# Patient Record
Sex: Female | Born: 1950 | Race: White | Hispanic: No | Marital: Married | Smoking: Former smoker
Health system: Southern US, Community
[De-identification: ages and names within clinical notes are randomized; demographics above are authoritative.]

## PROBLEM LIST (undated history)

## (undated) DIAGNOSIS — I10 Essential (primary) hypertension: Secondary | ICD-10-CM

## (undated) DIAGNOSIS — L409 Psoriasis, unspecified: Secondary | ICD-10-CM

## (undated) DIAGNOSIS — D126 Benign neoplasm of colon, unspecified: Secondary | ICD-10-CM

## (undated) DIAGNOSIS — Z8489 Family history of other specified conditions: Secondary | ICD-10-CM

## (undated) DIAGNOSIS — Q820 Hereditary lymphedema: Secondary | ICD-10-CM

## (undated) DIAGNOSIS — Z87898 Personal history of other specified conditions: Secondary | ICD-10-CM

## (undated) DIAGNOSIS — E119 Type 2 diabetes mellitus without complications: Secondary | ICD-10-CM

## (undated) DIAGNOSIS — M5136 Other intervertebral disc degeneration, lumbar region: Secondary | ICD-10-CM

## (undated) DIAGNOSIS — I83893 Varicose veins of bilateral lower extremities with other complications: Secondary | ICD-10-CM

## (undated) DIAGNOSIS — E785 Hyperlipidemia, unspecified: Secondary | ICD-10-CM

## (undated) DIAGNOSIS — I700 Atherosclerosis of aorta: Secondary | ICD-10-CM

## (undated) DIAGNOSIS — Z9842 Cataract extraction status, left eye: Secondary | ICD-10-CM

## (undated) DIAGNOSIS — J189 Pneumonia, unspecified organism: Secondary | ICD-10-CM

## (undated) DIAGNOSIS — K644 Residual hemorrhoidal skin tags: Secondary | ICD-10-CM

## (undated) DIAGNOSIS — M48061 Spinal stenosis, lumbar region without neurogenic claudication: Secondary | ICD-10-CM

## (undated) DIAGNOSIS — Z87442 Personal history of urinary calculi: Secondary | ICD-10-CM

## (undated) DIAGNOSIS — M1990 Unspecified osteoarthritis, unspecified site: Secondary | ICD-10-CM

## (undated) DIAGNOSIS — Z9889 Other specified postprocedural states: Secondary | ICD-10-CM

## (undated) DIAGNOSIS — R112 Nausea with vomiting, unspecified: Secondary | ICD-10-CM

## (undated) DIAGNOSIS — M51369 Other intervertebral disc degeneration, lumbar region without mention of lumbar back pain or lower extremity pain: Secondary | ICD-10-CM

## (undated) DIAGNOSIS — F419 Anxiety disorder, unspecified: Secondary | ICD-10-CM

## (undated) DIAGNOSIS — Z9841 Cataract extraction status, right eye: Secondary | ICD-10-CM

## (undated) DIAGNOSIS — F329 Major depressive disorder, single episode, unspecified: Secondary | ICD-10-CM

## (undated) DIAGNOSIS — K5790 Diverticulosis of intestine, part unspecified, without perforation or abscess without bleeding: Secondary | ICD-10-CM

## (undated) DIAGNOSIS — Z952 Presence of prosthetic heart valve: Secondary | ICD-10-CM

## (undated) DIAGNOSIS — F32A Depression, unspecified: Secondary | ICD-10-CM

## (undated) DIAGNOSIS — R7303 Prediabetes: Secondary | ICD-10-CM

## (undated) HISTORY — DX: Psoriasis, unspecified: L40.9

## (undated) HISTORY — DX: Essential (primary) hypertension: I10

## (undated) HISTORY — PX: CARDIAC VALVE REPLACEMENT: SHX585

---

## 1999-08-22 DIAGNOSIS — R112 Nausea with vomiting, unspecified: Secondary | ICD-10-CM

## 1999-08-22 DIAGNOSIS — Z9889 Other specified postprocedural states: Secondary | ICD-10-CM

## 1999-08-22 HISTORY — DX: Nausea with vomiting, unspecified: R11.2

## 1999-08-22 HISTORY — DX: Other specified postprocedural states: Z98.890

## 2004-10-19 ENCOUNTER — Ambulatory Visit: Payer: Self-pay | Admitting: Urology

## 2005-05-04 ENCOUNTER — Ambulatory Visit: Payer: Self-pay | Admitting: Internal Medicine

## 2006-09-20 ENCOUNTER — Ambulatory Visit: Payer: Self-pay | Admitting: Internal Medicine

## 2007-10-07 ENCOUNTER — Ambulatory Visit: Payer: Self-pay | Admitting: Internal Medicine

## 2011-08-22 DIAGNOSIS — Z960 Presence of urogenital implants: Secondary | ICD-10-CM

## 2011-08-22 HISTORY — PX: OTHER SURGICAL HISTORY: SHX169

## 2011-08-22 HISTORY — DX: Presence of urogenital implants: Z96.0

## 2012-08-21 HISTORY — PX: JOINT REPLACEMENT: SHX530

## 2012-08-21 HISTORY — PX: CATARACT EXTRACTION W/ INTRAOCULAR LENS IMPLANT: SHX1309

## 2012-08-21 HISTORY — PX: OTHER SURGICAL HISTORY: SHX169

## 2014-01-28 DIAGNOSIS — F419 Anxiety disorder, unspecified: Secondary | ICD-10-CM | POA: Insufficient documentation

## 2014-01-28 DIAGNOSIS — F32A Depression, unspecified: Secondary | ICD-10-CM | POA: Insufficient documentation

## 2014-01-28 DIAGNOSIS — L409 Psoriasis, unspecified: Secondary | ICD-10-CM | POA: Insufficient documentation

## 2014-06-16 DIAGNOSIS — R7303 Prediabetes: Secondary | ICD-10-CM | POA: Insufficient documentation

## 2014-08-18 ENCOUNTER — Ambulatory Visit: Payer: Self-pay | Admitting: Internal Medicine

## 2014-08-21 DIAGNOSIS — K5790 Diverticulosis of intestine, part unspecified, without perforation or abscess without bleeding: Secondary | ICD-10-CM

## 2014-08-21 HISTORY — DX: Diverticulosis of intestine, part unspecified, without perforation or abscess without bleeding: K57.90

## 2014-09-10 ENCOUNTER — Ambulatory Visit: Payer: Self-pay | Admitting: Internal Medicine

## 2014-10-16 ENCOUNTER — Ambulatory Visit: Payer: Self-pay | Admitting: Gastroenterology

## 2014-12-14 LAB — SURGICAL PATHOLOGY

## 2015-06-01 DIAGNOSIS — R928 Other abnormal and inconclusive findings on diagnostic imaging of breast: Secondary | ICD-10-CM | POA: Insufficient documentation

## 2015-06-01 DIAGNOSIS — R32 Unspecified urinary incontinence: Secondary | ICD-10-CM | POA: Insufficient documentation

## 2015-06-03 ENCOUNTER — Other Ambulatory Visit: Payer: Self-pay | Admitting: Internal Medicine

## 2015-06-03 DIAGNOSIS — R928 Other abnormal and inconclusive findings on diagnostic imaging of breast: Secondary | ICD-10-CM

## 2015-06-21 ENCOUNTER — Other Ambulatory Visit: Payer: Self-pay | Admitting: Internal Medicine

## 2015-06-21 ENCOUNTER — Ambulatory Visit
Admission: RE | Admit: 2015-06-21 | Discharge: 2015-06-21 | Disposition: A | Discharge status: Home / Self Care | Payer: Medicare Other | Source: Ambulatory Visit | Attending: Internal Medicine | Admitting: Internal Medicine

## 2015-06-21 DIAGNOSIS — R928 Other abnormal and inconclusive findings on diagnostic imaging of breast: Secondary | ICD-10-CM

## 2015-06-21 DIAGNOSIS — R921 Mammographic calcification found on diagnostic imaging of breast: Secondary | ICD-10-CM | POA: Insufficient documentation

## 2015-06-22 ENCOUNTER — Other Ambulatory Visit: Payer: Self-pay | Admitting: Internal Medicine

## 2015-06-23 ENCOUNTER — Other Ambulatory Visit: Payer: Self-pay | Admitting: Internal Medicine

## 2015-06-23 DIAGNOSIS — R921 Mammographic calcification found on diagnostic imaging of breast: Secondary | ICD-10-CM

## 2015-08-22 DIAGNOSIS — Q820 Hereditary lymphedema: Secondary | ICD-10-CM

## 2015-08-22 HISTORY — DX: Hereditary lymphedema: Q82.0

## 2015-12-20 ENCOUNTER — Ambulatory Visit: Payer: Medicare Other | Attending: Internal Medicine

## 2015-12-20 ENCOUNTER — Other Ambulatory Visit: Payer: Medicare Other

## 2016-01-07 ENCOUNTER — Ambulatory Visit
Admission: RE | Admit: 2016-01-07 | Discharge: 2016-01-07 | Disposition: A | Discharge status: Home / Self Care | Payer: Medicare Other | Source: Ambulatory Visit | Attending: Internal Medicine | Admitting: Internal Medicine

## 2016-01-07 ENCOUNTER — Other Ambulatory Visit: Payer: Self-pay | Admitting: Internal Medicine

## 2016-01-07 DIAGNOSIS — R921 Mammographic calcification found on diagnostic imaging of breast: Secondary | ICD-10-CM

## 2016-03-25 DIAGNOSIS — I83893 Varicose veins of bilateral lower extremities with other complications: Secondary | ICD-10-CM | POA: Insufficient documentation

## 2016-08-21 HISTORY — PX: PARS PLANA VITRECTOMY: SHX2166

## 2016-08-21 HISTORY — PX: EYE SURGERY: SHX253

## 2016-09-20 DIAGNOSIS — Z96659 Presence of unspecified artificial knee joint: Secondary | ICD-10-CM | POA: Insufficient documentation

## 2016-09-21 ENCOUNTER — Other Ambulatory Visit: Payer: Self-pay | Admitting: Internal Medicine

## 2016-09-26 ENCOUNTER — Other Ambulatory Visit: Payer: Self-pay | Admitting: Internal Medicine

## 2016-09-26 DIAGNOSIS — R921 Mammographic calcification found on diagnostic imaging of breast: Secondary | ICD-10-CM

## 2016-10-02 ENCOUNTER — Other Ambulatory Visit: Payer: Self-pay | Admitting: Internal Medicine

## 2016-10-02 DIAGNOSIS — E7800 Pure hypercholesterolemia, unspecified: Secondary | ICD-10-CM | POA: Insufficient documentation

## 2016-10-02 DIAGNOSIS — R921 Mammographic calcification found on diagnostic imaging of breast: Secondary | ICD-10-CM

## 2016-10-23 ENCOUNTER — Telehealth (INDEPENDENT_AMBULATORY_CARE_PROVIDER_SITE_OTHER): Payer: Self-pay

## 2016-10-23 NOTE — Telephone Encounter (Signed)
Patient called and said she had a total knee replacement and wanted to know should she use her lymph pump during this time. Per Hezzie Bump the P.A. She can wear compression hose and elevate above heart level.

## 2016-10-27 ENCOUNTER — Ambulatory Visit: Payer: Medicare Other

## 2016-10-27 HISTORY — PX: JOINT REPLACEMENT: SHX530

## 2016-10-30 ENCOUNTER — Ambulatory Visit (INDEPENDENT_AMBULATORY_CARE_PROVIDER_SITE_OTHER): Payer: Self-pay | Admitting: Vascular Surgery

## 2016-11-27 ENCOUNTER — Encounter
Admission: RE | Admit: 2016-11-27 | Discharge: 2016-11-27 | Disposition: A | Discharge status: Home / Self Care | Payer: Medicare Other | Source: Ambulatory Visit | Attending: Unknown Physician Specialty | Admitting: Unknown Physician Specialty

## 2016-11-27 ENCOUNTER — Other Ambulatory Visit: Payer: Medicare Other

## 2016-11-27 DIAGNOSIS — F4321 Adjustment disorder with depressed mood: Secondary | ICD-10-CM | POA: Diagnosis not present

## 2016-11-27 DIAGNOSIS — E785 Hyperlipidemia, unspecified: Secondary | ICD-10-CM | POA: Diagnosis not present

## 2016-11-27 DIAGNOSIS — Z87891 Personal history of nicotine dependence: Secondary | ICD-10-CM | POA: Diagnosis not present

## 2016-11-27 DIAGNOSIS — I10 Essential (primary) hypertension: Secondary | ICD-10-CM | POA: Diagnosis not present

## 2016-11-27 DIAGNOSIS — M24661 Ankylosis, right knee: Secondary | ICD-10-CM | POA: Diagnosis not present

## 2016-11-27 DIAGNOSIS — Z79899 Other long term (current) drug therapy: Secondary | ICD-10-CM | POA: Diagnosis not present

## 2016-11-27 DIAGNOSIS — F419 Anxiety disorder, unspecified: Secondary | ICD-10-CM | POA: Diagnosis not present

## 2016-11-27 DIAGNOSIS — Z96653 Presence of artificial knee joint, bilateral: Secondary | ICD-10-CM | POA: Diagnosis not present

## 2016-11-27 DIAGNOSIS — Z7901 Long term (current) use of anticoagulants: Secondary | ICD-10-CM | POA: Diagnosis not present

## 2016-11-27 HISTORY — DX: Anxiety disorder, unspecified: F41.9

## 2016-11-27 HISTORY — DX: Personal history of urinary calculi: Z87.442

## 2016-11-27 HISTORY — DX: Psoriasis, unspecified: L40.9

## 2016-11-27 HISTORY — DX: Nausea with vomiting, unspecified: R11.2

## 2016-11-27 HISTORY — DX: Essential (primary) hypertension: I10

## 2016-11-27 HISTORY — DX: Hereditary lymphedema: Q82.0

## 2016-11-27 HISTORY — DX: Diverticulosis of intestine, part unspecified, without perforation or abscess without bleeding: K57.90

## 2016-11-27 HISTORY — DX: Hyperlipidemia, unspecified: E78.5

## 2016-11-27 HISTORY — DX: Benign neoplasm of colon, unspecified: D12.6

## 2016-11-27 HISTORY — DX: Residual hemorrhoidal skin tags: K64.4

## 2016-11-27 HISTORY — DX: Unspecified osteoarthritis, unspecified site: M19.90

## 2016-11-27 HISTORY — DX: Prediabetes: R73.03

## 2016-11-27 HISTORY — DX: Family history of other specified conditions: Z84.89

## 2016-11-27 HISTORY — DX: Other specified postprocedural states: Z98.890

## 2016-11-27 LAB — BASIC METABOLIC PANEL
Anion gap: 8 (ref 5–15)
BUN: 12 mg/dL (ref 6–20)
CO2: 25 mmol/L (ref 22–32)
Calcium: 8.8 mg/dL — ABNORMAL LOW (ref 8.9–10.3)
Chloride: 106 mmol/L (ref 101–111)
Creatinine, Ser: 0.78 mg/dL (ref 0.44–1.00)
GFR calc Af Amer: >60 mL/min (ref >60)
GFR calc non Af Amer: >60 mL/min (ref >60)
Glucose, Bld: 133 mg/dL — ABNORMAL HIGH (ref 65–99)
Potassium: 3.8 mmol/L (ref 3.5–5.1)
Sodium: 139 mmol/L (ref 135–145)

## 2016-11-27 NOTE — Patient Instructions (Signed)
  Your procedure is scheduled on:Wednesday April 11 , 2018. Report to Same Day Surgery. To find out your arrival time please call [phone] between 1PM - 3PM on Tuesday November 28, 2016.  Remember: Instructions that are not followed completely may result in serious medical risk, up to and including death, or upon the discretion of your surgeon and anesthesiologist your surgery may need to be rescheduled.    _x___ 1. Do not eat food or drink liquids after midnight. No gum chewing or hard candies.     __x__ 2. No Alcohol for 24 hours before or after surgery.   ____ 3. Bring all medications with you on the day of surgery if instructed.    __x__ 4. Notify your doctor if there is any change in your medical condition     (cold, fever, infections).    _____ 5. No smoking 24 hours prior to surgery.     Do not wear jewelry, make-up, hairpins, clips or nail polish.  Do not wear lotions, powders, or perfumes.   Do not shave 48 hours prior to surgery. Men may shave face and neck.  Do not bring valuables to the hospital.    [facility] is not responsible for any belongings or valuables.               Contacts, dentures or bridgework may not be worn into surgery.  Leave your suitcase in the car. After surgery it may be brought to your room.  For patients admitted to the hospital, discharge time is determined by your treatment team.   Patients discharged the day of surgery will not be allowed to drive home.    Please read over the following fact sheets that you were given:   [facility] Preparing for Surgery  __x__ Take these medicines the morning of surgery with A SIP OF WATER:    1. lisinopril (PRINIVIL,ZESTRIL)  2. metoprolol succinate (TOPROL-XL)  3. oxybutynin (DITROPAN)  4. venlafaxine (EFFEXOR)   ____ Fleet Enema (as directed)   ____ Use CHG Soap as directed on instruction sheet  ____ Use inhalers on the day of surgery and bring to hospital day of surgery  ____ Stop metformin  2 days prior to surgery    ____ Take 1/2 of usual insulin dose the night before surgery and none on the morning of  surgery.   ____ Stop Coumadin/Plavix/aspirin on does not apply.  __x__ Stop Anti-inflammatories such as Advil, Aleve, Ibuprofen, Motrin, Naproxen, Naprosyn, Goodies powders or aspirin products. OK to take Tylenol or  oxycodone.   ____ Stop supplements until after surgery.    ____ Bring C-Pap to the hospital.

## 2016-11-28 MED ORDER — FAMOTIDINE 20 MG PO TABS: 20.0000 mg | ORAL_TABLET | Freq: Once | ORAL | Status: DC

## 2016-11-29 ENCOUNTER — Encounter: Payer: Self-pay | Admitting: *Deleted

## 2016-11-29 ENCOUNTER — Ambulatory Visit
Admission: RE | Admit: 2016-11-29 | Discharge: 2016-11-29 | Disposition: A | Discharge status: Home / Self Care | Payer: Medicare Other | Source: Ambulatory Visit | Attending: Unknown Physician Specialty | Admitting: Unknown Physician Specialty

## 2016-11-29 ENCOUNTER — Encounter
Admission: RE | Disposition: A | Discharge status: Home / Self Care | Payer: Self-pay | Source: Ambulatory Visit | Attending: Unknown Physician Specialty

## 2016-11-29 ENCOUNTER — Ambulatory Visit: Payer: Medicare Other | Admitting: Anesthesiology

## 2016-11-29 DIAGNOSIS — M24661 Ankylosis, right knee: Secondary | ICD-10-CM | POA: Diagnosis not present

## 2016-11-29 DIAGNOSIS — Z7901 Long term (current) use of anticoagulants: Secondary | ICD-10-CM | POA: Insufficient documentation

## 2016-11-29 DIAGNOSIS — Z79899 Other long term (current) drug therapy: Secondary | ICD-10-CM | POA: Insufficient documentation

## 2016-11-29 DIAGNOSIS — E785 Hyperlipidemia, unspecified: Secondary | ICD-10-CM | POA: Insufficient documentation

## 2016-11-29 DIAGNOSIS — Z87891 Personal history of nicotine dependence: Secondary | ICD-10-CM | POA: Insufficient documentation

## 2016-11-29 DIAGNOSIS — F4321 Adjustment disorder with depressed mood: Secondary | ICD-10-CM | POA: Insufficient documentation

## 2016-11-29 DIAGNOSIS — F419 Anxiety disorder, unspecified: Secondary | ICD-10-CM | POA: Insufficient documentation

## 2016-11-29 DIAGNOSIS — I10 Essential (primary) hypertension: Secondary | ICD-10-CM | POA: Insufficient documentation

## 2016-11-29 DIAGNOSIS — Z96653 Presence of artificial knee joint, bilateral: Secondary | ICD-10-CM | POA: Insufficient documentation

## 2016-11-29 HISTORY — PX: KNEE CLOSED REDUCTION: SHX995

## 2016-11-29 SURGERY — MANIPULATION, KNEE, CLOSED
Anesthesia: General | Site: Knee | Laterality: Right

## 2016-11-29 MED ORDER — FENTANYL CITRATE (PF) 100 MCG/2ML IJ SOLN
INTRAMUSCULAR | Status: AC
  Administered 2016-11-29: 25 ug via INTRAVENOUS
  Filled 2016-11-29: qty 2

## 2016-11-29 MED ORDER — FENTANYL CITRATE (PF) 100 MCG/2ML IJ SOLN
INTRAMUSCULAR | Status: AC
  Filled 2016-11-29: qty 2

## 2016-11-29 MED ORDER — LIDOCAINE HCL (CARDIAC) 20 MG/ML IV SOLN
INTRAVENOUS | Status: DC | PRN
  Administered 2016-11-29: 50 mg via INTRAVENOUS

## 2016-11-29 MED ORDER — PROPOFOL 10 MG/ML IV BOLUS
INTRAVENOUS | Status: DC | PRN
  Administered 2016-11-29: 140 mg via INTRAVENOUS

## 2016-11-29 MED ORDER — LACTATED RINGERS IV SOLN
INTRAVENOUS | Status: DC | PRN
  Administered 2016-11-29: 09:00:00 via INTRAVENOUS

## 2016-11-29 MED ORDER — MIDAZOLAM HCL 2 MG/2ML IJ SOLN
INTRAMUSCULAR | Status: DC | PRN
  Administered 2016-11-29: 2 mg via INTRAVENOUS

## 2016-11-29 MED ORDER — MIDAZOLAM HCL 2 MG/2ML IJ SOLN
INTRAMUSCULAR | Status: AC
  Filled 2016-11-29: qty 2

## 2016-11-29 MED ORDER — LACTATED RINGERS IV SOLN
INTRAVENOUS | Status: DC
  Administered 2016-11-29: 09:00:00 via INTRAVENOUS

## 2016-11-29 MED ORDER — NORCO 5-325 MG PO TABS: 1.0000 | ORAL_TABLET | Freq: Four times a day (QID) | ORAL | 0 refills | Status: DC | PRN

## 2016-11-29 MED ORDER — PROPOFOL 10 MG/ML IV BOLUS
INTRAVENOUS | Status: AC
  Filled 2016-11-29: qty 20

## 2016-11-29 MED ORDER — FENTANYL CITRATE (PF) 100 MCG/2ML IJ SOLN
INTRAMUSCULAR | Status: DC | PRN
Start: 2016-11-29 — End: 2016-11-29
  Administered 2016-11-29 (×2): 50 ug via INTRAVENOUS

## 2016-11-29 MED ORDER — ONDANSETRON HCL 4 MG/2ML IJ SOLN: 4.0000 mg | Freq: Once | INTRAMUSCULAR | Status: DC | PRN

## 2016-11-29 MED ORDER — FENTANYL CITRATE (PF) 100 MCG/2ML IJ SOLN
25.0000 ug | INTRAMUSCULAR | Status: DC | PRN
  Administered 2016-11-29 (×3): 25 ug via INTRAVENOUS

## 2016-11-29 SURGICAL SUPPLY — 1 items: WRAP KNEE W/COLD PACKS 25.5X14 (SOFTGOODS) ×2 IMPLANT

## 2016-11-29 NOTE — Transfer of Care (Signed)
Immediate Anesthesia Transfer of Care Note  Patient: Brandy Oneill  Procedure(s) Performed: Procedure(s): CLOSED MANIPULATION KNEE (Right)  Patient Location: PACU  Anesthesia Type:General  Level of Consciousness: awake  Airway & Oxygen Therapy: Patient Spontanous Breathing and Patient connected to face mask oxygen  Post-op Assessment: Report given to RN  Post vital signs: Reviewed and stable  Last Vitals:  Vitals:   11/29/16 0821  BP: (!) 151/83  Pulse: 85  Resp: 18  Temp: 36.6 C    Last Pain:  Vitals:   11/29/16 0821  TempSrc: Oral         Complications: No apparent anesthesia complications

## 2016-11-29 NOTE — Anesthesia Preprocedure Evaluation (Signed)
Anesthesia Evaluation  Patient identified by MRN, date of birth, ID band Patient awake    Reviewed: Allergy & Precautions, NPO status , Patient's Chart, lab work & pertinent test results, reviewed documented beta blocker date and time   History of Anesthesia Complications (+) PONV, Family history of anesthesia reaction and history of anesthetic complications  Airway Mallampati: III  TM Distance: <3 FB     Dental  (+) Caps, Chipped   Pulmonary former smoker,    Pulmonary exam normal        Cardiovascular hypertension, Pt. on medications and Pt. on home beta blockers Normal cardiovascular exam     Neuro/Psych PSYCHIATRIC DISORDERS Anxiety negative neurological ROS     GI/Hepatic Neg liver ROS, Colon polyp Diverticulosis    Endo/Other  negative endocrine ROS  Renal/GU negative Renal ROS     Musculoskeletal  (+) Arthritis ,   Abdominal Normal abdominal exam  (+)   Peds  Hematology negative hematology ROS (+)   Anesthesia Other Findings Past Medical History: No date: Anxiety No date: Arthritis 2016: Diverticulosis No date: External hemorrhoid No date: Family history of adverse reaction to anesthes*     Comment: sister becomes disoriented 2017: Hereditary lymphedema of legs     Comment: bilaterally No date: History of kidney stones No date: Hyperlipidemia No date: Hypertension 2001: PONV (postoperative nausea and vomiting)     Comment: kidney stone No date: Pre-diabetes No date: Psoriasis No date: Tubular adenoma of colon  Reproductive/Obstetrics                             Anesthesia Physical Anesthesia Plan  ASA: III  Anesthesia Plan: General   Post-op Pain Management:    Induction: Intravenous  Airway Management Planned: LMA  Additional Equipment:   Intra-op Plan:   Post-operative Plan: Extubation in OR  Informed Consent: I have reviewed the patients History and  Physical, chart, labs and discussed the procedure including the risks, benefits and alternatives for the proposed anesthesia with the patient or authorized representative who has indicated his/her understanding and acceptance.     Plan Discussed with: CRNA and Surgeon  Anesthesia Plan Comments:         Anesthesia Quick Evaluation

## 2016-11-29 NOTE — Anesthesia Post-op Follow-up Note (Cosign Needed)
Anesthesia QCDR form completed.        

## 2016-11-29 NOTE — Discharge Instructions (Signed)
Ice pack  Elevation  RTC in about 2 weeks  Resume PT  General Anesthesia, Adult, Care After These instructions provide you with information about caring for yourself after your procedure. Your health care provider may also give you more specific instructions. Your treatment has been planned according to current medical practices, but problems sometimes occur. Call your health care provider if you have any problems or questions after your procedure. What can I expect after the procedure? After the procedure, it is common to have:  Vomiting.  A sore throat.  Mental slowness. It is common to feel:  Nauseous.  Cold or shivery.  Sleepy.  Tired.  Sore or achy, even in parts of your body where you did not have surgery. Follow these instructions at home: For at least 24 hours after the procedure:   Do not:  Participate in activities where you could fall or become injured.  Drive.  Use heavy machinery.  Drink alcohol.  Take sleeping pills or medicines that cause drowsiness.  Make important decisions or sign legal documents.  Take care of children on your own.  Rest. Eating and drinking   If you vomit, drink water, juice, or soup when you can drink without vomiting.  Drink enough fluid to keep your urine clear or pale yellow.  Make sure you have little or no nausea before eating solid foods.  Follow the diet recommended by your health care provider. General instructions   Have a responsible adult stay with you until you are awake and alert.  Return to your normal activities as told by your health care provider. Ask your health care provider what activities are safe for you.  Take over-the-counter and prescription medicines only as told by your health care provider.  If you smoke, do not smoke without supervision.  Keep all follow-up visits as told by your health care provider. This is important. Contact a health care provider if:  You continue to have  nausea or vomiting at home, and medicines are not helpful.  You cannot drink fluids or start eating again.  You cannot urinate after 8-12 hours.  You develop a skin rash.  You have fever.  You have increasing redness at the site of your procedure. Get help right away if:  You have difficulty breathing.  You have chest pain.  You have unexpected bleeding.  You feel that you are having a life-threatening or urgent problem. This information is not intended to replace advice given to you by your health care provider. Make sure you discuss any questions you have with your health care provider. Document Released: 11/13/2000 Document Revised: 01/10/2016 Document Reviewed: 07/22/2015 Elsevier Interactive Patient Education  2017 [employer].

## 2016-11-29 NOTE — Anesthesia Procedure Notes (Signed)
Procedure Name: LMA Insertion Performed by: Whitnie Deleon Pre-anesthesia Checklist: Patient identified, Emergency Drugs available, Suction available, Timeout performed and Patient being monitored Patient Re-evaluated:Patient Re-evaluated prior to inductionOxygen Delivery Method: Circle system utilized Preoxygenation: Pre-oxygenation with 100% oxygen Intubation Type: IV induction LMA: LMA inserted LMA Size: 3.5 Number of attempts: 1 Placement Confirmation: breath sounds checked- equal and bilateral,  CO2 detector and positive ETCO2 Tube secured with: Tape

## 2016-11-29 NOTE — Anesthesia Postprocedure Evaluation (Signed)
Anesthesia Post Note  Patient: Brandy Oneill  Procedure(s) Performed: Procedure(s) (LRB): CLOSED MANIPULATION KNEE (Right)  Patient location during evaluation: PACU Anesthesia Type: General Level of consciousness: awake and alert and oriented Pain management: pain level controlled Vital Signs Assessment: post-procedure vital signs reviewed and stable Respiratory status: spontaneous breathing Cardiovascular status: blood pressure returned to baseline Anesthetic complications: no     Last Vitals:  Vitals:   11/29/16 0940 11/29/16 0945  BP:  (!) 146/85  Pulse: 74 75  Resp: 20 (!) 21  Temp:      Last Pain:  Vitals:   11/29/16 0945  TempSrc:   PainSc: 4                  Lakashia Collison

## 2016-11-29 NOTE — H&P (Signed)
  H and P reviewed. No changes. Uploaded at later date. 

## 2016-11-29 NOTE — Op Note (Signed)
            11/29/2016  9:27 AM  PATIENT:  Brandy Oneill  66 y.o. female  PRE-OPERATIVE DIAGNOSIS:  S/P right total knee replacement with arthrofibrosis   POST-OPERATIVE DIAGNOSIS:  S/P right total knee replacement with arthrofibrosis   PROCEDURE:  Procedure(s): CLOSED MANIPULATION KNEE (Right)  SURGEON:   Mariel Kansky., MD  ANESTHESIA: Gen.  IMPLANTS: None  HISTORY: The patient had a robotic assisted total knee replacement on the right about 1 month ago. She was brought in for manipulation of her right knee under anesthesia because of failure to achieve a functional range of motion.  OP NOTE: Patient was taken to the operating room where satisfactory general anesthesia was achieved. I manipulated her right knee without difficulty. At the start of the procedure she lacked a few degrees of full extension and had flexion to about 95. At the conclusion of the right knee manipulation I was able to achieve full extension with flexion close to 120. Patient was then awakened and transferred to her stretcher bed. She was taken to the recovery room in satisfactory condition.

## 2017-02-01 ENCOUNTER — Ambulatory Visit (INDEPENDENT_AMBULATORY_CARE_PROVIDER_SITE_OTHER): Payer: Medicare Other | Admitting: Vascular Surgery

## 2017-02-01 ENCOUNTER — Encounter (INDEPENDENT_AMBULATORY_CARE_PROVIDER_SITE_OTHER): Payer: Self-pay | Admitting: Vascular Surgery

## 2017-02-01 DIAGNOSIS — I890 Lymphedema, not elsewhere classified: Secondary | ICD-10-CM

## 2017-02-01 DIAGNOSIS — I872 Venous insufficiency (chronic) (peripheral): Secondary | ICD-10-CM

## 2017-02-01 DIAGNOSIS — M7989 Other specified soft tissue disorders: Secondary | ICD-10-CM

## 2017-02-04 DIAGNOSIS — M7989 Other specified soft tissue disorders: Secondary | ICD-10-CM | POA: Insufficient documentation

## 2017-02-04 DIAGNOSIS — I872 Venous insufficiency (chronic) (peripheral): Secondary | ICD-10-CM | POA: Insufficient documentation

## 2017-02-04 DIAGNOSIS — I890 Lymphedema, not elsewhere classified: Secondary | ICD-10-CM | POA: Insufficient documentation

## 2017-02-04 NOTE — Progress Notes (Signed)
MRN : [id number]  Brandy Oneill is a 66 y.o. (September 10, 1950) female who presents with chief complaint of  Chief Complaint  Patient presents with  . Follow-up  .  History of Present Illness: The patient returns to the office for followup evaluation regarding leg swelling.  The swelling has improved quite a bit and the pain associated with swelling has decreased substantially. There have not been any interval development of a ulcerations or wounds.  Since the previous visit the patient has been wearing graduated compression stockings and has noted little significant improvement in the lymphedema. The patient has been using compression routinely morning until night.  The patient also states elevation during the day and exercise is being done too.  There has been a right TKR in the interim and she has done well.    Current Meds  Medication Sig  . ELIQUIS 2.5 MG TABS tablet Take 2.5 mg by mouth 2 (two) times daily.  Marland Kitchen lisinopril (PRINIVIL,ZESTRIL) 40 MG tablet Take 40 mg by mouth daily.  . metoprolol succinate (TOPROL-XL) 25 MG 24 hr tablet Take 25 mg by mouth daily.  . Multiple Vitamin (MULTIVITAMIN WITH MINERALS) TABS tablet Take 1 tablet by mouth daily. Centrum for Women  . oxybutynin (DITROPAN) 5 MG tablet Take 5 mg by mouth 2 (two) times daily.  . pravastatin (PRAVACHOL) 10 MG tablet Take 10 mg by mouth every evening.  . venlafaxine (EFFEXOR) 37.5 MG tablet Take 37.5 mg by mouth daily.    Past Medical History:  Diagnosis Date  . Anxiety   . Arthritis   . Diverticulosis 2016  . External hemorrhoid   . Family history of adverse reaction to anesthesia    sister becomes disoriented  . 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 History of kidney stones   . Hyperlipidemia   . Hypertension   . PONV (postoperative nausea and vomiting) 2001   kidney stone  . Pre-diabetes   . Psoriasis   . Tubular adenoma of colon     Past Surgical History:  Procedure Laterality  Date  . bladder stimulater  2014  . CATARACT EXTRACTION W/ INTRAOCULAR LENS IMPLANT Bilateral 2014   one eye done then the other eye done a month later  . EYE SURGERY Right 2018   macular hole  . JOINT REPLACEMENT Right 10/27/2016  . JOINT REPLACEMENT Left 2014  . KNEE CLOSED REDUCTION Right 11/29/2016   Procedure: CLOSED MANIPULATION KNEE;  Surgeon: Leanor Kail, MD;  Location: [facility] ORS;  Service: Orthopedics;  Laterality: Right;    Social History Social History  Substance Use Topics  . Smoking status: Former Smoker    Quit date: 11/27/2012  . Smokeless tobacco: Never Used  . Alcohol use Not on file     Comment: rare 1 drink per month    Family History Family History  Problem Relation Age of Onset  . Breast cancer Neg Hx     No Known Allergies   REVIEW OF SYSTEMS (Negative unless checked)  Constitutional: [] Weight loss  [] Fever  [] Chills Cardiac: [] Chest pain   [] Chest pressure   [] Palpitations   [] Shortness of breath when laying flat   [] Shortness of breath with exertion. Vascular:  [] Pain in legs with walking   [] Pain in legs at rest  [] History of DVT   [] Phlebitis   [x] Swelling in legs   [x] Varicose veins   [] Non-healing ulcers Pulmonary:   [] Uses home oxygen   [] Productive cough   [] Hemoptysis   [] Wheeze  []   COPD   [] Asthma Neurologic:  [] Dizziness   [] Seizures   [] History of stroke   [] History of TIA  [] Aphasia   [] Vissual changes   [] Weakness or numbness in arm   [] Weakness or numbness in leg Musculoskeletal:   [x] Joint swelling   [x] Joint pain   [] Low back pain Hematologic:  [] Easy bruising  [] Easy bleeding   [] Hypercoagulable state   [] Anemic Gastrointestinal:  [] Diarrhea   [] Vomiting  [] Gastroesophageal reflux/heartburn   [] Difficulty swallowing. Genitourinary:  [] Chronic kidney disease   [] Difficult urination  [] Frequent urination   [] Blood in urine Skin:  [] Rashes   [] Ulcers  Psychological:  [] History of anxiety   []  History of major depression.  Physical  Examination  Vitals:   02/01/17 1553  BP: (!) 145/78  Pulse: 84  Resp: 16  Weight: 238 lb (108 kg)   Body mass index is 40.85 kg/m. Gen: WD/WN, NAD Head: [place]/AT, No temporalis wasting.  Ear/Nose/Throat: Hearing grossly intact, nares w/o erythema or drainage Eyes: PER, EOMI, sclera nonicteric.  Neck: Supple, no large masses.   Pulmonary:  Good air movement, no audible wheezing bilaterally, no use of accessory muscles.  Cardiac: RRR, no JVD Vascular: Varicosities present extensively 2 mm bilaterally.  Mild venous stasis changes to the legs bilaterally.  2+ soft pitting edema Vessel Right Left  Radial Palpable Palpable  PT Palpable Palpable  DP Palpable Palpable  Gastrointestinal: Non-distended. No guarding/no peritoneal signs.  Musculoskeletal: M/S 5/5 throughout.  No deformity or atrophy.  Neurologic: CN 2-12 intact. Symmetrical.  Speech is fluent. Motor exam as listed above. Psychiatric: Judgment intact, Mood & affect appropriate for pt's clinical situation. Dermatologic: No rashes or ulcers noted.  No changes consistent with cellulitis. Lymph : No lichenification or skin changes of chronic lymphedema.  CBC No results found for: WBC, HGB, HCT, MCV, PLT  BMET    Component Value Date/Time   NA 139 11/27/2016 1417   K 3.8 11/27/2016 1417   CL 106 11/27/2016 1417   CO2 25 11/27/2016 1417   GLUCOSE 133 (H) 11/27/2016 1417   BUN 12 11/27/2016 1417   CREATININE 0.78 11/27/2016 1417   CALCIUM 8.8 (L) 11/27/2016 1417   GFRNONAA >60 11/27/2016 1417   GFRAA >60 11/27/2016 1417   CrCl cannot be calculated (Patient's most recent lab result is older than the maximum 21 days allowed.).  COAG No results found for: INR, PROTIME  Radiology No results found.  Assessment/Plan 1. Lymphedema No surgery or intervention at this point in time.  I have reviewed my discussion with the patient regarding venous insufficiency and why it causes symptoms. I have discussed with the patient  the chronic skin changes that accompany venous insufficiency and the long term sequela such as ulceration. Patient will contnue wearing graduated compression stockings on a daily basis, as this has provided excellent control of his edema. The patient will put the stockings on first thing in the morning and removing them in the evening. The patient is reminded not to sleep in the stockings.  In addition, behavioral modification including elevation during the day will be initiated. Exercise is strongly encouraged.  Given the patient's good control and lack of any problems regarding the venous insufficiency and lymphedema a lymph pump in not need at this time.  The patient will follow up with me PRN should anything change.  The patient voices agreement with this plan.   2. Chronic venous insufficiency No surgery or intervention at this point in time.    I have had  a long discussion with the patient regarding venous insufficiency and why it  causes symptoms. I have discussed with the patient the chronic skin changes that accompany venous insufficiency and the long term sequela such as infection and ulceration.  Patient will begin wearing graduated compression stockings class 1 (20-30 mmHg) or compression wraps on a daily basis a prescription was given. The patient will put the stockings on first thing in the morning and removing them in the evening. The patient is instructed specifically not to sleep in the stockings.    In addition, behavioral modification including several periods of elevation of the lower extremities during the day will be continued. I have demonstrated that proper elevation is a position with the ankles at heart level.  The patient is instructed to begin routine exercise, especially walking on a daily basis  Patient's duplex ultrasound of the venous system shows that DVT and reflux is not present.  Following the review of the ultrasound the patient will follow up in 2-3 months to  reassess the degree of swelling and the control that graduated compression stockings or compression wraps  is offering.   The patient can be assessed for a Lymph Pump at that time  3. Swelling of limb See # 1&2    Hortencia Pilar, MD  02/04/2017 5:47 PM

## 2017-02-28 ENCOUNTER — Ambulatory Visit
Admission: RE | Admit: 2017-02-28 | Discharge: 2017-02-28 | Disposition: A | Discharge status: Home / Self Care | Payer: Medicare Other | Source: Ambulatory Visit | Attending: Internal Medicine | Admitting: Internal Medicine

## 2017-02-28 DIAGNOSIS — R921 Mammographic calcification found on diagnostic imaging of breast: Secondary | ICD-10-CM | POA: Diagnosis not present

## 2017-09-17 DIAGNOSIS — R739 Hyperglycemia, unspecified: Secondary | ICD-10-CM | POA: Insufficient documentation

## 2017-09-17 DIAGNOSIS — Z87898 Personal history of other specified conditions: Secondary | ICD-10-CM | POA: Insufficient documentation

## 2018-02-04 ENCOUNTER — Ambulatory Visit (INDEPENDENT_AMBULATORY_CARE_PROVIDER_SITE_OTHER): Payer: Medicare Other | Admitting: Vascular Surgery

## 2018-02-04 ENCOUNTER — Encounter (INDEPENDENT_AMBULATORY_CARE_PROVIDER_SITE_OTHER): Payer: Self-pay | Admitting: Vascular Surgery

## 2018-02-04 VITALS — BP 135/73 | HR 84 | Ht 63.5 in | Wt 242.4 lb

## 2018-02-04 DIAGNOSIS — M159 Polyosteoarthritis, unspecified: Secondary | ICD-10-CM

## 2018-02-04 DIAGNOSIS — M150 Primary generalized (osteo)arthritis: Secondary | ICD-10-CM

## 2018-02-04 DIAGNOSIS — I890 Lymphedema, not elsewhere classified: Secondary | ICD-10-CM | POA: Diagnosis not present

## 2018-02-04 DIAGNOSIS — M1990 Unspecified osteoarthritis, unspecified site: Secondary | ICD-10-CM | POA: Insufficient documentation

## 2018-02-04 DIAGNOSIS — I872 Venous insufficiency (chronic) (peripheral): Secondary | ICD-10-CM | POA: Diagnosis not present

## 2018-02-04 DIAGNOSIS — M8949 Other hypertrophic osteoarthropathy, multiple sites: Secondary | ICD-10-CM

## 2018-02-04 NOTE — Progress Notes (Signed)
MRN : [id number]  Brandy Oneill is a 67 y.o. (March 27, 1951) female who presents with chief complaint of  Chief Complaint  Patient presents with  . Follow-up    one year.   Marland Kitchen  History of Present Illness:  The patient returns to the office for followup evaluation regarding leg swelling.  The swelling has improved quite a bit and the pain associated with swelling has decreased substantially. There have not been any interval development of a ulcerations or wounds.  Since the previous visit the patient has been wearing graduated compression stockings and has noted little significant improvement in the lymphedema. The patient has been using compression routinely morning until night.  The patient also states elevation during the day and exercise is being done too.  There has bilateral TKR and she has done well.     Current Meds  Medication Sig  . fluticasone (FLONASE) 50 MCG/ACT nasal spray SHAKE LQ AND U 1 SPR IEN QD  . lisinopril (PRINIVIL,ZESTRIL) 40 MG tablet Take 40 mg by mouth daily.  . metoprolol succinate (TOPROL-XL) 25 MG 24 hr tablet Take 25 mg by mouth daily.  . Multiple Vitamin (MULTIVITAMIN WITH MINERALS) TABS tablet Take 1 tablet by mouth daily. Centrum for Women  . oxybutynin (DITROPAN) 5 MG tablet Take 5 mg by mouth 2 (two) times daily.  . pravastatin (PRAVACHOL) 10 MG tablet Take 10 mg by mouth every evening.  . venlafaxine (EFFEXOR) 37.5 MG tablet Take 37.5 mg by mouth daily.    Past Medical History:  Diagnosis Date  . Anxiety   . Arthritis   . Diverticulosis 2016  . External hemorrhoid   . Family history of adverse reaction to anesthesia    sister becomes disoriented  . 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 History of kidney stones   . Hyperlipidemia   . Hypertension   . PONV (postoperative nausea and vomiting) 2001   kidney stone  . Pre-diabetes   . Psoriasis   . Tubular adenoma of colon     Past Surgical History:  Procedure  Laterality Date  . bladder stimulater  2014  . CATARACT EXTRACTION W/ INTRAOCULAR LENS IMPLANT Bilateral 2014   one eye done then the other eye done a month later  . EYE SURGERY Right 2018   macular hole  . JOINT REPLACEMENT Right 10/27/2016  . JOINT REPLACEMENT Left 2014  . KNEE CLOSED REDUCTION Right 11/29/2016   Procedure: CLOSED MANIPULATION KNEE;  Surgeon: Leanor Kail, MD;  Location: [facility] ORS;  Service: Orthopedics;  Laterality: Right;    Social History Social History   Tobacco Use  . Smoking status: Former Smoker    Last attempt to quit: 11/27/2012    Years since quitting: 5.1  . Smokeless tobacco: Never Used  Substance Use Topics  . Alcohol use: Not on file    Comment: rare 1 drink per month  . Drug use: No    Family History Family History  Problem Relation Age of Onset  . Breast cancer Neg Hx     Allergies  Allergen Reactions  . Kiwi Extract      REVIEW OF SYSTEMS (Negative unless checked)  Constitutional: [] Weight loss  [] Fever  [] Chills Cardiac: [] Chest pain   [] Chest pressure   [] Palpitations   [] Shortness of breath when laying flat   [] Shortness of breath with exertion. Vascular:  [] Pain in legs with walking   [] Pain in legs at rest  [] History of DVT   [] Phlebitis   [  x]Swelling in legs   [] Varicose veins   [] Non-healing ulcers Pulmonary:   [] Uses home oxygen   [] Productive cough   [] Hemoptysis   [] Wheeze  [] COPD   [] Asthma Neurologic:  [] Dizziness   [] Seizures   [] History of stroke   [] History of TIA  [] Aphasia   [] Vissual changes   [] Weakness or numbness in arm   [] Weakness or numbness in leg Musculoskeletal:   [x] Joint swelling   [x] Joint pain   [] Low back pain Hematologic:  [] Easy bruising  [] Easy bleeding   [] Hypercoagulable state   [] Anemic Gastrointestinal:  [] Diarrhea   [] Vomiting  [] Gastroesophageal reflux/heartburn   [] Difficulty swallowing. Genitourinary:  [] Chronic kidney disease   [] Difficult urination  [] Frequent urination   [] Blood in  urine Skin:  [] Rashes   [] Ulcers  Psychological:  [] History of anxiety   []  History of major depression.  Physical Examination  Vitals:   02/04/18 1500  BP: 135/73  Pulse: 84  Weight: 242 lb 6.4 oz (110 kg)  Height: 5' 3.5" (1.613 m)   Body mass index is 42.27 kg/m. Gen: WD/WN, NAD Head: [place]/AT, No temporalis wasting.  Ear/Nose/Throat: Hearing grossly intact, nares w/o erythema or drainage Eyes: PER, EOMI, sclera nonicteric.  Neck: Supple, no large masses.   Pulmonary:  Good air movement, no audible wheezing bilaterally, no use of accessory muscles.  Cardiac: RRR, no JVD Vascular: scattered varicosities present bilaterally.  Mild venous stasis changes to the legs bilaterally.  2+ soft pitting edema Vessel Right Left  Radial Palpable Palpable  PT Palpable Palpable  DP Palpable Palpable  Gastrointestinal: Non-distended. No guarding/no peritoneal signs.  Musculoskeletal: M/S 5/5 throughout.  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  Neurologic: CN 2-12 intact. Symmetrical.  Speech is fluent. Motor exam as listed above. Psychiatric: Judgment intact, Mood & affect appropriate for pt's clinical situation. Dermatologic: mild venous rashes no ulcers noted.  No changes consistent with cellulitis. Lymph : No lichenification or skin changes of chronic lymphedema.  CBC No results found for: WBC, HGB, HCT, MCV, PLT  BMET    Component Value Date/Time   NA 139 11/27/2016 1417   K 3.8 11/27/2016 1417   CL 106 11/27/2016 1417   CO2 25 11/27/2016 1417   GLUCOSE 133 (H) 11/27/2016 1417   BUN 12 11/27/2016 1417   CREATININE 0.78 11/27/2016 1417   CALCIUM 8.8 (L) 11/27/2016 1417   GFRNONAA >60 11/27/2016 1417   GFRAA >60 11/27/2016 1417   CrCl cannot be calculated (Patient's most recent lab result is older than the maximum 21 days allowed.).  COAG No results found for: INR, PROTIME  Radiology No results found.   Assessment/Plan 1. Lymphedema  No surgery or  intervention at this point in time.    I have reviewed my discussion with the patient regarding lymphedema and why it  causes symptoms.  Patient will continue wearing graduated compression stockings class 1 (20-30 mmHg) on a daily basis a prescription was given. The patient is reminded to put the stockings on first thing in the morning and removing them in the evening. The patient is instructed specifically not to sleep in the stockings.   In addition, behavioral modification throughout the day will be continued.  This will include frequent elevation (such as in a recliner), use of over the counter pain medications as needed and exercise such as walking.  I have reviewed systemic causes for chronic edema such as liver, kidney and cardiac etiologies and there does not appear to be any significant  changes in these organ systems over the past year.  The patient is under the impression that these organ systems are all stable and unchanged.    The patient will continue aggressive use of the  lymph pump.  This will continue to improve the edema control and prevent sequela such as ulcers and infections.   The patient will follow-up with me on an annual basis.  Next year she will need new sleeves   2. Chronic venous insufficiency  No surgery or intervention at this point in time.    I have reviewed my discussion with the patient regarding lymphedema and why it  causes symptoms.  Patient will continue wearing graduated compression stockings class 1 (20-30 mmHg) on a daily basis a prescription was given. The patient is reminded to put the stockings on first thing in the morning and removing them in the evening. The patient is instructed specifically not to sleep in the stockings.   In addition, behavioral modification throughout the day will be continued.  This will include frequent elevation (such as in a recliner), use of over the counter pain medications as needed and exercise such as walking.  I have  reviewed systemic causes for chronic edema such as liver, kidney and cardiac etiologies and there does not appear to be any significant changes in these organ systems over the past year.  The patient is under the impression that these organ systems are all stable and unchanged.    The patient will continue aggressive use of the  lymph pump.  This will continue to improve the edema control and prevent sequela such as ulcers and infections.   The patient will follow-up with me on an annual basis.    3. Primary osteoarthritis involving multiple joints Continue NSAID medications as already ordered, these medications have been reviewed and there are no changes at this time.  Continued activity and therapy was stressed.    Hortencia Pilar, MD  02/04/2018 3:02 PM

## 2018-03-27 DIAGNOSIS — Z780 Asymptomatic menopausal state: Secondary | ICD-10-CM | POA: Insufficient documentation

## 2018-03-27 DIAGNOSIS — E119 Type 2 diabetes mellitus without complications: Secondary | ICD-10-CM | POA: Insufficient documentation

## 2018-03-28 ENCOUNTER — Other Ambulatory Visit: Payer: Self-pay | Admitting: Internal Medicine

## 2018-03-28 DIAGNOSIS — Z1231 Encounter for screening mammogram for malignant neoplasm of breast: Secondary | ICD-10-CM

## 2018-05-06 ENCOUNTER — Ambulatory Visit: Payer: Self-pay | Admitting: Urology

## 2018-05-13 ENCOUNTER — Ambulatory Visit (INDEPENDENT_AMBULATORY_CARE_PROVIDER_SITE_OTHER): Payer: Medicare Other | Admitting: Urology

## 2018-05-13 ENCOUNTER — Encounter: Payer: Self-pay | Admitting: Urology

## 2018-05-13 VITALS — BP 106/66 | HR 86 | Ht 63.0 in | Wt 231.0 lb

## 2018-05-13 DIAGNOSIS — R32 Unspecified urinary incontinence: Secondary | ICD-10-CM | POA: Diagnosis not present

## 2018-05-13 DIAGNOSIS — N3946 Mixed incontinence: Secondary | ICD-10-CM

## 2018-05-13 NOTE — Addendum Note (Signed)
Addended by: Tommy Rainwater on: 05/13/2018 04:12 PM   Modules accepted: Orders

## 2018-05-13 NOTE — Progress Notes (Signed)
05/13/2018 1:55 PM   Brandy Oneill 07-29-1951 [id number]  Referring provider: Glendon Axe, MD [facility] [place], [place] [address]  Chief Complaint  Patient presents with  . Urinary Incontinence    New Patient    HPI: I was consulted to assess the patient is urinary incontinence worsening over number years.  She describes an InterStim device placed in [place] in 2013.  Based upon the history I think it had minimal benefit to none.  She currently sometimes leaks with coughing sneezing bending lifting.  She can leak when she steps.  Her primary symptom is urge incontinence.  She has small volume bedwetting.  She sometimes has foot on the floor syndrome.  She wears 3 or 4 pads a day quite wet  She voids every 1 hour and cannot hold it for 2 hours.  She gets up 3-4 times a night  She describes a potential bladder injury or stones treated with lithotripsy and probably ureteroscopy.  She does not think she actually had bladder surgery.  She does not get bladder infections.  She has no neurologic issues.  She has not had a hysterectomy.  Modifying factors: There are no other modifying factors  Associated signs and symptoms: There are no other associated signs and symptoms Aggravating and relieving factors: There are no other aggravating or relieving factors Severity: Moderate Duration: Persistent   PMH: Past Medical History:  Diagnosis Date  . Anxiety   . Arthritis   . Diverticulosis 2016  . External hemorrhoid   . Family history of adverse reaction to anesthesia    sister becomes disoriented  . 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 History of kidney stones   . Hyperlipidemia   . Hypertension   . PONV (postoperative nausea and vomiting) 2001   kidney stone  . Pre-diabetes   . Psoriasis   . Tubular adenoma of colon     Surgical History: Past Surgical History:  Procedure Laterality Date  . bladder stimulater  2014  .  CATARACT EXTRACTION W/ INTRAOCULAR LENS IMPLANT Bilateral 2014   one eye done then the other eye done a month later  . EYE SURGERY Right 2018   macular hole  . JOINT REPLACEMENT Right 10/27/2016  . JOINT REPLACEMENT Left 2014  . KNEE CLOSED REDUCTION Right 11/29/2016   Procedure: CLOSED MANIPULATION KNEE;  Surgeon: Leanor Kail, MD;  Location: [facility] ORS;  Service: Orthopedics;  Laterality: Right;    Home Medications:  Allergies as of 05/13/2018      Reactions   Kiwi Extract       Medication List        Accurate as of 05/13/18  1:55 PM. Always use your most recent med list.          fluticasone 50 MCG/ACT nasal spray Commonly known as:  FLONASE SHAKE LQ AND U 1 SPR IEN QD   lisinopril 40 MG tablet Commonly known as:  PRINIVIL,ZESTRIL Take 40 mg by mouth daily.   metoprolol succinate 25 MG 24 hr tablet Commonly known as:  TOPROL-XL Take 25 mg by mouth daily.   multivitamin with minerals Tabs tablet Take 1 tablet by mouth daily. Centrum for Women   oxybutynin 5 MG tablet Commonly known as:  DITROPAN Take 5 mg by mouth 2 (two) times daily.   pravastatin 10 MG tablet Commonly known as:  PRAVACHOL Take 10 mg by mouth every evening.   venlafaxine 37.5 MG tablet Commonly known as:  EFFEXOR Take 37.5 mg by mouth daily.       Allergies:  Allergies  Allergen Reactions  . Kiwi Extract     Family History: Family History  Problem Relation Age of Onset  . Breast cancer Neg Hx     Social History:  reports that she quit smoking about 5 years ago. She has never used smokeless tobacco. She reports that she does not use drugs. Her alcohol history is not on file.  ROS: UROLOGY Frequent Urination?: Yes Hard to postpone urination?: Yes Burning/pain with urination?: No Get up at night to urinate?: Yes Leakage of urine?: Yes Urine stream starts and stops?: Yes Trouble starting stream?: No Do you have to strain to urinate?: No Blood in urine?: No Urinary tract  infection?: No Sexually transmitted disease?: No Injury to kidneys or bladder?: Yes Painful intercourse?: No Weak stream?: No Currently pregnant?: No Vaginal bleeding?: No Last menstrual period?: n  Gastrointestinal Nausea?: No Vomiting?: No Indigestion/heartburn?: No Diarrhea?: No Constipation?: No  Constitutional Fever: No Night sweats?: No Weight loss?: No Fatigue?: No  Skin Skin rash/lesions?: No Itching?: No  Eyes Blurred vision?: No Double vision?: No  Ears/Nose/Throat Sore throat?: No Sinus problems?: No  Hematologic/Lymphatic Swollen glands?: No Easy bruising?: No  Cardiovascular Leg swelling?: Yes Chest pain?: No  Respiratory Cough?: No Shortness of breath?: No  Endocrine Excessive thirst?: No  Musculoskeletal Back pain?: No Joint pain?: No  Neurological Headaches?: No Dizziness?: No  Psychologic Depression?: No Anxiety?: Yes  Physical Exam: BP 106/66   Pulse 86   Ht 5\' 3"  (1.6 m)   Wt 104.8 kg   BMI 40.92 kg/m   Constitutional:  Alert and oriented, No acute distress. HEENT: [place] AT, moist mucus membranes.  Trachea midline, no masses. Cardiovascular: No clubbing, cyanosis, or edema. Respiratory: Normal respiratory effort, no increased work of breathing. GI: Abdomen is soft, nontender, nondistended, no abdominal masses GU: Sam was limited due to leg cramps.  I felt the patient had very good bladder support with no prolapse and no stress incontinence Skin: No rashes, bruises or suspicious lesions. Lymph: No cervical or inguinal adenopathy. Neurologic: Grossly intact, no focal deficits, moving all 4 extremities. Psychiatric: Normal mood and affect.  Laboratory Data: No results found for: WBC, HGB, HCT, MCV, PLT  Lab Results  Component Value Date   CREATININE 0.78 11/27/2016    No results found for: PSA  No results found for: TESTOSTERONE  No results found for: HGBA1C  Urinalysis No results found for: COLORURINE,  APPEARANCEUR, LABSPEC, PHURINE, GLUCOSEU, HGBUR, BILIRUBINUR, KETONESUR, PROTEINUR, UROBILINOGEN, NITRITE, LEUKOCYTESUR  Pertinent Imaging:   Assessment & Plan: The patient has mixed incontinence but the primary symptom is urge incontinence.  She may have small volume bedwetting.  She has hourly frequency and moderate nocturia.  The device with its IPG is just lateral on the right side to the gluteal cleft somewhat out of the ordinary.  Impedance was within normal limits on all 4 leads.  Device had to be turned up to 2.3-3.6 for vaginal sensation in all 4 leads.  She will be trouble suited as protocol with Sarah.  We will proceed accordingly.  Were hoping to improve his current function.  She cannot remember taking Myrbetriq but states she tried all the available products  1. Urinary incontinence, unspecified type Incontinence - Urinalysis, Complete   No follow-ups on file.  Reece Packer, MD  [facility] [address], [place], [place] [address] [phone]

## 2018-05-20 ENCOUNTER — Ambulatory Visit: Payer: Medicare Other

## 2018-05-20 NOTE — Progress Notes (Signed)
Interstim Impedance :  Patient present today for interstim check. Patient states she is not feeling stimulation and her urinary symptoms of urinary frequency and urge incontinence.   interstim was off when first checked Impendance run:  0+1 0+2 0+3 -all showed impedance >4000  Impedance repeated at 1.5 and all were resolved  Battery life shows 4-86mo  Prog. #4- 3.9 Prog.#3- 2.8 increased 3.2 and felt sensation Program #2- 1.0 increased to 2.8 and felt sensation Program #1- 2.0 increased to 2.5 and felt sensation *patient felt sensation in vaginal area on all programs when increased  Patient was left on program #1 at 2.5 to extend battery life. Patient feels sensation in vaginal area and was told to keep a voiding dairy for 1 week and return for revaluation.

## 2018-05-27 ENCOUNTER — Ambulatory Visit: Payer: Medicare Other

## 2018-05-27 DIAGNOSIS — N3946 Mixed incontinence: Secondary | ICD-10-CM

## 2018-05-27 NOTE — Progress Notes (Signed)
Patient is doing much better , she states she has more control and is able to make it to the bathroom now. Patient was given instruction on how to increase amp and if her symptoms worsen she is to increase until she again feels sensation in the vaginal area. If this does not help to resolve symptoms she is to call for another impedance check. Patient was told to follow up with Dr. Matilde Sprang in 3 months for symptom recheck and battery life check.Marland Kitchen

## 2018-06-06 ENCOUNTER — Ambulatory Visit
Admission: RE | Admit: 2018-06-06 | Discharge: 2018-06-06 | Disposition: A | Discharge status: Home / Self Care | Payer: Medicare Other | Source: Ambulatory Visit | Attending: Internal Medicine | Admitting: Internal Medicine

## 2018-06-06 DIAGNOSIS — Z1231 Encounter for screening mammogram for malignant neoplasm of breast: Secondary | ICD-10-CM | POA: Diagnosis present

## 2018-06-24 ENCOUNTER — Telehealth: Payer: Self-pay | Admitting: Urology

## 2018-06-24 NOTE — Telephone Encounter (Signed)
Patient called and lm on vm asking for you to call her back, she said she had questions but did not say what about.  [phone]   Thanks, Sharyn Lull

## 2018-06-25 ENCOUNTER — Encounter: Payer: Self-pay | Admitting: Student

## 2018-06-26 ENCOUNTER — Ambulatory Visit: Payer: Medicare Other | Admitting: Anesthesiology

## 2018-06-26 ENCOUNTER — Encounter: Payer: Self-pay | Admitting: *Deleted

## 2018-06-26 ENCOUNTER — Ambulatory Visit
Admission: RE | Admit: 2018-06-26 | Discharge: 2018-06-26 | Disposition: A | Discharge status: Home / Self Care | Payer: Medicare Other | Source: Ambulatory Visit | Attending: Internal Medicine | Admitting: Internal Medicine

## 2018-06-26 ENCOUNTER — Encounter
Admission: RE | Disposition: A | Discharge status: Home / Self Care | Payer: Self-pay | Source: Ambulatory Visit | Attending: Internal Medicine

## 2018-06-26 DIAGNOSIS — Z79899 Other long term (current) drug therapy: Secondary | ICD-10-CM | POA: Insufficient documentation

## 2018-06-26 DIAGNOSIS — M1990 Unspecified osteoarthritis, unspecified site: Secondary | ICD-10-CM | POA: Insufficient documentation

## 2018-06-26 DIAGNOSIS — Z09 Encounter for follow-up examination after completed treatment for conditions other than malignant neoplasm: Secondary | ICD-10-CM | POA: Insufficient documentation

## 2018-06-26 DIAGNOSIS — F329 Major depressive disorder, single episode, unspecified: Secondary | ICD-10-CM | POA: Diagnosis not present

## 2018-06-26 DIAGNOSIS — K5730 Diverticulosis of large intestine without perforation or abscess without bleeding: Secondary | ICD-10-CM | POA: Insufficient documentation

## 2018-06-26 DIAGNOSIS — Z86010 Personal history of colonic polyps: Secondary | ICD-10-CM | POA: Diagnosis present

## 2018-06-26 DIAGNOSIS — R7303 Prediabetes: Secondary | ICD-10-CM | POA: Diagnosis not present

## 2018-06-26 DIAGNOSIS — Z91018 Allergy to other foods: Secondary | ICD-10-CM | POA: Insufficient documentation

## 2018-06-26 DIAGNOSIS — I10 Essential (primary) hypertension: Secondary | ICD-10-CM | POA: Insufficient documentation

## 2018-06-26 DIAGNOSIS — Z87442 Personal history of urinary calculi: Secondary | ICD-10-CM | POA: Insufficient documentation

## 2018-06-26 DIAGNOSIS — D120 Benign neoplasm of cecum: Secondary | ICD-10-CM | POA: Insufficient documentation

## 2018-06-26 DIAGNOSIS — F419 Anxiety disorder, unspecified: Secondary | ICD-10-CM | POA: Insufficient documentation

## 2018-06-26 DIAGNOSIS — E785 Hyperlipidemia, unspecified: Secondary | ICD-10-CM | POA: Diagnosis not present

## 2018-06-26 DIAGNOSIS — K641 Second degree hemorrhoids: Secondary | ICD-10-CM | POA: Diagnosis not present

## 2018-06-26 DIAGNOSIS — Q820 Hereditary lymphedema: Secondary | ICD-10-CM | POA: Insufficient documentation

## 2018-06-26 HISTORY — PX: COLONOSCOPY WITH PROPOFOL: SHX5780

## 2018-06-26 HISTORY — DX: Major depressive disorder, single episode, unspecified: F32.9

## 2018-06-26 HISTORY — DX: Depression, unspecified: F32.A

## 2018-06-26 SURGERY — COLONOSCOPY WITH PROPOFOL
Anesthesia: General

## 2018-06-26 MED ORDER — PROPOFOL 10 MG/ML IV BOLUS
INTRAVENOUS | Status: DC | PRN
  Administered 2018-06-26: 70 mg via INTRAVENOUS

## 2018-06-26 MED ORDER — PROPOFOL 500 MG/50ML IV EMUL
INTRAVENOUS | Status: DC | PRN
  Administered 2018-06-26: 130 ug/kg/min via INTRAVENOUS

## 2018-06-26 MED ORDER — SODIUM CHLORIDE 0.9 % IV SOLN
INTRAVENOUS | Status: DC
  Administered 2018-06-26: 1000 mL via INTRAVENOUS

## 2018-06-26 MED ORDER — PHENYLEPHRINE HCL 10 MG/ML IJ SOLN
INTRAMUSCULAR | Status: DC | PRN
  Administered 2018-06-26: 200 ug via INTRAVENOUS

## 2018-06-26 NOTE — H&P (Signed)
  Outpatient short stay form Pre-procedure 06/26/2018 10:06 AM Estie Sproule K. Alice Reichert, M.D.  Primary Physician: Glendon Axe, M.D.  Reason for visit:  Personal hx of colon polyps.   History of present illness:  Patient presents for personal hx of colon polyps 09/2014 - Dr. Rayann Heman. Denies change in bowel habits, rectal bleeding or abdominal pain.     Current Facility-Administered Medications:  .  0.9 %  sodium chloride infusion, , Intravenous, Continuous, Myya Meenach, Benay Pike, MD  Medications Prior to Admission  Medication Sig Dispense Refill Last Dose  . albuterol (PROVENTIL HFA;VENTOLIN HFA) 108 (90 Base) MCG/ACT inhaler Inhale into the lungs every 6 (six) hours as needed for wheezing or shortness of breath.     Marland Kitchen amLODipine (NORVASC) 2.5 MG tablet Take 2.5 mg by mouth daily.     Marland Kitchen EPINEPHrine (EPIPEN IJ) Inject as directed.     . metoprolol succinate (TOPROL-XL) 25 MG 24 hr tablet Take 25 mg by mouth daily.   06/25/2018 at Unknown time  . fluticasone (FLONASE) 50 MCG/ACT nasal spray SHAKE LQ AND U 1 SPR IEN QD  3 Taking  . lisinopril (PRINIVIL,ZESTRIL) 40 MG tablet Take 40 mg by mouth daily.   Taking  . Multiple Vitamin (MULTIVITAMIN WITH MINERALS) TABS tablet Take 1 tablet by mouth daily. Centrum for Women   Taking  . oxybutynin (DITROPAN) 5 MG tablet Take 5 mg by mouth 2 (two) times daily.   Taking  . pravastatin (PRAVACHOL) 10 MG tablet Take 10 mg by mouth every evening.   Taking  . venlafaxine (EFFEXOR) 37.5 MG tablet Take 37.5 mg by mouth daily.   Taking     Allergies  Allergen Reactions  . Kiwi Extract      Past Medical History:  Diagnosis Date  . Anxiety   . Arthritis   . Depression   . Diverticulosis 2016  . External hemorrhoid   . Family history of adverse reaction to anesthesia    sister becomes disoriented  . 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 History of kidney stones   . Hyperlipidemia   . Hypertension   . PONV (postoperative nausea and vomiting)  2001   kidney stone  . Pre-diabetes   . Psoriasis   . Tubular adenoma of colon     Review of systems:  Otherwise negative.    Physical Exam  Gen: Alert, oriented. Appears stated age.  HEENT: [place]/AT. PERRLA. Lungs: CTA, no wheezes. CV: RR nl S1, S2. Abd: soft, benign, no masses. BS+ Ext: No edema. Pulses 2+    Planned procedures: Proceed with colonoscopy. The patient understands the nature of the planned procedure, indications, risks, alternatives and potential complications including but not limited to bleeding, infection, perforation, damage to internal organs and possible oversedation/side effects from anesthesia. The patient agrees and gives consent to proceed.  Please refer to procedure notes for findings, recommendations and patient disposition/instructions.     Marisah Laker K. Alice Reichert, M.D. Gastroenterology 06/26/2018  10:06 AM

## 2018-06-26 NOTE — Op Note (Signed)
[facility] Gastroenterology Patient Name: Brandy Oneill Procedure Date: 06/26/2018 10:50 AM MRN: [id number] Account #: [id number] Date of Birth: [birth year] Admit Type: Outpatient Age: 67 Room: [facility] ENDO ROOM 4 Gender: Female Note Status: Finalized Procedure:            Colonoscopy Indications:          High risk colon cancer surveillance: Personal history                        of colonic polyps Providers:            Benay Pike. Alice Reichert MD, MD Referring MD:         Glendon Axe (Referring MD) Medicines:            Propofol per Anesthesia Complications:        No immediate complications. Procedure:            Pre-Anesthesia Assessment:                       - The risks and benefits of the procedure and the                        sedation options and risks were discussed with the                        patient. All questions were answered and informed                        consent was obtained.                       - Patient identification and proposed procedure were                        verified prior to the procedure by the nurse. The                        procedure was verified in the procedure room.                       - ASA Grade Assessment: III - A patient with severe                        systemic disease.                       - After reviewing the risks and benefits, the patient                        was deemed in satisfactory condition to undergo the                        procedure.                       After obtaining informed consent, the colonoscope was                        passed under direct vision. Throughout the procedure,  the patient's blood pressure, pulse, and oxygen                        saturations were monitored continuously. The                        Colonoscope was introduced through the anus and                        advanced to the the cecum, identified by appendiceal   orifice and ileocecal valve. The colonoscopy was                        performed without difficulty. The patient tolerated the                        procedure well. The quality of the bowel preparation                        was good. The ileocecal valve, appendiceal orifice, and                        rectum were photographed. Findings:      The perianal and digital rectal examinations were normal. Pertinent       negatives include normal sphincter tone and no palpable rectal lesions.      Multiple small and large-mouthed diverticula were found in the entire       colon.      A 4 mm polyp was found in the cecum. The polyp was sessile. The polyp       was removed with a jumbo cold forceps. Resection and retrieval were       complete.      Non-bleeding internal hemorrhoids were found during retroflexion. The       hemorrhoids were Grade II (internal hemorrhoids that prolapse but reduce       spontaneously).      The exam was otherwise without abnormality. Impression:           - Diverticulosis in the entire examined colon.                       - One 4 mm polyp in the cecum, removed with a jumbo                        cold forceps. Resected and retrieved.                       - Non-bleeding internal hemorrhoids.                       - The examination was otherwise normal. Recommendation:       - Patient has a contact number available for                        emergencies. The signs and symptoms of potential                        delayed complications were discussed with the patient.                        Return  to normal activities tomorrow. Written discharge                        instructions were provided to the patient.                       - Resume previous diet.                       - Continue present medications.                       - Repeat colonoscopy is recommended for surveillance.                        The colonoscopy date will be determined after pathology                         results from today's exam become available for review.                       - Return to GI office PRN.                       - The findings and recommendations were discussed with                        the patient and their spouse. Procedure Code(s):    --- Professional ---                       [phone], Colonoscopy, flexible; with biopsy, single or                        multiple Diagnosis Code(s):    --- Professional ---                       K57.30, Diverticulosis of large intestine without                        perforation or abscess without bleeding                       D12.0, Benign neoplasm of cecum                       K64.1, Second degree hemorrhoids                       Z86.010, Personal history of colonic polyps CPT copyright 2018 [employer]. All rights reserved. The codes documented in this report are preliminary and upon coder review may  be revised to meet current compliance requirements. Efrain Sella MD, MD 06/26/2018 11:23:53 AM This report has been signed electronically. Number of Addenda: 0 Note Initiated On: 06/26/2018 10:50 AM Scope Withdrawal Time: 0 hours 6 minutes 7 seconds  Total Procedure Duration: 0 hours 12 minutes 17 seconds       [facility]

## 2018-06-26 NOTE — Transfer of Care (Signed)
Immediate Anesthesia Transfer of Care Note  Patient: Brandy Oneill  Procedure(s) Performed: COLONOSCOPY WITH PROPOFOL (N/A )  Patient Location: PACU  Anesthesia Type:General  Level of Consciousness: sedated  Airway & Oxygen Therapy: Patient Spontanous Breathing and Patient connected to nasal cannula oxygen  Post-op Assessment: Report given to RN and Post -op Vital signs reviewed and stable  Post vital signs: Reviewed and stable  Last Vitals:  Vitals Value Taken Time  BP    Temp    Pulse 74 06/26/2018 11:25 AM  Resp 18 06/26/2018 11:25 AM  SpO2 98 % 06/26/2018 11:25 AM  Vitals shown include unvalidated device data.  Last Pain:  Vitals:   06/26/18 0957  TempSrc: Tympanic  PainSc: 0-No pain         Complications: No apparent anesthesia complications

## 2018-06-26 NOTE — Anesthesia Post-op Follow-up Note (Signed)
Anesthesia QCDR form completed.        

## 2018-06-26 NOTE — Anesthesia Preprocedure Evaluation (Signed)
Anesthesia Evaluation  Patient identified by MRN, date of birth, ID band Patient awake    Reviewed: Allergy & Precautions, H&P , NPO status , Patient's Chart, lab work & pertinent test results, reviewed documented beta blocker date and time   History of Anesthesia Complications (+) PONV, Family history of anesthesia reaction and history of anesthetic complications  Airway Mallampati: II   Neck ROM: full    Dental  (+) Poor Dentition   Pulmonary neg pulmonary ROS, former smoker,    Pulmonary exam normal        Cardiovascular Exercise Tolerance: Poor hypertension, On Medications negative cardio ROS Normal cardiovascular exam Rhythm:regular Rate:Normal     Neuro/Psych PSYCHIATRIC DISORDERS Anxiety Depression negative neurological ROS  negative psych ROS   GI/Hepatic negative GI ROS, Neg liver ROS,   Endo/Other  negative endocrine ROS  Renal/GU negative Renal ROS  negative genitourinary   Musculoskeletal   Abdominal   Peds  Hematology negative hematology ROS (+)   Anesthesia Other Findings Past Medical History: No date: Anxiety No date: Arthritis No date: Depression 2016: Diverticulosis No date: External hemorrhoid No date: Family history of adverse reaction to anesthesia     Comment:  sister becomes disoriented 2017: Hereditary lymphedema of legs     Comment:  bilaterally No date: History of kidney stones No date: Hyperlipidemia No date: Hypertension 2001: PONV (postoperative nausea and vomiting)     Comment:  kidney stone No date: Pre-diabetes No date: Psoriasis No date: Tubular adenoma of colon Past Surgical History: 2014: bladder stimulater 2014: CATARACT EXTRACTION W/ INTRAOCULAR LENS IMPLANT; Bilateral     Comment:  one eye done then the other eye done a month later No date: CESAREAN SECTION 2018: EYE SURGERY; Right     Comment:  macular hole 10/27/2016: JOINT REPLACEMENT; Right 2014: JOINT  REPLACEMENT; Left 11/29/2016: KNEE CLOSED REDUCTION; Right     Comment:  Procedure: CLOSED MANIPULATION KNEE;  Surgeon: Leanor Kail, MD;  Location: [facility] ORS;  Service: Orthopedics;              Laterality: Right; BMI    Body Mass Index:  38.71 kg/m     Reproductive/Obstetrics negative OB ROS                             Anesthesia Physical Anesthesia Plan  ASA: III  Anesthesia Plan: General   Post-op Pain Management:    Induction:   PONV Risk Score and Plan:   Airway Management Planned:   Additional Equipment:   Intra-op Plan:   Post-operative Plan:   Informed Consent: I have reviewed the patients History and Physical, chart, labs and discussed the procedure including the risks, benefits and alternatives for the proposed anesthesia with the patient or authorized representative who has indicated his/her understanding and acceptance.   Dental Advisory Given  Plan Discussed with: CRNA  Anesthesia Plan Comments:         Anesthesia Quick Evaluation

## 2018-06-26 NOTE — Anesthesia Postprocedure Evaluation (Signed)
Anesthesia Post Note  Patient: Krimson Massmann  Procedure(s) Performed: COLONOSCOPY WITH PROPOFOL (N/A )  Patient location during evaluation: PACU Anesthesia Type: General Level of consciousness: awake and alert Pain management: pain level controlled Vital Signs Assessment: post-procedure vital signs reviewed and stable Respiratory status: spontaneous breathing, nonlabored ventilation, respiratory function stable and patient connected to nasal cannula oxygen Cardiovascular status: blood pressure returned to baseline and stable Postop Assessment: no apparent nausea or vomiting Anesthetic complications: no     Last Vitals:  Vitals:   06/26/18 0957 06/26/18 1125  Pulse: 75 75  Resp: 18 16  Temp: (!) 36.4 C (!) 36.1 C  SpO2: 97% 98%    Last Pain:  Vitals:   06/26/18 1202  TempSrc:   PainSc: 0-No pain                 Molli Barrows

## 2018-06-26 NOTE — Anesthesia Procedure Notes (Signed)
Date/Time: 06/26/2018 11:12 AM Performed by: Nelda Marseille, CRNA Pre-anesthesia Checklist: Patient identified, Emergency Drugs available, Suction available, Patient being monitored and Timeout performed Oxygen Delivery Method: Nasal cannula

## 2018-06-26 NOTE — Interval H&P Note (Signed)
History and Physical Interval Note:  06/26/2018 10:08 AM  Brandy Oneill  has presented today for surgery, with the diagnosis of SCREENING  The various methods of treatment have been discussed with the patient and family. After consideration of risks, benefits and other options for treatment, the patient has consented to  Procedure(s): COLONOSCOPY WITH PROPOFOL (N/A) as a surgical intervention .  The patient's history has been reviewed, patient examined, no change in status, stable for surgery.  I have reviewed the patient's chart and labs.  Questions were answered to the patient's satisfaction.     [place], [place]

## 2018-06-27 ENCOUNTER — Encounter: Payer: Self-pay | Admitting: Internal Medicine

## 2018-06-28 LAB — SURGICAL PATHOLOGY

## 2018-07-05 NOTE — Telephone Encounter (Signed)
Left pt mess to call 

## 2018-09-02 ENCOUNTER — Ambulatory Visit (INDEPENDENT_AMBULATORY_CARE_PROVIDER_SITE_OTHER): Payer: Medicare Other | Admitting: Urology

## 2018-09-02 ENCOUNTER — Encounter: Payer: Self-pay | Admitting: Urology

## 2018-09-02 VITALS — BP 139/80 | HR 85 | Ht 63.0 in | Wt 220.0 lb

## 2018-09-02 DIAGNOSIS — N3946 Mixed incontinence: Secondary | ICD-10-CM | POA: Diagnosis not present

## 2018-09-02 MED ORDER — OXYBUTYNIN CHLORIDE 5 MG PO TABS: 5.0000 mg | ORAL_TABLET | Freq: Two times a day (BID) | ORAL | 11 refills | Status: DC

## 2018-09-02 NOTE — Progress Notes (Signed)
Patient presents today 3 month follow up after last interstim program change. She states that her symptoms of leakage are doing well.  An impedence check was done and no impedence found at 1.5 stimulation. Battery life today states 6-55months

## 2018-09-02 NOTE — Progress Notes (Signed)
09/02/2018 1:58 PM   Brandy Oneill April 25, 1951 [id number]  Referring provider: Glendon Axe, MD [facility] [place], [place] [address]  Chief Complaint  Patient presents with  . Urinary Incontinence    3 month followup    HPI: I was consulted to assess the patient is urinary incontinence worsening over number years.  She describes an InterStim device placed in [place] in 2013.  Based upon the history I think it had minimal benefit to none.  She currently sometimes leaks with coughing sneezing bending lifting.  She can leak when she steps.  Her primary symptom is urge incontinence.  She has small volume bedwetting.  She sometimes has foot on the floor syndrome.  She wears 3 or 4 pads a day quite wet  She voids every 1 hour and cannot hold it for 2 hours.  She gets up 3-4 times a night  She describes a potential bladder injury or stones treated with lithotripsy and probably ureteroscopy.  She does not think she actually had bladder surgery.    The patient has mixed incontinence but the primary symptom is urge incontinence.  She may have small volume bedwetting.  She has hourly frequency and moderate nocturia.  The device with its IPG is just lateral on the right side to the gluteal cleft somewhat out of the ordinary.  Impedance was within normal limits on all 4 leads.  Device had to be turned up to 2.3-3.6 for vaginal sensation in all 4 leads.  She will be troublehooted as protocol with Judson Roch.  We will proceed accordingly.  Were hoping to improve his current function.  She cannot remember taking Myrbetriq but states she tried all the available products  Today Patient is doing great since we turned up the apple to last visit.  Clinically no infections.  Minimal frequency and incontinence.  On oxybutynin and prescription renewed.  Impedance check normal and battery life 6 months to 2-1/2 years    PMH: Past Medical History:  Diagnosis Date  . Anxiety   .  Arthritis   . Depression   . Diverticulosis 2016  . External hemorrhoid   . Family history of adverse reaction to anesthesia    sister becomes disoriented  . 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 History of kidney stones   . Hyperlipidemia   . Hypertension   . PONV (postoperative nausea and vomiting) 2001   kidney stone  . Pre-diabetes   . Psoriasis   . Tubular adenoma of colon     Surgical History: Past Surgical History:  Procedure Laterality Date  . bladder stimulater  2014  . CATARACT EXTRACTION W/ INTRAOCULAR LENS IMPLANT Bilateral 2014   one eye done then the other eye done a month later  . CESAREAN SECTION    . COLONOSCOPY WITH PROPOFOL N/A 06/26/2018   Procedure: COLONOSCOPY WITH PROPOFOL;  Surgeon: [place], Benay Pike, MD;  Location: [facility] ENDOSCOPY;  Service: Gastroenterology;  Laterality: N/A;  . EYE SURGERY Right 2018   macular hole  . JOINT REPLACEMENT Right 10/27/2016  . JOINT REPLACEMENT Left 2014  . KNEE CLOSED REDUCTION Right 11/29/2016   Procedure: CLOSED MANIPULATION KNEE;  Surgeon: Leanor Kail, MD;  Location: [facility] ORS;  Service: Orthopedics;  Laterality: Right;    Home Medications:  Allergies as of 09/02/2018      Reactions   Kiwi Extract       Medication List       Accurate as of September 02, 2018  1:58 PM. Always use your most recent med list.        albuterol 108 (90 Base) MCG/ACT inhaler Commonly known as:  PROVENTIL HFA;VENTOLIN HFA Inhale into the lungs every 6 (six) hours as needed for wheezing or shortness of breath.   amLODipine 2.5 MG tablet Commonly known as:  NORVASC Take 2.5 mg by mouth daily.   EPIPEN IJ Inject as directed.   fluticasone 50 MCG/ACT nasal spray Commonly known as:  FLONASE SHAKE LQ AND U 1 SPR IEN QD   lisinopril 40 MG tablet Commonly known as:  PRINIVIL,ZESTRIL Take 40 mg by mouth daily.   metoprolol succinate 25 MG 24 hr tablet Commonly known as:  TOPROL-XL Take 25 mg by mouth daily.     multivitamin with minerals Tabs tablet Take 1 tablet by mouth daily. Centrum for Women   oxybutynin 5 MG tablet Commonly known as:  DITROPAN Take 5 mg by mouth 2 (two) times daily.   pravastatin 10 MG tablet Commonly known as:  PRAVACHOL Take 10 mg by mouth every evening.   venlafaxine 37.5 MG tablet Commonly known as:  EFFEXOR Take 37.5 mg by mouth daily.       Allergies:  Allergies  Allergen Reactions  . Kiwi Extract     Family History: Family History  Problem Relation Age of Onset  . Breast cancer Neg Hx     Social History:  reports that she quit smoking about 5 years ago. She has never used smokeless tobacco. She reports that she does not use drugs. No history on file for alcohol.  ROS: UROLOGY Frequent Urination?: No Hard to postpone urination?: No Burning/pain with urination?: No Get up at night to urinate?: No Leakage of urine?: Yes Urine stream starts and stops?: No Trouble starting stream?: No Do you have to strain to urinate?: No Blood in urine?: No Urinary tract infection?: No Sexually transmitted disease?: No Injury to kidneys or bladder?: No Painful intercourse?: No Weak stream?: No Currently pregnant?: No Vaginal bleeding?: No Last menstrual period?: n  Gastrointestinal Nausea?: No Vomiting?: No Indigestion/heartburn?: No Diarrhea?: No Constipation?: No  Constitutional Fever: No Night sweats?: No Weight loss?: No Fatigue?: No  Skin Skin rash/lesions?: Yes Itching?: No  Eyes Blurred vision?: No Double vision?: No  Ears/Nose/Throat Sore throat?: No Sinus problems?: No  Hematologic/Lymphatic Swollen glands?: No Easy bruising?: No  Cardiovascular Leg swelling?: Yes Chest pain?: No  Respiratory Cough?: No Shortness of breath?: No  Endocrine Excessive thirst?: No  Musculoskeletal Back pain?: No Joint pain?: No  Neurological Headaches?: No Dizziness?: No  Psychologic Depression?: No Anxiety?: No  Physical  Exam: BP 139/80 (BP Location: Left Arm, Patient Position: Sitting)   Pulse 85   Ht 5\' 3"  (1.6 m)   Wt 99.8 kg   BMI 38.97 kg/m   Constitutional:  Alert and oriented, No acute distress.  Laboratory Data: No results found for: WBC, HGB, HCT, MCV, PLT  Lab Results  Component Value Date   CREATININE 0.78 11/27/2016    No results found for: PSA  No results found for: TESTOSTERONE  No results found for: HGBA1C  Urinalysis No results found for: COLORURINE, APPEARANCEUR, LABSPEC, PHURINE, GLUCOSEU, HGBUR, BILIRUBINUR, KETONESUR, PROTEINUR, UROBILINOGEN, NITRITE, LEUKOCYTESUR  Pertinent Imaging:   Assessment & Plan: Reassess in 1 year and renew prescription  There are no diagnoses linked to this encounter.  No follow-ups on file.  Reece Packer, MD  [facility] [address], [place], [place] [address] [phone]  

## 2018-09-05 DIAGNOSIS — M549 Dorsalgia, unspecified: Secondary | ICD-10-CM | POA: Insufficient documentation

## 2018-09-13 ENCOUNTER — Other Ambulatory Visit: Payer: Self-pay | Admitting: Unknown Physician Specialty

## 2018-09-13 DIAGNOSIS — M5440 Lumbago with sciatica, unspecified side: Secondary | ICD-10-CM

## 2018-09-20 ENCOUNTER — Ambulatory Visit: Payer: BLUE CROSS/BLUE SHIELD

## 2018-09-20 ENCOUNTER — Ambulatory Visit
Admission: RE | Admit: 2018-09-20 | Discharge: 2018-09-20 | Disposition: A | Discharge status: Home / Self Care | Payer: Medicare Other | Source: Ambulatory Visit | Attending: Unknown Physician Specialty | Admitting: Unknown Physician Specialty

## 2018-09-20 DIAGNOSIS — M5440 Lumbago with sciatica, unspecified side: Secondary | ICD-10-CM | POA: Diagnosis not present

## 2018-09-26 DIAGNOSIS — Z6838 Body mass index (BMI) 38.0-38.9, adult: Secondary | ICD-10-CM | POA: Insufficient documentation

## 2018-09-27 DIAGNOSIS — M5416 Radiculopathy, lumbar region: Secondary | ICD-10-CM | POA: Insufficient documentation

## 2018-09-27 DIAGNOSIS — M48061 Spinal stenosis, lumbar region without neurogenic claudication: Secondary | ICD-10-CM | POA: Insufficient documentation

## 2019-01-06 DIAGNOSIS — I5189 Other ill-defined heart diseases: Secondary | ICD-10-CM

## 2019-01-06 DIAGNOSIS — I358 Other nonrheumatic aortic valve disorders: Secondary | ICD-10-CM

## 2019-01-06 HISTORY — DX: Other ill-defined heart diseases: I51.89

## 2019-01-06 HISTORY — DX: Other nonrheumatic aortic valve disorders: I35.8

## 2019-02-06 ENCOUNTER — Encounter (INDEPENDENT_AMBULATORY_CARE_PROVIDER_SITE_OTHER): Payer: Self-pay | Admitting: Vascular Surgery

## 2019-02-06 ENCOUNTER — Ambulatory Visit (INDEPENDENT_AMBULATORY_CARE_PROVIDER_SITE_OTHER): Payer: Medicare Other | Admitting: Vascular Surgery

## 2019-02-06 ENCOUNTER — Other Ambulatory Visit: Payer: Self-pay

## 2019-02-06 VITALS — BP 138/80 | HR 79 | Resp 14 | Ht 64.0 in | Wt 225.0 lb

## 2019-02-06 DIAGNOSIS — Z87891 Personal history of nicotine dependence: Secondary | ICD-10-CM

## 2019-02-06 DIAGNOSIS — Z79899 Other long term (current) drug therapy: Secondary | ICD-10-CM

## 2019-02-06 DIAGNOSIS — M159 Polyosteoarthritis, unspecified: Secondary | ICD-10-CM

## 2019-02-06 DIAGNOSIS — I890 Lymphedema, not elsewhere classified: Secondary | ICD-10-CM

## 2019-02-06 DIAGNOSIS — I872 Venous insufficiency (chronic) (peripheral): Secondary | ICD-10-CM

## 2019-02-06 DIAGNOSIS — I10 Essential (primary) hypertension: Secondary | ICD-10-CM | POA: Diagnosis not present

## 2019-02-06 DIAGNOSIS — M150 Primary generalized (osteo)arthritis: Secondary | ICD-10-CM | POA: Diagnosis not present

## 2019-02-06 DIAGNOSIS — E785 Hyperlipidemia, unspecified: Secondary | ICD-10-CM | POA: Insufficient documentation

## 2019-02-06 DIAGNOSIS — Z791 Long term (current) use of non-steroidal anti-inflammatories (NSAID): Secondary | ICD-10-CM

## 2019-02-06 DIAGNOSIS — E782 Mixed hyperlipidemia: Secondary | ICD-10-CM

## 2019-02-06 DIAGNOSIS — M8949 Other hypertrophic osteoarthropathy, multiple sites: Secondary | ICD-10-CM

## 2019-02-06 NOTE — Progress Notes (Signed)
MRN : [id number]  Brandy Oneill is a 68 y.o. (03-08-1951) female who presents with chief complaint of  Chief Complaint  Patient presents with  . Follow-up  .  History of Present Illness:   The patient returns to the office for followup evaluation regarding leg swelling.  The swelling has persisted but with the lymph pump is much, much better controlled. The pain associated with swelling is essentially eliminated. There have not been any interval development of a ulcerations or wounds.  The patient denies problems with the pump, noting it is working well and the leggings are in good condition.  Since the previous visit the patient has been wearing graduated compression stockings and using the lymph pump on a routine basis and  has noted significant improvement in the lymphedema.   Patient stated the lymph pump has been a very positive factor in her care.    Current Meds  Medication Sig  . albuterol (PROVENTIL HFA;VENTOLIN HFA) 108 (90 Base) MCG/ACT inhaler Inhale into the lungs every 6 (six) hours as needed for wheezing or shortness of breath.  Marland Kitchen amLODipine (NORVASC) 2.5 MG tablet Take 2.5 mg by mouth daily.  Marland Kitchen EPINEPHrine (EPIPEN IJ) Inject as directed.  . fluticasone (FLONASE) 50 MCG/ACT nasal spray SHAKE LQ AND U 1 SPR IEN QD  . lisinopril (PRINIVIL,ZESTRIL) 40 MG tablet Take 40 mg by mouth daily.  . Multiple Vitamin (MULTIVITAMIN WITH MINERALS) TABS tablet Take 1 tablet by mouth daily. Centrum for Women  . oxybutynin (DITROPAN) 5 MG tablet Take 1 tablet (5 mg total) by mouth 2 (two) times daily.  . pravastatin (PRAVACHOL) 10 MG tablet Take 10 mg by mouth every evening.  . venlafaxine (EFFEXOR) 37.5 MG tablet Take 37.5 mg by mouth daily.    Past Medical History:  Diagnosis Date  . Anxiety   . Arthritis   . Depression   . Diverticulosis 2016  . External hemorrhoid   . Family history of adverse reaction to anesthesia    sister becomes disoriented  . 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 History of kidney stones   . Hyperlipidemia   . Hypertension   . PONV (postoperative nausea and vomiting) 2001   kidney stone  . Pre-diabetes   . Psoriasis   . Tubular adenoma of colon     Past Surgical History:  Procedure Laterality Date  . bladder stimulater  2014  . CATARACT EXTRACTION W/ INTRAOCULAR LENS IMPLANT Bilateral 2014   one eye done then the other eye done a month later  . CESAREAN SECTION    . COLONOSCOPY WITH PROPOFOL N/A 06/26/2018   Procedure: COLONOSCOPY WITH PROPOFOL;  Surgeon: Toledo, Benay Pike, MD;  Location: [facility];  Service: Gastroenterology;  Laterality: N/A;  . EYE SURGERY Right 2018   macular hole  . JOINT REPLACEMENT Right 10/27/2016  . JOINT REPLACEMENT Left 2014  . KNEE CLOSED REDUCTION Right 11/29/2016   Procedure: CLOSED MANIPULATION KNEE;  Surgeon: Leanor Kail, MD;  Location: [facility] ORS;  Service: Orthopedics;  Laterality: Right;    Social History Social History   Tobacco Use  . Smoking status: Former Smoker    Quit date: 11/27/2012    Years since quitting: 6.1  . Smokeless tobacco: Never Used  Substance Use Topics  . Alcohol use: Not on file    Comment: rare 1 drink per month  . Drug use: No    Family History Family History  Problem Relation Age of Onset  .  Breast cancer Neg Hx     Allergies  Allergen Reactions  . Kiwi Extract      REVIEW OF SYSTEMS (Negative unless checked)  Constitutional: [] Weight loss  [] Fever  [] Chills Cardiac: [] Chest pain   [] Chest pressure   [] Palpitations   [] Shortness of breath when laying flat   [] Shortness of breath with exertion. Vascular:  [] Pain in legs with walking   [] Pain in legs at rest  [] History of DVT   [] Phlebitis   [x] Swelling in legs   [] Varicose veins   [] Non-healing ulcers Pulmonary:   [] Uses home oxygen   [] Productive cough   [] Hemoptysis   [] Wheeze  [] COPD   [] Asthma Neurologic:  [] Dizziness   [] Seizures   [] History of stroke    [] History of TIA  [] Aphasia   [] Vissual changes   [] Weakness or numbness in arm   [] Weakness or numbness in leg Musculoskeletal:   [] Joint swelling   [x] Joint pain   [] Low back pain Hematologic:  [] Easy bruising  [] Easy bleeding   [] Hypercoagulable state   [] Anemic Gastrointestinal:  [] Diarrhea   [] Vomiting  [] Gastroesophageal reflux/heartburn   [] Difficulty swallowing. Genitourinary:  [] Chronic kidney disease   [] Difficult urination  [] Frequent urination   [] Blood in urine Skin:  [] Rashes   [] Ulcers  Psychological:  [] History of anxiety   []  History of major depression.  Physical Examination  Vitals:   02/06/19 1133  BP: 138/80  Pulse: 79  Resp: 14  Weight: 225 lb (102.1 kg)  Height: 5\' 4"  (1.626 m)   Body mass index is 38.62 kg/m. Gen: WD/WN, NAD Head: [place]/AT, No temporalis wasting.  Ear/Nose/Throat: Hearing grossly intact, nares w/o erythema or drainage Eyes: PER, EOMI, sclera nonicteric.  Neck: Supple, no large masses.   Pulmonary:  Good air movement, no audible wheezing bilaterally, no use of accessory muscles.  Cardiac: RRR, no JVD Vascular: scattered varicosities present bilaterally.  Mild venous stasis changes to the legs bilaterally.  3+ soft pitting edema Vessel Right Left  Radial Palpable Palpable  PT Palpable Palpable  DP Palpable Palpable  Gastrointestinal: Non-distended. No guarding/no peritoneal signs.  Musculoskeletal: M/S 5/5 throughout.  No deformity or atrophy.  Neurologic: CN 2-12 intact. Symmetrical.  Speech is fluent. Motor exam as listed above. Psychiatric: Judgment intact, Mood & affect appropriate for pt's clinical situation. Dermatologic: Mild venous rashes no ulcers noted.  No changes consistent with cellulitis. Lymph : No lichenification or skin changes of chronic lymphedema.  CBC No results found for: WBC, HGB, HCT, MCV, PLT  BMET    Component Value Date/Time   NA 139 11/27/2016 1417   K 3.8 11/27/2016 1417   CL 106 11/27/2016 1417   CO2 25  11/27/2016 1417   GLUCOSE 133 (H) 11/27/2016 1417   BUN 12 11/27/2016 1417   CREATININE 0.78 11/27/2016 1417   CALCIUM 8.8 (L) 11/27/2016 1417   GFRNONAA >60 11/27/2016 1417   GFRAA >60 11/27/2016 1417   CrCl cannot be calculated (Patient's most recent lab result is older than the maximum 21 days allowed.).  COAG No results found for: INR, PROTIME  Radiology No results found.    Assessment/Plan 1. Lymphedema  No surgery or intervention at this point in time.    I have reviewed my discussion with the patient regarding lymphedema and why it  causes symptoms.  Patient will continue wearing graduated compression stockings class 1 (20-30 mmHg) on a daily basis a prescription was given. The patient is reminded to put the stockings on first thing in the morning  and removing them in the evening. The patient is instructed specifically not to sleep in the stockings.   In addition, behavioral modification throughout the day will be continued.  This will include frequent elevation (such as in a recliner), use of over the counter pain medications as needed and exercise such as walking.  I have reviewed systemic causes for chronic edema such as liver, kidney and cardiac etiologies and there does not appear to be any significant changes in these organ systems over the past year.  The patient is under the impression that these organ systems are all stable and unchanged.    The patient will continue aggressive use of the  lymph pump.  This will continue to improve the edema control and prevent sequela such as ulcers and infections.   The patient will follow-up with me on an annual basis.    2. Chronic venous insufficiency  No surgery or intervention at this point in time.    I have reviewed my discussion with the patient regarding lymphedema and why it  causes symptoms.  Patient will continue wearing graduated compression stockings class 1 (20-30 mmHg) on a daily basis a prescription was given.  The patient is reminded to put the stockings on first thing in the morning and removing them in the evening. The patient is instructed specifically not to sleep in the stockings.   In addition, behavioral modification throughout the day will be continued.  This will include frequent elevation (such as in a recliner), use of over the counter pain medications as needed and exercise such as walking.  I have reviewed systemic causes for chronic edema such as liver, kidney and cardiac etiologies and there does not appear to be any significant changes in these organ systems over the past year.  The patient is under the impression that these organ systems are all stable and unchanged.    The patient will continue aggressive use of the  lymph pump.  This will continue to improve the edema control and prevent sequela such as ulcers and infections.   The patient will follow-up with me on an annual basis.    3. Primary osteoarthritis involving multiple joints Continue NSAID medications as already ordered, these medications have been reviewed and there are no changes at this time.  Continued activity and therapy was stressed.   4. Essential hypertension Continue antihypertensive medications as already ordered, these medications have been reviewed and there are no changes at this time.   5. Mixed hyperlipidemia Continue statin as ordered and reviewed, no changes at this time     Hortencia Pilar, MD  02/06/2019 9:17 PM

## 2019-04-14 ENCOUNTER — Encounter: Payer: Self-pay | Admitting: Urology

## 2019-04-14 ENCOUNTER — Ambulatory Visit (INDEPENDENT_AMBULATORY_CARE_PROVIDER_SITE_OTHER): Payer: Medicare Other | Admitting: Urology

## 2019-04-14 ENCOUNTER — Other Ambulatory Visit: Payer: Self-pay

## 2019-04-14 VITALS — BP 124/80 | HR 94 | Ht 64.0 in | Wt 226.0 lb

## 2019-04-14 DIAGNOSIS — N3946 Mixed incontinence: Secondary | ICD-10-CM | POA: Diagnosis not present

## 2019-04-14 MED ORDER — OXYBUTYNIN CHLORIDE 5 MG PO TABS: 5.0000 mg | ORAL_TABLET | Freq: Two times a day (BID) | ORAL | 11 refills | Status: DC

## 2019-04-14 NOTE — Progress Notes (Signed)
04/14/2019 1:44 PM   Brandy Oneill 1951-07-11 [id number]  Referring provider: Glendon Axe, MD [facility] [place],  [place] [id number]  Chief Complaint  Patient presents with  . Follow-up    HPI: I was consulted to assess the patient is urinary incontinence worsening over number years. She describes an InterStim device placed in [place] in 2013. Based upon the history I think it had minimal benefit to none. She currently sometimes leaks with coughing sneezing bending lifting. She can leak when she steps. Her primary symptom is urge incontinence. She has small volume bedwetting. She sometimes has foot on the floor syndrome. She wears 3 or 4 pads a day quite wet  She voids every 1 hour and cannot hold it for 2 hours. She gets up 3-4 times a night  She describes a potential bladder injury or stones treated with lithotripsy and probably ureteroscopy. She does not think she actually had bladder surgery.    The patient has mixed incontinence but the primary symptom is urge incontinence. She may have small volume bedwetting. She has hourly frequency and moderate nocturia. The device with its IPG is just lateral on the right side to the gluteal cleft somewhat out of the ordinary. Impedance was within normal limits on all 4 leads. Device had to be turned up to 2.3-3.6 for vaginal sensation in all 4 leads. She will be troublehooted as protocol with Judson Roch. We will proceed accordingly. Were hoping to improve his current function. She cannot remember taking Myrbetriq but states she tried all the available products  Patient is doing great since we turned up the apple to last visit.  Clinically no infections.  Minimal frequency and incontinence.  On oxybutynin and prescription renewed.  Impedance check normal and battery life 6 months to 2-1/2 years  Today Patient is now taking 2 oxybutynin in the morning and 2 at night.  She is on a bad day she can  soak 2 or 3 pads.  It depends on fluid intake.  She is not sure what the stimulator actually works.  She will try different programs.  She will feel it and then it stops.  It is difficult to know if is just her loss of sensation of it over time versus the IPG turning off.  She says she is tried most medications.  She agrees we may be reaching the end of her algorithm.  I did not approach Botox today.  Modifying factors: There are no other modifying factors  Associated signs and symptoms: There are no other associated signs and symptoms Aggravating and relieving factors: There are no other aggravating or relieving factors Severity: Moderate Duration: Persistent   PMH: Past Medical History:  Diagnosis Date  . Anxiety   . Arthritis   . Depression   . Diverticulosis 2016  . External hemorrhoid   . Family history of adverse reaction to anesthesia    sister becomes disoriented  . 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 History of kidney stones   . Hyperlipidemia   . Hypertension   . PONV (postoperative nausea and vomiting) 2001   kidney stone  . Pre-diabetes   . Psoriasis   . Tubular adenoma of colon     Surgical History: Past Surgical History:  Procedure Laterality Date  . bladder stimulater  2014  . CATARACT EXTRACTION W/ INTRAOCULAR LENS IMPLANT Bilateral 2014   one eye done then the other eye done a month later  . CESAREAN  SECTION    . COLONOSCOPY WITH PROPOFOL N/A 06/26/2018   Procedure: COLONOSCOPY WITH PROPOFOL;  Surgeon: Toledo, Benay Pike, MD;  Location: [facility];  Service: Gastroenterology;  Laterality: N/A;  . EYE SURGERY Right 2018   macular hole  . JOINT REPLACEMENT Right 10/27/2016  . JOINT REPLACEMENT Left 2014  . KNEE CLOSED REDUCTION Right 11/29/2016   Procedure: CLOSED MANIPULATION KNEE;  Surgeon: Leanor Kail, MD;  Location: [facility] OR[facility];  Service: Orthopedics;  Laterality: Right;    Home Medications:  Allergies as of 04/14/2019       Reactions   Kiwi Extract       Medication List       Accurate as of April 14, 2019  1:44 PM. If you have any questions, ask your nurse or doctor.        albuterol 108 (90 Base) MCG/ACT inhaler Commonly known as: VENTOLIN HFA Inhale into the lungs every 6 (six) hours as needed for wheezing or shortness of breath.   amLODipine 2.5 MG tablet Commonly known as: NORVASC Take 2.5 mg by mouth daily.   EPIPEN IJ Inject as directed.   fluticasone 50 MCG/ACT nasal spray Commonly known as: FLONASE SHAKE LQ AND U 1 SPR IEN QD   lisinopril 40 MG tablet Commonly known as: ZESTRIL Take 40 mg by mouth daily.   metoprolol succinate 25 MG 24 hr tablet Commonly known as: TOPROL-XL Take 25 mg by mouth daily.   multivitamin with minerals Tabs tablet Take 1 tablet by mouth daily. Centrum for Women   oxybutynin 5 MG tablet Commonly known as: DITROPAN Take 1 tablet (5 mg total) by mouth 2 (two) times daily.   pravastatin 10 MG tablet Commonly known as: PRAVACHOL Take 10 mg by mouth every evening.   venlafaxine 37.5 MG tablet Commonly known as: EFFEXOR Take 37.5 mg by mouth daily.       Allergies:  Allergies  Allergen Reactions  . Kiwi Extract     Family History: Family History  Problem Relation Age of Onset  . Breast cancer Neg Hx     Social History:  reports that she quit smoking about 6 years ago. She has never used smokeless tobacco. She reports that she does not use drugs. No history on file for alcohol.  ROS: UROLOGY Frequent Urination?: Yes Hard to postpone urination?: Yes Burning/pain with urination?: No Get up at night to urinate?: Yes Leakage of urine?: No Urine stream starts and stops?: No Trouble starting stream?: No Do you have to strain to urinate?: No Blood in urine?: No Urinary tract infection?: No Sexually transmitted disease?: No Injury to kidneys or bladder?: No Painful intercourse?: No Weak stream?: No Currently pregnant?: No Vaginal  bleeding?: No Last menstrual period?: n  Gastrointestinal Nausea?: No Vomiting?: No Indigestion/heartburn?: No Diarrhea?: No Constipation?: No  Constitutional Fever: No Night sweats?: No Weight loss?: No Fatigue?: No  Skin Skin rash/lesions?: No Itching?: No  Eyes Blurred vision?: No Double vision?: No  Ears/Nose/Throat Sore throat?: No Sinus problems?: No  Hematologic/Lymphatic Swollen glands?: No Easy bruising?: No  Cardiovascular Leg swelling?: No Chest pain?: No  Respiratory Cough?: No Shortness of breath?: No  Endocrine Excessive thirst?: No  Musculoskeletal Back pain?: No Joint pain?: No  Neurological Headaches?: No Dizziness?: No  Psychologic Depression?: No Anxiety?: No  Physical Exam: BP 124/80   Pulse 94   Ht 5\' 4"  (1.626 m)   Wt 226 lb (102.5 kg)   BMI 38.79 kg/m   Constitutional:  Alert and oriented,  No acute distress.   Laboratory Data: No results found for: WBC, HGB, HCT, MCV, PLT  Lab Results  Component Value Date   CREATININE 0.78 11/27/2016    No results found for: PSA  No results found for: TESTOSTERONE  No results found for: HGBA1C  Urinalysis No results found for: COLORURINE, APPEARANCEUR, LABSPEC, PHURINE, GLUCOSEU, HGBUR, BILIRUBINUR, KETONESUR, PROTEINUR, UROBILINOGEN, NITRITE, LEUKOCYTESUR  Pertinent Imaging:   Assessment & Plan: Judson Roch is  going to try to get a baseline and troubleshoot the device and keep me posted.  We will renew the oxybutynin.  Patient is never had Botox.  Judson Roch told me that the impedance was within normal limits.  She could feel vaginal sensation at low settings in all 4-lead positions.  Battery life is 18 months or less.  This does point to the fact of the device itself may have never worked that well and replacing it may not be ideal but it could be considered pending patient requests  There are no diagnoses linked to this encounter.  No follow-ups on file.  Reece Packer,  MD  [facility] [address], [place], [place] [address] [phone]

## 2019-05-05 ENCOUNTER — Ambulatory Visit: Payer: Medicare Other

## 2019-05-15 ENCOUNTER — Ambulatory Visit (INDEPENDENT_AMBULATORY_CARE_PROVIDER_SITE_OTHER): Payer: Medicare Other

## 2019-05-15 ENCOUNTER — Other Ambulatory Visit: Payer: Self-pay

## 2019-05-15 DIAGNOSIS — N3946 Mixed incontinence: Secondary | ICD-10-CM | POA: Diagnosis not present

## 2019-05-15 NOTE — Progress Notes (Signed)
Patient presents today for follow up with interstim device. She states that she has been doing well and actually moved out of [place] which is why she was delayed in follow up. Patient states that her urinary symptoms are so much better and she is very happy at this time.  She is currently on pg #2 amp-3.3 She forgot her voiding dairy but states on average that she only has 0-1 getting up at night and has only had 1 total accident since her last visit and that was due to being stuck in traffic.   She states that she switches programs back and forth about every week when she stops feeling stimulation and this helps keep her symptoms from worsening.  Patient was told to follow up in 3-23months with Dr. Matilde Sprang for a recheck on symptoms and to keep up to date on battery life

## 2019-05-15 NOTE — Progress Notes (Signed)
Impeadance preformed today shows 0&1, 0&2, 0&3, 1&2, 1&3 >4000 1.0amp Amp was increase to 1.5 and impedance was run again and impedances were resolved at the increase amp Battery life 14-74mo Patient on program 2 today amp 1.0 amp was increased to 3.3 and patient felt stimulation in vagina P1 2.8 increased to 2.9 stimulation felt in vagina P3 2.9 increased to 3.1 stimulation felt in vagina P4 3.9 decreased to 3.1 stimulation felt in vagina  Patient was told to keep setting on program 2 and record in voiding dairy for 1 week if no improvement she may change to program 1 and record for 1 week   Patient is to follow up in 2wks due to being out of town for symptom recheck

## 2019-07-28 ENCOUNTER — Encounter: Payer: Self-pay | Admitting: Urology

## 2019-07-28 ENCOUNTER — Ambulatory Visit: Payer: Medicare Other | Admitting: Urology

## 2019-08-25 ENCOUNTER — Other Ambulatory Visit: Payer: Self-pay | Admitting: Internal Medicine

## 2019-08-25 DIAGNOSIS — Z1231 Encounter for screening mammogram for malignant neoplasm of breast: Secondary | ICD-10-CM

## 2019-09-01 ENCOUNTER — Ambulatory Visit: Payer: Medicare Other | Admitting: Urology

## 2019-09-05 ENCOUNTER — Ambulatory Visit
Admission: RE | Admit: 2019-09-05 | Discharge: 2019-09-05 | Disposition: A | Discharge status: Home / Self Care | Payer: Medicare Other | Source: Ambulatory Visit | Attending: Internal Medicine | Admitting: Internal Medicine

## 2019-09-05 DIAGNOSIS — Z1231 Encounter for screening mammogram for malignant neoplasm of breast: Secondary | ICD-10-CM | POA: Diagnosis not present

## 2019-09-15 ENCOUNTER — Ambulatory Visit: Payer: Medicare Other | Attending: Internal Medicine

## 2019-09-15 DIAGNOSIS — Z20822 Contact with and (suspected) exposure to covid-19: Secondary | ICD-10-CM

## 2019-09-16 LAB — NOVEL CORONAVIRUS, NAA: SARS-CoV-2, NAA: NOT DETECTED

## 2019-10-13 ENCOUNTER — Ambulatory Visit: Payer: Medicare Other | Attending: Internal Medicine

## 2019-10-13 DIAGNOSIS — Z23 Encounter for immunization: Secondary | ICD-10-CM | POA: Insufficient documentation

## 2019-10-13 NOTE — Progress Notes (Signed)
   Covid-19 Vaccination Clinic  Name:  Nyssa Piscitelli    MRN: [id number] DOB: 1951-04-07  10/13/2019  Ms. Colestock was observed post Covid-19 immunization for 15 minutes without incidence. She was provided with Vaccine Information Sheet and instruction to access the V-Safe system.   Ms. Snyder was instructed to call 911 with any severe reactions post vaccine: Marland Kitchen Difficulty breathing  . Swelling of your face and throat  . A fast heartbeat  . A bad rash all over your body  . Dizziness and weakness    Immunizations Administered    Name Date Dose VIS Date Route   Pfizer COVID-19 Vaccine 10/13/2019 10:44 AM 0.3 mL 08/01/2019 Intramuscular   Manufacturer: [place]   Lot: [id number]   [place]: [id number]

## 2019-11-04 ENCOUNTER — Ambulatory Visit: Payer: Medicare Other | Attending: Internal Medicine

## 2019-11-04 DIAGNOSIS — Z23 Encounter for immunization: Secondary | ICD-10-CM

## 2019-11-04 NOTE — Progress Notes (Signed)
   Covid-19 Vaccination Clinic  Name:  Brandy Oneill    MRN: [id number] DOB: Jan 26, 1951  11/04/2019  [name] was observed post Covid-19 immunization for 15 minutes without incident. She was provided with Vaccine Information Sheet and instruction to access the V-Safe system.   [name] was instructed to call 911 with any severe reactions post vaccine: Marland Kitchen Difficulty breathing  . Swelling of face and throat  . A fast heartbeat  . A bad rash all over body  . Dizziness and weakness   Immunizations Administered    Name Date Dose VIS Date Route   Pfizer COVID-19 Vaccine 11/04/2019 11:06 AM 0.3 mL 08/01/2019 Intramuscular   Manufacturer: [place]   Lot: [id number]   [place]: [id number]

## 2020-01-05 ENCOUNTER — Other Ambulatory Visit: Payer: Self-pay

## 2020-01-05 ENCOUNTER — Ambulatory Visit (INDEPENDENT_AMBULATORY_CARE_PROVIDER_SITE_OTHER): Payer: Medicare Other | Admitting: Nurse Practitioner

## 2020-01-05 ENCOUNTER — Encounter (INDEPENDENT_AMBULATORY_CARE_PROVIDER_SITE_OTHER): Payer: Self-pay | Admitting: Nurse Practitioner

## 2020-01-05 VITALS — BP 137/78 | HR 81 | Ht 63.0 in | Wt 233.0 lb

## 2020-01-05 DIAGNOSIS — I890 Lymphedema, not elsewhere classified: Secondary | ICD-10-CM | POA: Diagnosis not present

## 2020-01-05 DIAGNOSIS — E782 Mixed hyperlipidemia: Secondary | ICD-10-CM | POA: Diagnosis not present

## 2020-01-05 DIAGNOSIS — I10 Essential (primary) hypertension: Secondary | ICD-10-CM | POA: Diagnosis not present

## 2020-01-05 NOTE — Progress Notes (Signed)
Subjective:    Patient ID: Brandy Oneill, female    DOB: 1950/12/13, 69 y.o.   MRN: [id number] Chief Complaint  Patient presents with  . Follow-up    1 year F/U no studies     The patient returns to the office for followup evaluation regarding leg swelling.  The swelling has persisted but with the lymph pump is much, much better controlled. The pain associated with swelling is essentially eliminated. There have not been any interval development of a ulcerations or wounds.  The patient denies problems with the pump, noting it is working well however the leggings are not in good condition.  They have become frayed and worn down and are in need of replacement.  Since the previous visit the patient has been wearing graduated compression stockings and using the lymph pump on a routine basis and  has noted significant improvement in the lymphedema.   Patient stated the lymph pump has been a very positive factor in her care.    Review of Systems  Respiratory: Positive for wheezing.   Cardiovascular: Positive for leg swelling.  All other systems reviewed and are negative.      Objective:   Physical Exam Vitals reviewed.  HENT:     Head: Normocephalic.  Cardiovascular:     Rate and Rhythm: Normal rate and regular rhythm.     Heart sounds: Normal heart sounds.  Pulmonary:     Effort: Pulmonary effort is normal.     Breath sounds: Wheezing present.  Musculoskeletal:        General: Normal range of motion.     Right lower leg: Edema present.     Left lower leg: Edema present.  Skin:    General: Skin is warm.  Neurological:     Mental Status: She is alert and oriented to person, place, and time.  Psychiatric:        Mood and Affect: Mood normal.        Behavior: Behavior normal.        Thought Content: Thought content normal.        Judgment: Judgment normal.     BP 137/78   Pulse 81   Ht 5\' 3"  (1.6 m)   Wt 233 lb (105.7 kg)   BMI 41.27 kg/m   Past Medical History:    Diagnosis Date  . Anxiety   . Arthritis   . Depression   . Diverticulosis 2016  . External hemorrhoid   . Family history of adverse reaction to anesthesia    sister becomes disoriented  . 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 History of kidney stones   . Hyperlipidemia   . Hypertension   . PONV (postoperative nausea and vomiting) 2001   kidney stone  . Pre-diabetes   . Psoriasis   . Tubular adenoma of colon     Social History   Socioeconomic History  . Marital status: Married    Spouse name: Not on file  . Number of children: Not on file  . Years of education: Not on file  . Highest education level: Not on file  Occupational History  . Not on file  Tobacco Use  . Smoking status: Former Smoker    Quit date: 11/27/2012    Years since quitting: 7.1  . Smokeless tobacco: Never Used  Substance and Sexual Activity  . Alcohol use: Not on file    Comment: rare 1 drink per month  . Drug use: No  .  Sexual activity: Not on file  Other Topics Concern  . Not on file  Social History Narrative  . Not on file   Social Determinants of Health   Financial Resource Strain:   . Difficulty of Paying Living Expenses:   Food Insecurity:   . Worried About Charity fundraiser in the Last Year:   . Arboriculturist in the Last Year:   Transportation Needs:   . Film/video editor (Medical):   Marland Kitchen Lack of Transportation (Non-Medical):   Physical Activity:   . Days of Exercise per Week:   . Minutes of Exercise per Session:   Stress:   . Feeling of Stress :   Social Connections:   . Frequency of Communication with Friends and Family:   . Frequency of Social Gatherings with Friends and Family:   . Attends Religious Services:   . Active Member of Clubs or Organizations:   . Attends Archivist Meetings:   Marland Kitchen Marital Status:   Intimate Partner Violence:   . Fear of Current or Ex-Partner:   . Emotionally Abused:   Marland Kitchen Physically Abused:   . Sexually Abused:      Past Surgical History:  Procedure Laterality Date  . bladder stimulater  2014  . CATARACT EXTRACTION W/ INTRAOCULAR LENS IMPLANT Bilateral 2014   one eye done then the other eye done a month later  . CESAREAN SECTION    . COLONOSCOPY WITH PROPOFOL N/A 06/26/2018   Procedure: COLONOSCOPY WITH PROPOFOL;  Surgeon: Toledo, Benay Pike, MD;  Location: [facility] ENDOSCOPY;  Service: Gastroenterology;  Laterality: N/A;  . EYE SURGERY Right 2018   macular hole  . JOINT REPLACEMENT Right 10/27/2016  . JOINT REPLACEMENT Left 2014  . KNEE CLOSED REDUCTION Right 11/29/2016   Procedure: CLOSED MANIPULATION KNEE;  Surgeon: Leanor Kail, MD;  Location: [facility] ORS;  Service: Orthopedics;  Laterality: Right;    Family History  Problem Relation Age of Onset  . Breast cancer Neg Hx     Allergies  Allergen Reactions  . Kiwi Extract        Assessment & Plan:   1. Lymphedema  No surgery or intervention at this point in time.    I have reviewed my discussion with the patient regarding lymphedema and why it  causes symptoms.  Patient will continue wearing graduated compression stockings class 1 (20-30 mmHg) on a daily basis a prescription was given. The patient is reminded to put the stockings on first thing in the morning and removing them in the evening. The patient is instructed specifically not to sleep in the stockings.   In addition, behavioral modification throughout the day will be continued.  This will include frequent elevation (such as in a recliner), use of over the counter pain medications as needed and exercise such as walking.  I have reviewed systemic causes for chronic edema such as liver, kidney and cardiac etiologies and there does not appear to be any significant changes in these organ systems over the past year.  The patient is under the impression that these organ systems are all stable and unchanged.    The patient will continue aggressive use of the  lymph pump.  This will  continue to improve the edema control and prevent sequela such as ulcers and infections.  Pump she has sleeves were placed to ensure continued adequate working condition so that there are no changes or alterations in care.  The patient will follow-up with me on an annual  basis.    2. Essential hypertension Good blood pressure today.  On appropriate medications.  No changes needed today.  3. Mixed hyperlipidemia On appropriate statin.  No medication changes needed.   Current Outpatient Medications on File Prior to Visit  Medication Sig Dispense Refill  . albuterol (PROVENTIL HFA;VENTOLIN HFA) 108 (90 Base) MCG/ACT inhaler Inhale into the lungs every 6 (six) hours as needed for wheezing or shortness of breath.    Marland Kitchen amLODipine (NORVASC) 2.5 MG tablet Take 2.5 mg by mouth daily.    Marland Kitchen EPINEPHrine (EPIPEN IJ) Inject as directed.    . fluticasone (FLONASE) 50 MCG/ACT nasal spray SHAKE LQ AND U 1 SPR IEN QD  3  . lisinopril (PRINIVIL,ZESTRIL) 40 MG tablet Take 40 mg by mouth daily.    Marland Kitchen oxybutynin (DITROPAN) 5 MG tablet Take 1 tablet (5 mg total) by mouth 2 (two) times daily. 60 tablet 11  . pravastatin (PRAVACHOL) 10 MG tablet Take 10 mg by mouth every evening.    . venlafaxine (EFFEXOR) 37.5 MG tablet Take 37.5 mg by mouth daily.    . metoprolol succinate (TOPROL-XL) 25 MG 24 hr tablet Take 25 mg by mouth daily.    . Multiple Vitamin (MULTIVITAMIN WITH MINERALS) TABS tablet Take 1 tablet by mouth daily. Centrum for Women     No current facility-administered medications on file prior to visit.    There are no Patient Instructions on file for this visit. No follow-ups on file.   Kris Hartmann, NP

## 2020-02-09 ENCOUNTER — Ambulatory Visit (INDEPENDENT_AMBULATORY_CARE_PROVIDER_SITE_OTHER): Payer: Medicare Other | Admitting: Vascular Surgery

## 2020-03-12 DIAGNOSIS — I350 Nonrheumatic aortic (valve) stenosis: Secondary | ICD-10-CM | POA: Insufficient documentation

## 2020-04-14 DIAGNOSIS — I2510 Atherosclerotic heart disease of native coronary artery without angina pectoris: Secondary | ICD-10-CM

## 2020-04-14 HISTORY — DX: Atherosclerotic heart disease of native coronary artery without angina pectoris: I25.10

## 2020-04-14 HISTORY — PX: RIGHT/LEFT HEART CATH AND CORONARY ANGIOGRAPHY: CATH118266

## 2020-04-16 ENCOUNTER — Other Ambulatory Visit: Payer: Self-pay

## 2020-04-16 NOTE — Telephone Encounter (Signed)
Patient needs apt to refill medication. lmom.

## 2020-04-28 DIAGNOSIS — Z95820 Peripheral vascular angioplasty status with implants and grafts: Secondary | ICD-10-CM | POA: Insufficient documentation

## 2020-04-28 DIAGNOSIS — I2510 Atherosclerotic heart disease of native coronary artery without angina pectoris: Secondary | ICD-10-CM | POA: Insufficient documentation

## 2020-04-28 HISTORY — PX: PERCUTANEOUS CORONARY STENT INTERVENTION (PCI-S): SHX6016

## 2020-05-10 DIAGNOSIS — Z952 Presence of prosthetic heart valve: Secondary | ICD-10-CM | POA: Insufficient documentation

## 2020-05-11 DIAGNOSIS — Z952 Presence of prosthetic heart valve: Secondary | ICD-10-CM

## 2020-05-11 HISTORY — DX: Presence of prosthetic heart valve: Z95.2

## 2020-05-11 HISTORY — PX: TRANSCATHETER AORTIC VALVE REPLACEMENT, TRANSFEMORAL: SHX6400

## 2020-11-22 ENCOUNTER — Other Ambulatory Visit: Payer: Self-pay | Admitting: Internal Medicine

## 2020-11-22 DIAGNOSIS — Z1231 Encounter for screening mammogram for malignant neoplasm of breast: Secondary | ICD-10-CM

## 2020-12-10 ENCOUNTER — Encounter: Payer: Self-pay | Admitting: Medical Oncology

## 2020-12-10 ENCOUNTER — Emergency Department
Admission: EM | Admit: 2020-12-10 | Discharge: 2020-12-10 | Disposition: A | Discharge status: Home / Self Care | Payer: Medicare Other | Attending: Emergency Medicine | Admitting: Emergency Medicine

## 2020-12-10 ENCOUNTER — Other Ambulatory Visit: Payer: Self-pay

## 2020-12-10 ENCOUNTER — Emergency Department: Payer: Medicare Other

## 2020-12-10 DIAGNOSIS — Z96653 Presence of artificial knee joint, bilateral: Secondary | ICD-10-CM | POA: Insufficient documentation

## 2020-12-10 DIAGNOSIS — K529 Noninfective gastroenteritis and colitis, unspecified: Secondary | ICD-10-CM | POA: Insufficient documentation

## 2020-12-10 DIAGNOSIS — Z79899 Other long term (current) drug therapy: Secondary | ICD-10-CM | POA: Insufficient documentation

## 2020-12-10 DIAGNOSIS — R251 Tremor, unspecified: Secondary | ICD-10-CM | POA: Insufficient documentation

## 2020-12-10 DIAGNOSIS — Z87891 Personal history of nicotine dependence: Secondary | ICD-10-CM | POA: Diagnosis not present

## 2020-12-10 DIAGNOSIS — E119 Type 2 diabetes mellitus without complications: Secondary | ICD-10-CM | POA: Insufficient documentation

## 2020-12-10 DIAGNOSIS — Z20822 Contact with and (suspected) exposure to covid-19: Secondary | ICD-10-CM | POA: Diagnosis not present

## 2020-12-10 DIAGNOSIS — R197 Diarrhea, unspecified: Secondary | ICD-10-CM | POA: Diagnosis present

## 2020-12-10 DIAGNOSIS — I10 Essential (primary) hypertension: Secondary | ICD-10-CM | POA: Insufficient documentation

## 2020-12-10 LAB — COMPREHENSIVE METABOLIC PANEL
ALT: 53 U/L — ABNORMAL HIGH (ref 0–44)
AST: 46 U/L — ABNORMAL HIGH (ref 15–41)
Albumin: 3.5 g/dL (ref 3.5–5.0)
Alkaline Phosphatase: 57 U/L (ref 38–126)
Anion gap: 9 (ref 5–15)
BUN: 12 mg/dL (ref 8–23)
CO2: 25 mmol/L (ref 22–32)
Calcium: 8.9 mg/dL (ref 8.9–10.3)
Chloride: 106 mmol/L (ref 98–111)
Creatinine, Ser: 0.85 mg/dL (ref 0.44–1.00)
GFR, Estimated: >60 mL/min (ref >60)
Glucose, Bld: 160 mg/dL — ABNORMAL HIGH (ref 70–99)
Potassium: 3.5 mmol/L (ref 3.5–5.1)
Sodium: 140 mmol/L (ref 135–145)
Total Bilirubin: 0.7 mg/dL (ref 0.3–1.2)
Total Protein: 6.7 g/dL (ref 6.5–8.1)

## 2020-12-10 LAB — URINALYSIS, COMPLETE (UACMP) WITH MICROSCOPIC
Bilirubin Urine: NEGATIVE
Glucose, UA: NEGATIVE mg/dL
Hgb urine dipstick: NEGATIVE
Ketones, ur: NEGATIVE mg/dL
Leukocytes,Ua: NEGATIVE
Nitrite: NEGATIVE
Protein, ur: NEGATIVE mg/dL
Specific Gravity, Urine: 1.033 — ABNORMAL HIGH (ref 1.005–1.030)
pH: 5 (ref 5.0–8.0)

## 2020-12-10 LAB — LACTIC ACID, PLASMA: Lactic Acid, Venous: 1.9 mmol/L (ref 0.5–1.9)

## 2020-12-10 LAB — CBC WITH DIFFERENTIAL/PLATELET
Abs Immature Granulocytes: 0.02 10*3/uL (ref 0.00–0.07)
Basophils Absolute: 0 10*3/uL (ref 0.0–0.1)
Basophils Relative: 0 %
Eosinophils Absolute: 0.2 10*3/uL (ref 0.0–0.5)
Eosinophils Relative: 3 %
HCT: 42.2 % (ref 36.0–46.0)
Hemoglobin: 14.4 g/dL (ref 12.0–15.0)
Immature Granulocytes: 0 %
Lymphocytes Relative: 9 %
Lymphs Abs: 0.7 10*3/uL (ref 0.7–4.0)
MCH: 30.6 pg (ref 26.0–34.0)
MCHC: 34.1 g/dL (ref 30.0–36.0)
MCV: 89.6 fL (ref 80.0–100.0)
Monocytes Absolute: 0.2 10*3/uL (ref 0.1–1.0)
Monocytes Relative: 3 %
Neutro Abs: 6.2 10*3/uL (ref 1.7–7.7)
Neutrophils Relative %: 85 %
Platelets: 183 10*3/uL (ref 150–400)
RBC: 4.71 MIL/uL (ref 3.87–5.11)
RDW: 13.4 % (ref 11.5–15.5)
WBC: 7.3 10*3/uL (ref 4.0–10.5)
nRBC: 0 % (ref 0.0–0.2)

## 2020-12-10 LAB — RESP PANEL BY RT-PCR (FLU A&B, COVID) ARPGX2
Influenza A by PCR: NEGATIVE
Influenza B by PCR: NEGATIVE
SARS Coronavirus 2 by RT PCR: NEGATIVE

## 2020-12-10 LAB — LIPASE, BLOOD: Lipase: 30 U/L (ref 11–51)

## 2020-12-10 LAB — TROPONIN I (HIGH SENSITIVITY)
Troponin I (High Sensitivity): 10 ng/L (ref <18)
Troponin I (High Sensitivity): 16 ng/L (ref <18)

## 2020-12-10 MED ORDER — AZITHROMYCIN 250 MG PO TABS: ORAL_TABLET | ORAL | 0 refills | Status: DC

## 2020-12-10 MED ORDER — IOHEXOL 300 MG/ML  SOLN
100.0000 mL | Freq: Once | INTRAMUSCULAR | Status: AC | PRN
  Administered 2020-12-10: 100 mL via INTRAVENOUS

## 2020-12-10 NOTE — ED Triage Notes (Addendum)
Pt reports that she went to her PCP today for episodes of diarrhea that she has been having and pt was placed on an antibiotic today (bactrim), about 10 mins after taking first dose of drug pt began having uncontrolled shakes and chills. Pt A/O x 4. Denies any fevers at home.

## 2020-12-10 NOTE — ED Provider Notes (Signed)
[facility] Emergency Department Provider Note  ____________________________________________   Event Date/Time   First MD Initiated Contact with Patient 12/10/20 1840     (approximate)  I have reviewed the triage vital signs and the nursing notes.   HISTORY  Chief Complaint Shaking   HPI Brandy Oneill is a 70 y.o. female presents to the ED with complaint of "shaking".  Patient states that she saw her PCP today after having several days of diarrhea.  She states the most diarrhea she has had was yesterday which was 4 times.  She is only had diarrhea once today.  Patient reports that her PCP placed her on Bactrim DS for her "diarrhea" and after taking the first dose she began having "shaking spell".  Patient also reports feeling feverish, chills, nausea without vomiting.  She has occasionally had a cough.  Patient recently returned from [place] and is unaware of any known exposure to [place].  Has had 3 doses of the Pfizer vaccine.  She has been using over-the-counter medications to try and help control her diarrhea including Pepto-Bismol.  No one else in her family is sick with similar symptoms.  She does have a history of hypertension but states she did not take any of her prescribed medications today.       Past Medical History:  Diagnosis Date  . Anxiety   . Arthritis   . Depression   . Diverticulosis 2016  . External hemorrhoid   . Family history of adverse reaction to anesthesia    sister becomes disoriented  . 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 History of kidney stones   . Hyperlipidemia   . Hypertension   . PONV (postoperative nausea and vomiting) 2001   kidney stone  . Pre-diabetes   . Psoriasis   . Tubular adenoma of colon     Patient Active Problem List   Diagnosis Date Noted  . Essential hypertension 02/06/2019  . Hyperlipidemia 02/06/2019  . Spinal stenosis of lumbar region with radiculopathy 09/27/2018  . BMI  38.0-38.9,adult 09/26/2018  . Back pain 09/05/2018  . Diet-controlled type 2 diabetes mellitus ([place]) 03/27/2018  . Postmenopausal 03/27/2018  . DJD (degenerative joint disease) 02/04/2018  . History of angioedema 09/17/2017  . Hyperglycemia 09/17/2017  . Lymphedema 02/04/2017  . Chronic venous insufficiency 02/04/2017  . Swelling of limb 02/04/2017  . Pure hypercholesterolemia 10/02/2016  . History of total knee arthroplasty 09/20/2016  . Varicose veins of both legs with edema 03/25/2016  . Abnormal mammogram 06/01/2015  . Urinary incontinence in female 06/01/2015  . Prediabetes 06/16/2014  . Anxiety and depression 01/28/2014  . Psoriasis 01/28/2014    Past Surgical History:  Procedure Laterality Date  . bladder stimulater  2014  . CATARACT EXTRACTION W/ INTRAOCULAR LENS IMPLANT Bilateral 2014   one eye done then the other eye done a month later  . CESAREAN SECTION    . COLONOSCOPY WITH PROPOFOL N/A 06/26/2018   Procedure: COLONOSCOPY WITH PROPOFOL;  Surgeon: Toledo, Benay Pike, MD;  Location: [facility] ENDOSCOPY;  Service: Gastroenterology;  Laterality: N/A;  . EYE SURGERY Right 2018   macular hole  . JOINT REPLACEMENT Right 10/27/2016  . JOINT REPLACEMENT Left 2014  . KNEE CLOSED REDUCTION Right 11/29/2016   Procedure: CLOSED MANIPULATION KNEE;  Surgeon: Leanor Kail, MD;  Location: [facility] ORS;  Service: Orthopedics;  Laterality: Right;    Prior to Admission medications   Medication Sig Start Date End Date Taking? Authorizing Provider  azithromycin (ZITHROMAX Z-PAK) 250 MG tablet Take 2 tablets (500 mg) on  Day 1,  followed by 1 tablet (250 mg) once daily on Days 2 through 5. 12/10/20  Yes Duffy Bruce, MD  albuterol (PROVENTIL HFA;VENTOLIN HFA) 108 (90 Base) MCG/ACT inhaler Inhale into the lungs every 6 (six) hours as needed for wheezing or shortness of breath.    [provider]  amLODipine (NORVASC) 2.5 MG tablet Take 2.5 mg by mouth daily.    [provider]   EPINEPHrine (EPIPEN IJ) Inject as directed.    [provider]  fluticasone (FLONASE) 50 MCG/ACT nasal spray SHAKE LQ AND U 1 SPR IEN QD 12/20/17   [provider]  lisinopril (PRINIVIL,ZESTRIL) 40 MG tablet Take 40 mg by mouth daily. 10/26/16   [provider]  metoprolol succinate (TOPROL-XL) 25 MG 24 hr tablet Take 25 mg by mouth daily. 10/02/16   [provider]  Multiple Vitamin (MULTIVITAMIN WITH MINERALS) TABS tablet Take 1 tablet by mouth daily. Centrum for Women    [provider]  oxybutynin (DITROPAN) 5 MG tablet Take 1 tablet (5 mg total) by mouth 2 (two) times daily. 04/14/19   Bjorn Loser, MD  pravastatin (PRAVACHOL) 10 MG tablet Take 10 mg by mouth every evening. 10/02/16   [provider]  venlafaxine (EFFEXOR) 37.5 MG tablet Take 37.5 mg by mouth daily. 08/29/16   [provider]    Allergies Kiwi extract  Family History  Problem Relation Age of Onset  . Breast cancer Neg Hx     Social History Social History   Tobacco Use  . Smoking status: Former Smoker    Quit date: 11/27/2012    Years since quitting: 8.0  . Smokeless tobacco: Never Used  Substance Use Topics  . Drug use: No    Review of Systems Constitutional: No fever/chills Eyes: No visual changes. ENT: No sore throat. Cardiovascular: Denies chest pain. Respiratory: Denies shortness of breath. Gastrointestinal: No abdominal pain.  No nausea, no vomiting.  No diarrhea.  No constipation. Genitourinary: Negative for dysuria. Musculoskeletal: Negative for back pain. Skin: Negative for rash. Neurological: Negative for headaches, focal weakness or numbness. Psychiatric:  Anxiety/depression. Endocrine:  History prediabetes. Hematological/Lymphatic:   ____________________________________________   PHYSICAL EXAM:  VITAL SIGNS: ED Triage Vitals  Enc Vitals Group     BP 12/10/20 1815 (!) 177/102     Pulse Rate 12/10/20 1815 90     Resp  12/10/20 1815 20     Temp 12/10/20 1815 99.5 F (37.5 C)     Temp Source 12/10/20 1815 Oral     SpO2 12/10/20 1815 98 %     Weight 12/10/20 1816 229 lb (103.9 kg)     Height 12/10/20 1816 5\' 3"  (1.6 m)     Head Circumference --      Peak Flow --      Pain Score 12/10/20 1816 0     Pain Loc --      Pain Edu? --      Excl. in [place]? --     Constitutional: Alert and oriented. Well appearing and in no acute distress.  Patient is able to talk in complete sentences and answers questions appropriately with family member present. Eyes: Conjunctivae are normal.  Head: Atraumatic. Neck: No stridor.   Cardiovascular: Normal rate, regular rhythm. Grossly normal heart sounds.  Good peripheral circulation. Respiratory: Normal respiratory effort.  No retractions. Lungs CTAB. Gastrointestinal: Soft and nontender. No distention.  Bowel sounds normoactive  x4 quadrants.  No CVA tenderness. Musculoskeletal: No tenderness is noted on palpation of thoracic or lumbar spine.  Patient is able move upper and lower extremities without any difficulty.  No shaking of her extremities is noted during exam.  No edema noted lower extremities.  No discoloration or injury to the skin is noted.  Pulses are present bilaterally. Neurologic:  Normal speech and language. No gross focal neurologic deficits are appreciated.  Skin:  Skin is warm, dry and intact. No rash noted. Psychiatric: Mood and affect are normal. Speech and behavior are normal.  ____________________________________________   LABS (all labs ordered are listed, but only abnormal results are displayed)  Labs Reviewed  COMPREHENSIVE METABOLIC PANEL - Abnormal; Notable for the following components:      Result Value   Glucose, Bld 160 (*)    AST 46 (*)    ALT 53 (*)    All other components within normal limits  URINALYSIS, COMPLETE (UACMP) WITH MICROSCOPIC - Abnormal; Notable for the following components:   Color, Urine YELLOW (*)    APPearance CLEAR (*)     Specific Gravity, Urine 1.033 (*)    Bacteria, UA RARE (*)    All other components within normal limits  RESP PANEL BY RT-PCR (FLU A&B, COVID) ARPGX2  CBC WITH DIFFERENTIAL/PLATELET  LIPASE, BLOOD  LACTIC ACID, PLASMA  TROPONIN I (HIGH SENSITIVITY)  TROPONIN I (HIGH SENSITIVITY)     PROCEDURES  Procedure(s) performed (including Critical Care):  Procedures   ____________________________________________   INITIAL IMPRESSION / ASSESSMENT AND PLAN / ED COURSE  As part of my medical decision making, I reviewed the following data within the electronic MEDICAL RECORD NUMBER Notes from prior ED visits and [place] Controlled Substance Database  ----------------------------------------- 8:29 PM on 12/10/2020 -----------------------------------------   70 year old female presents to the ED with complaint of shaking after taking a Bactrim DS.  Patient states that for the last 3 days she has had diarrhea with the most episodes being yesterday with 4 times.  She is only had diarrhea once today as she has been using over-the-counter medication help alleviate this problem.  She also saw her PCP who placed her on Bactrim DS for "diarrhea".  Patient reports fever, chills, nausea without vomiting and also an occasional cough.  She recently went to [place] and is unaware of any known exposure to COVID.  Patient also has a history of hypertension and has not taken her blood pressure medication today.  Lab work is pending at this time as well as a COVID and influenza swab.  Care of this patient is being turned over to Dr. Ellender Hose at this time.  ____________________________________________   FINAL CLINICAL IMPRESSION(S) / ED DIAGNOSES  Final diagnoses:  Colitis     ED Discharge Orders         Ordered    azithromycin (ZITHROMAX Z-PAK) 250 MG tablet        12/10/20 2252          *Please note:  Brandy Oneill was evaluated in Emergency Department on 12/11/2020 for the symptoms described in the  history of present illness. She was evaluated in the context of the global COVID-19 pandemic, which necessitated consideration that the patient might be at risk for infection with the SARS-CoV-2 virus that causes COVID-19. Institutional protocols and algorithms that pertain to the evaluation of patients at risk for COVID-19 are in a state of rapid change based on information released by regulatory bodies including the CDC and federal and state  organizations. These policies and algorithms were followed during the patient's care in the ED.  Some ED evaluations and interventions may be delayed as a result of limited staffing during and the pandemic.*   Note:  This document was prepared using Dragon voice recognition software and may include unintentional dictation errors.    Johnn Hai, PA-C 12/11/20 [id number]    Duffy Bruce, MD 12/12/20 Darlin Drop

## 2020-12-10 NOTE — Discharge Instructions (Addendum)
I would STOP the Bactrim  Start the Azithromycin tomorrow  Drink plenty of fluids  Continue your other medications

## 2020-12-14 ENCOUNTER — Other Ambulatory Visit: Payer: Self-pay

## 2020-12-14 ENCOUNTER — Ambulatory Visit
Admission: RE | Admit: 2020-12-14 | Discharge: 2020-12-14 | Disposition: A | Discharge status: Home / Self Care | Payer: Medicare Other | Source: Ambulatory Visit | Attending: Internal Medicine | Admitting: Internal Medicine

## 2020-12-14 DIAGNOSIS — Z1231 Encounter for screening mammogram for malignant neoplasm of breast: Secondary | ICD-10-CM | POA: Diagnosis present

## 2020-12-20 ENCOUNTER — Other Ambulatory Visit: Payer: Self-pay | Admitting: Internal Medicine

## 2020-12-20 DIAGNOSIS — R928 Other abnormal and inconclusive findings on diagnostic imaging of breast: Secondary | ICD-10-CM

## 2020-12-20 DIAGNOSIS — N6310 Unspecified lump in the right breast, unspecified quadrant: Secondary | ICD-10-CM

## 2021-01-03 ENCOUNTER — Ambulatory Visit (INDEPENDENT_AMBULATORY_CARE_PROVIDER_SITE_OTHER): Payer: Medicare Other | Admitting: Vascular Surgery

## 2021-01-06 ENCOUNTER — Other Ambulatory Visit (HOSPITAL_COMMUNITY): Payer: Self-pay | Admitting: FAMILY PRACTICE

## 2021-02-24 ENCOUNTER — Other Ambulatory Visit: Payer: Self-pay

## 2021-02-24 ENCOUNTER — Encounter (HOSPITAL_BASED_OUTPATIENT_CLINIC_OR_DEPARTMENT_OTHER): Payer: Self-pay | Admitting: DERMATOLOGY

## 2021-02-24 ENCOUNTER — Ambulatory Visit: Payer: Medicare Other | Attending: FAMILY PRACTICE | Admitting: DERMATOLOGY

## 2021-02-24 VITALS — BP 159/84 | HR 85 | Ht 63.5 in | Wt 228.8 lb

## 2021-02-24 DIAGNOSIS — X32XXXA Exposure to sunlight, initial encounter: Secondary | ICD-10-CM

## 2021-02-24 DIAGNOSIS — Z1283 Encounter for screening for malignant neoplasm of skin: Secondary | ICD-10-CM

## 2021-02-24 DIAGNOSIS — L820 Inflamed seborrheic keratosis: Secondary | ICD-10-CM

## 2021-02-24 DIAGNOSIS — L821 Other seborrheic keratosis: Secondary | ICD-10-CM | POA: Insufficient documentation

## 2021-02-24 DIAGNOSIS — L409 Psoriasis, unspecified: Secondary | ICD-10-CM | POA: Insufficient documentation

## 2021-02-24 MED ORDER — CALCIPOTRIENE 0.005 % TOPICAL OINTMENT
TOPICAL_OINTMENT | Freq: Two times a day (BID) | CUTANEOUS | 2 refills | Status: DC
Start: 2021-02-24 — End: 2021-02-24

## 2021-02-24 MED ORDER — TRIAMCINOLONE ACETONIDE 0.1 % TOPICAL OINTMENT
TOPICAL_OINTMENT | Freq: Two times a day (BID) | CUTANEOUS | 3 refills | Status: AC
Start: 2021-02-24 — End: 2021-05-25

## 2021-02-24 MED ORDER — CALCIPOTRIENE 0.005 % TOPICAL OINTMENT
TOPICAL_OINTMENT | CUTANEOUS | 3 refills | Status: AC
Start: 2021-02-25 — End: 2021-05-26

## 2021-02-24 NOTE — Progress Notes (Signed)
Dermatology Clinic, [facility]  [address]  [place] [address]  [phone]    Date:   02/24/2021  Name: Elizabeth Castaneda  Age: 70 y.o.  Last visit: Visit date not found    Chief complaint:     HPI  Elizabeth Castaneda is a 70 y.o. female with no personal or family history of skin cancer, presenting for psoriasis and a total body skin exam. Patient states first developed lesions on her elbows and leg in 2000 following a flu-like illness. Shortly after she began seeing a dermatologist and was diagnosed with psoriasis. She has no family history of psoriasis. She has been using clobetasol ointment and vaseline and all of her lesions have resolved except for those on her elbow. She does not endorse joint pain at this time. No other new, changing, bleeding, or symptomatic lesions or moles.       ROS   Constitutional: Negative for chills and fever.   Skin: Positive for itching and rash      Current Medications  Outpatient Encounter Medications as of 02/24/2021   Medication Sig Dispense Refill    [START ON 02/25/2021] calcipotriene (DOVONEX) 0.005 % Ointment Apply topically Every Monday, Wednesday and Friday for 90 days 60 g 3    clopidogreL (PLAVIX) 75 mg Oral Tablet Take 75 mg by mouth Once a day      lisinopriL (PRINIVIL) 40 mg Oral Tablet Take 40 mg by mouth Once a day      oxybutynin (DITROPAN) 5 mg Oral Tablet Take 5 mg by mouth Twice daily      triamcinolone acetonide (ARISTOCORT A) 0.1 % Ointment Apply topically Twice daily for 90 days 454 g 3    venlafaxine (EFFEXOR) 75 mg Oral Tablet Take 75 mg by mouth      [DISCONTINUED] calcipotriene (DOVONEX) 0.005 % Ointment Apply topically Twice daily for 90 days 120 g 2     No facility-administered encounter medications on file as of 02/24/2021.        Allergies   Allergen Reactions    Sulfa (Sulfonamides)         Past Medical History:   Diagnosis Date    Hypertension     Psoriasis           Physical Exam  Vitals:    HR:  [85] 85 (07/07  1225)  BP: (159)/(84) 159/84 (07/07 1225)  SpO2:  [98 %] 98 % (07/07 1225)   Vitals:    02/24/21 1225   BP: (!) 159/84   Pulse: 85   SpO2: 98%   Weight: 104 kg (228 lb 13.4 oz)   Height: 1.613 m (5' 3.5")        Physical Exam  Skin:              Constitutional: she appears well-developed and well-nourished.   Skin: Skin is warm and dry.     General skin exam was performed including head, neck, anterior/posterior trunk, bilateral upper and lower extremities and revealed no areas of concern other than those documented.     Assessment and Plan  70 yo F with PMH of psoriasis presenting for management of plaques on her elbows and a full body skin check.    #1 Psoriasis (#1 on diagram above)  -Discontinue clobetasol ointment, start triamcinolone 0.1% ointment BID to elbows  -Start calcipotriene 0.005% ointment 3x weekly to affected areas  -Plan to return to clinic to assess efficacy of therapy    #  2 Seborrheic keratoses (#2 on diagram above)  -Explained benign nature of disease   -Treated 2 lesions that were bothersome to patient with cryotherapy for 5 seconds each x2    #Sun Exposure  Skin Cancer Screening   - Recommend protection from ultraviolet radiation from the sun and avoidance of sunburns and tanning beds. Recommend broad spectrum sunscreen daily with at least SPF 30, especially one that contains zinc and titanium. Reapply sunscreen every two hours. It is important to wear protective clothing and avoid the mid-day sun (approx 10AM till 2PM) when possible   - Recommend self-assessment using the ABCDE's of melanoma to locate areas of concern: Asymmetry, Border irregularity, Color variation, Diameter (>30mm), Evolution of lesion.     RTC 3 months or sooner if needed.    Corbin Ade, MD 02/24/2021 13:16  PGY-2 Dermatology  [facility]   See resident's note for details. I saw and examined the patient and agree with the resident's findings and plan as written except as noted, and I was present and  supervised/observed all procedures in their entirety.    Miles Costain, MD 02/24/2021, 14:17

## 2021-03-02 ENCOUNTER — Ambulatory Visit (HOSPITAL_BASED_OUTPATIENT_CLINIC_OR_DEPARTMENT_OTHER): Payer: Self-pay | Admitting: DERMATOLOGY

## 2021-03-02 NOTE — Telephone Encounter (Signed)
PA submitted to [facility] for Calcipotriene. Determination to be faxed to office.     Gordy Clement, RN  03/02/2021, 12:03

## 2021-03-03 ENCOUNTER — Ambulatory Visit (HOSPITAL_BASED_OUTPATIENT_CLINIC_OR_DEPARTMENT_OTHER): Payer: Self-pay | Admitting: DERMATOLOGY

## 2021-03-03 NOTE — Telephone Encounter (Signed)
Calcipotriene 0.005% cream    Approved on July 13  PA Case: [id number],   Status: Approved,   Coverage Starts on: 08/21/2020 12:00:00 AM, Coverage Ends on: 08/20/2021 12:00:00 AM. Questions? Contact [phone].    Gordy Clement, RN  03/03/2021, 09:18

## 2021-03-10 ENCOUNTER — Telehealth (HOSPITAL_BASED_OUTPATIENT_CLINIC_OR_DEPARTMENT_OTHER): Payer: Self-pay | Admitting: DERMATOLOGY

## 2021-03-10 NOTE — Telephone Encounter (Signed)
Received fax from patient's pharmacy stating calcipotriene ointment is not covered by insurance and topical steroid is recommended alternative. Called patient and she states that she is currently using prescribed triamcinolone and that plaques are under control.   Corbin Ade, MD  03/10/2021, 13:35

## 2021-03-15 ENCOUNTER — Ambulatory Visit (HOSPITAL_BASED_OUTPATIENT_CLINIC_OR_DEPARTMENT_OTHER): Payer: Medicare Other | Admitting: Pediatric Allergy/Immunology

## 2021-05-04 ENCOUNTER — Encounter (HOSPITAL_BASED_OUTPATIENT_CLINIC_OR_DEPARTMENT_OTHER): Payer: Self-pay | Admitting: DERMATOLOGY

## 2021-05-30 ENCOUNTER — Telehealth (INDEPENDENT_AMBULATORY_CARE_PROVIDER_SITE_OTHER): Payer: Self-pay | Admitting: Nurse Practitioner

## 2021-05-30 NOTE — Telephone Encounter (Signed)
Made pt aware that she will need to come in a have a FU to have a new order written.

## 2021-06-07 ENCOUNTER — Ambulatory Visit (HOSPITAL_BASED_OUTPATIENT_CLINIC_OR_DEPARTMENT_OTHER): Payer: Medicare Other | Admitting: Pediatric Allergy/Immunology

## 2021-06-20 ENCOUNTER — Ambulatory Visit (INDEPENDENT_AMBULATORY_CARE_PROVIDER_SITE_OTHER): Payer: Medicare Other | Admitting: Vascular Surgery

## 2021-06-20 ENCOUNTER — Other Ambulatory Visit: Payer: Self-pay

## 2021-06-20 ENCOUNTER — Encounter (INDEPENDENT_AMBULATORY_CARE_PROVIDER_SITE_OTHER): Payer: Self-pay | Admitting: Vascular Surgery

## 2021-06-20 VITALS — BP 114/74 | HR 87 | Ht 63.0 in | Wt 228.0 lb

## 2021-06-20 DIAGNOSIS — I890 Lymphedema, not elsewhere classified: Secondary | ICD-10-CM | POA: Diagnosis not present

## 2021-06-20 DIAGNOSIS — I872 Venous insufficiency (chronic) (peripheral): Secondary | ICD-10-CM

## 2021-06-20 DIAGNOSIS — E119 Type 2 diabetes mellitus without complications: Secondary | ICD-10-CM | POA: Diagnosis not present

## 2021-06-20 DIAGNOSIS — I10 Essential (primary) hypertension: Secondary | ICD-10-CM

## 2021-06-20 DIAGNOSIS — M159 Polyosteoarthritis, unspecified: Secondary | ICD-10-CM

## 2021-06-20 NOTE — Progress Notes (Signed)
MRN : [id number]  Brandy Oneill is a 70 y.o. (1950/09/19) female who presents with chief complaint of check up for leg swelling.  History of Present Illness:   The patient returns to the office for followup evaluation regarding leg swelling.  The swelling has persisted but with the lymph pump is much, much better controlled. The pain associated with swelling is essentially eliminated. There have not been any interval development of a ulcerations or wounds.  The patient denies problems with the pump, noting it is working well and the leggings are in good condition.  Since the previous visit the patient has been wearing graduated compression stockings and using the lymph pump on a routine basis and  has noted significant improvement in the lymphedema.   Patient stated the lymph pump has been a very positive factor in her care.    No outpatient medications have been marked as taking for the 06/20/21 encounter (Appointment) with Delana Meyer, Dolores Lory, MD.    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   History of kidney stones    Hyperlipidemia    Hypertension    PONV (postoperative nausea and vomiting) 2001   kidney stone   Pre-diabetes    Psoriasis    Tubular adenoma of colon     Past Surgical History:  Procedure Laterality Date   bladder stimulater  2014   CATARACT EXTRACTION W/ INTRAOCULAR LENS IMPLANT Bilateral 2014   one eye done then the other eye done a month later   [place] N/A 06/26/2018   Procedure: COLONOSCOPY WITH PROPOFOL;  Surgeon: Toledo, Benay Pike, MD;  Location: [facility] ENDOSCOPY;  Service: Gastroenterology;  Laterality: N/A;   EYE SURGERY Right 2018   macular hole   JOINT REPLACEMENT Right 10/27/2016   JOINT  REPLACEMENT Left 2014   KNEE CLOSED REDUCTION Right 11/29/2016   Procedure: CLOSED MANIPULATION KNEE;  Surgeon: Leanor Kail, MD;  Location: [facility] ORS;  Service: Orthopedics;  Laterality: Right;    Social History Social History   Tobacco Use   Smoking status: Former    Types: Cigarettes    Quit date: 11/27/2012    Years since quitting: 8.5   Smokeless tobacco: Never  Substance Use Topics   Drug use: No    Family History Family History  Problem Relation Age of Onset   Breast cancer Neg Hx     Allergies  Allergen Reactions   Kiwi Extract      REVIEW OF SYSTEMS (Negative unless checked)  Constitutional: [] Weight loss  [] Fever  [] Chills Cardiac: [] Chest pain   [] Chest pressure   [] Palpitations   [] Shortness of breath when laying flat   [] Shortness of breath with exertion. Vascular:  [] Pain in legs with walking   [] Pain in legs at rest  [] History of DVT   [] Phlebitis   [x] Swelling in legs   [] Varicose veins   [] Non-healing ulcers Pulmonary:   [] Uses home oxygen   [] Productive cough   [] Hemoptysis   [] Wheeze  [] COPD   [] Asthma Neurologic:  [] Dizziness   [] Seizures   [] History of stroke   [] History of TIA  [] Aphasia   [] Vissual changes   [] Weakness or numbness in arm   [] Weakness or  numbness in leg Musculoskeletal:   [] Joint swelling   [] Joint pain   [] Low back pain Hematologic:  [] Easy bruising  [] Easy bleeding   [] Hypercoagulable state   [] Anemic Gastrointestinal:  [] Diarrhea   [] Vomiting  [] Gastroesophageal reflux/heartburn   [] Difficulty swallowing. Genitourinary:  [] Chronic kidney disease   [] Difficult urination  [] Frequent urination   [] Blood in urine Skin:  [] Rashes   [] Ulcers  Psychological:  [] History of anxiety   []  History of major depression.  Physical Examination  There were no vitals filed for this visit. There is no height or weight on file to calculate BMI. Gen: WD/WN, NAD Head: [place]/AT, No temporalis wasting.  Ear/Nose/Throat: Hearing grossly intact, nares w/o  erythema or drainage, pinna without lesions Eyes: PER, EOMI, sclera nonicteric.  Neck: Supple, no gross masses.  No JVD.  Pulmonary:  Good air movement, no audible wheezing, no use of accessory muscles.  Cardiac: RRR, precordium not hyperdynamic. Vascular:  scattered varicosities present bilaterally.  Mild venous stasis changes to the legs bilaterally.  2+ soft pitting edema skin in the ankle area is very supple and healthy Vessel Right Left  Radial Palpable Palpable  Gastrointestinal: soft, non-distended. No guarding/no peritoneal signs.  Musculoskeletal: M/S 5/5 throughout.  No deformity.  Neurologic: CN 2-12 intact. Pain and light touch intact in extremities.  Symmetrical.  Speech is fluent. Motor exam as listed above. Psychiatric: Judgment intact, Mood & affect appropriate for pt's clinical situation. Dermatologic: Venous rashes no ulcers noted.  No changes consistent with cellulitis. Lymph : No lichenification or skin changes of chronic lymphedema.  CBC Lab Results  Component Value Date   WBC 7.3 12/10/2020   HGB 14.4 12/10/2020   HCT 42.2 12/10/2020   MCV 89.6 12/10/2020   PLT 183 12/10/2020    BMET    Component Value Date/Time   NA 140 12/10/2020 2012   K 3.5 12/10/2020 2012   CL 106 12/10/2020 2012   CO2 25 12/10/2020 2012   GLUCOSE 160 (H) 12/10/2020 2012   BUN 12 12/10/2020 2012   CREATININE 0.85 12/10/2020 2012   CALCIUM 8.9 12/10/2020 2012   GFRNONAA >60 12/10/2020 2012   GFRAA >60 11/27/2016 1417   CrCl cannot be calculated (Patient's most recent lab result is older than the maximum 21 days allowed.).  COAG No results found for: INR, PROTIME  Radiology No results found.   Assessment/Plan 1. Lymphedema  No surgery or intervention at this point in time.    I have reviewed my discussion with the patient regarding lymphedema and why it  causes symptoms.  Patient will continue wearing graduated compression stockings class 1 (20-30 mmHg) on a daily basis a  prescription was given. The patient is reminded to put the stockings on first thing in the morning and removing them in the evening. The patient is instructed specifically not to sleep in the stockings.   In addition, behavioral modification throughout the day will be continued.  This will include frequent elevation (such as in a recliner), use of over the counter pain medications as needed and exercise such as walking.  The patient will continue aggressive use of the  lymph pump.  Her pump is now more than 72 years old and has been repaired twice.  Therefore, it is appropriate to obtain a new machine.  This will continue to improve the edema control and prevent sequela such as ulcers and infections.   The patient will follow-up with me on an annual basis.    2. Chronic venous insufficiency  No surgery or intervention at this point in time.    I have reviewed my discussion with the patient regarding lymphedema and why it  causes symptoms.  Patient will continue wearing graduated compression stockings class 1 (20-30 mmHg) on a daily basis a prescription was given. The patient is reminded to put the stockings on first thing in the morning and removing them in the evening. The patient is instructed specifically not to sleep in the stockings.   In addition, behavioral modification throughout the day will be continued.  This will include frequent elevation (such as in a recliner), use of over the counter pain medications as needed and exercise such as walking.  The patient will continue aggressive use of the  lymph pump.  Her pump is now more than 72 years old and has been repaired twice.  Therefore, it is appropriate to obtain a new machine.  This will continue to improve the edema control and prevent sequela such as ulcers and infections.   The patient will follow-up with me on an annual basis.   3. Essential hypertension Continue antihypertensive medications as already ordered, these medications have  been reviewed and there are no changes at this time.   4. Diet-controlled type 2 diabetes mellitus ([place]) Continue hypoglycemic medications as already ordered, these medications have been reviewed and there are no changes at this time.  Hgb A1C to be monitored as already arranged by primary service   5. Primary osteoarthritis involving multiple joints Continue NSAID medications as already ordered, these medications have been reviewed and there are no changes at this time.  Continued activity and therapy was stressed.    Hortencia Pilar, MD  06/20/2021 11:02 AM

## 2021-07-16 ENCOUNTER — Encounter: Payer: Self-pay | Admitting: Emergency Medicine

## 2021-07-16 ENCOUNTER — Emergency Department
Admission: EM | Admit: 2021-07-16 | Discharge: 2021-07-16 | Disposition: A | Discharge status: Home / Self Care | Payer: Medicare Other | Attending: Emergency Medicine | Admitting: Emergency Medicine

## 2021-07-16 ENCOUNTER — Other Ambulatory Visit: Payer: Self-pay

## 2021-07-16 ENCOUNTER — Emergency Department: Payer: Medicare Other

## 2021-07-16 DIAGNOSIS — M79672 Pain in left foot: Secondary | ICD-10-CM | POA: Diagnosis present

## 2021-07-16 DIAGNOSIS — Z7902 Long term (current) use of antithrombotics/antiplatelets: Secondary | ICD-10-CM | POA: Diagnosis not present

## 2021-07-16 DIAGNOSIS — I2510 Atherosclerotic heart disease of native coronary artery without angina pectoris: Secondary | ICD-10-CM | POA: Insufficient documentation

## 2021-07-16 DIAGNOSIS — W228XXA Striking against or struck by other objects, initial encounter: Secondary | ICD-10-CM | POA: Diagnosis not present

## 2021-07-16 DIAGNOSIS — Z79899 Other long term (current) drug therapy: Secondary | ICD-10-CM | POA: Insufficient documentation

## 2021-07-16 DIAGNOSIS — Z87891 Personal history of nicotine dependence: Secondary | ICD-10-CM | POA: Diagnosis not present

## 2021-07-16 DIAGNOSIS — I10 Essential (primary) hypertension: Secondary | ICD-10-CM | POA: Insufficient documentation

## 2021-07-16 DIAGNOSIS — E119 Type 2 diabetes mellitus without complications: Secondary | ICD-10-CM | POA: Insufficient documentation

## 2021-07-16 DIAGNOSIS — Z9660 Presence of unspecified orthopedic joint implant: Secondary | ICD-10-CM | POA: Diagnosis not present

## 2021-07-16 DIAGNOSIS — Z7982 Long term (current) use of aspirin: Secondary | ICD-10-CM | POA: Diagnosis not present

## 2021-07-16 DIAGNOSIS — R52 Pain, unspecified: Secondary | ICD-10-CM

## 2021-07-16 MED ORDER — HYDROCODONE-ACETAMINOPHEN 5-325 MG PO TABS: 1.0000 | ORAL_TABLET | Freq: Four times a day (QID) | ORAL | 0 refills | Status: AC | PRN

## 2021-07-16 NOTE — ED Triage Notes (Signed)
Pt reports stepped on a bottle cap on Halloween and bruised her left foot. Pt reports thought it would get better but has actually gotten worse.

## 2021-07-16 NOTE — ED Provider Notes (Signed)
[facility] Emergency Department Provider Note ____________________________________________  Time seen: Approximately 1:00 PM  I have reviewed the triage vital signs and the nursing notes.   HISTORY  Chief Complaint Foot Pain    HPI Brandy Oneill is a 70 y.o. female who presents to the emergency department for evaluation and treatment of left foot pain that has been present for almost a month after stepping on a bottle And bruised her left foot.  Pain has continued to worsen.  No relief with rest, elevation.  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   CAD (coronary artery disease) 04/28/2020   S/P angioplasty with stent 04/28/2020   Nonrheumatic aortic valve stenosis 03/12/2020   Essential hypertension 02/06/2019   Hyperlipidemia 02/06/2019   Spinal stenosis of lumbar region with radiculopathy 09/27/2018   BMI 38.0-38.9,adult 09/26/2018   Back pain 09/05/2018   Diet-controlled type 2 diabetes mellitus ([place]) 03/27/2018   Postmenopausal 03/27/2018   DJD (degenerative joint disease) 02/04/2018   History of angioedema 09/17/2017   Hyperglycemia 09/17/2017   Lymphedema 02/04/2017   Chronic venous insufficiency 02/04/2017   Swelling of limb 02/04/2017   Pure hypercholesterolemia 10/02/2016   History of total knee arthroplasty 09/20/2016   Varicose veins of both legs with edema 03/25/2016   Abnormal mammogram 06/01/2015   Urinary  incontinence in female 06/01/2015   Prediabetes 06/16/2014   Anxiety and depression 01/28/2014   Psoriasis 01/28/2014    Past Surgical History:  Procedure Laterality Date   bladder stimulater  2014   CATARACT EXTRACTION W/ INTRAOCULAR LENS IMPLANT Bilateral 2014   one eye done then the other eye done a month later   [place] N/A 06/26/2018   Procedure: COLONOSCOPY WITH PROPOFOL;  Surgeon: Toledo, Benay Pike, MD;  Location: [facility] ENDOSCOPY;  Service: Gastroenterology;  Laterality: N/A;   EYE SURGERY Right 2018   macular hole   JOINT REPLACEMENT Right 10/27/2016   JOINT REPLACEMENT Left 2014   KNEE CLOSED REDUCTION Right 11/29/2016   Procedure: CLOSED MANIPULATION KNEE;  Surgeon: Leanor Kail, MD;  Location: [facility] ORS;  Service: Orthopedics;  Laterality: Right;    Prior to Admission medications   Medication Sig Start Date End Date Taking? Authorizing Provider  HYDROcodone-acetaminophen (NORCO/VICODIN) 5-325 MG tablet Take 1 tablet by mouth every 6 (six) hours as needed for up to 3 days for severe pain. 07/16/21 07/19/21 Yes San Lohmeyer B, FNP  albuterol (PROVENTIL HFA;VENTOLIN HFA) 108 (90 Base) MCG/ACT inhaler Inhale into the lungs every 6 (six) hours as needed for wheezing or shortness of breath.    [provider]  amLODipine (NORVASC) 2.5 MG tablet Take 2.5 mg by mouth daily.    [provider]  aspirin 81 MG chewable tablet Take 4 tablets today, Sept 7, 2021 and then one each morning thereafter 04/27/20   [provider]  azithromycin (ZITHROMAX Z-PAK) 250 MG tablet Take 2 tablets (500 mg) on  Day 1,  followed by 1 tablet (250 mg) once daily on Days 2 through 5. 12/10/20   Duffy Bruce, MD  clobetasol ointment (TEMOVATE) 0.05 % Apply topically 2 (two) times daily. 03/31/21 03/31/22  [provider]  clopidogrel (PLAVIX) 75 MG tablet Take by mouth.    [provider]  EPINEPHrine (EPIPEN IJ) Inject as  directed.    [provider]  fluticasone (FLONASE) 50 MCG/ACT nasal spray SHAKE LQ AND U 1 SPR IEN QD 12/20/17   [provider]  lisinopril (PRINIVIL,ZESTRIL) 40 MG tablet Take 40 mg by mouth daily. 10/26/16   [provider]  metoprolol succinate (TOPROL-XL) 25 MG 24 hr tablet Take 25 mg by mouth daily. 10/02/16   [provider]  Multiple Vitamin (MULTIVITAMIN WITH MINERALS) TABS tablet Take 1 tablet by mouth daily. Centrum for Women    [provider]  oxybutynin (DITROPAN) 5 MG tablet Take 1 tablet (5 mg total) by mouth 2 (two) times daily. 04/14/19   Bjorn Loser, MD  pravastatin (PRAVACHOL) 10 MG tablet Take 10 mg by mouth every evening. 10/02/16   [provider]  venlafaxine (EFFEXOR) 37.5 MG tablet Take 37.5 mg by mouth daily. 08/29/16   [provider]    Allergies Kiwi extract and Sulfa antibiotics  Family History  Problem Relation Age of Onset   Breast cancer Neg Hx     Social History Social History   Tobacco Use   Smoking status: Former    Types: Cigarettes    Quit date: 11/27/2012    Years since quitting: 8.6   Smokeless tobacco: Never  Substance Use Topics   Drug use: No    Review of Systems Constitutional: Negative for fever. Cardiovascular: Negative for chest pain. Respiratory: Negative for shortness of breath. Musculoskeletal: Positive for left lateral foot pain Skin: Positive for contusion to foot Neurological: Negative for decrease in sensation  ____________________________________________   PHYSICAL EXAM:  VITAL SIGNS: ED Triage Vitals  Enc Vitals Group     BP 07/16/21 0739 (!) 153/80     Pulse Rate 07/16/21 0739 83     Resp 07/16/21 0739 16     Temp 07/16/21 0739 98.1 F (36.7 C)     Temp Source 07/16/21 0739 Oral     SpO2 07/16/21 0739 95 %     Weight --      Height --      Head Circumference --      Peak Flow --      Pain Score 07/16/21 0736 9     Pain Loc --      Pain Edu?  --      Excl. in [place]? --     Constitutional: Alert and oriented. Well appearing and in no acute distress. Eyes: Conjunctivae are clear without discharge or drainage Head: Atraumatic Neck: Supple Respiratory: No cough. Respirations are even and unlabored. Musculoskeletal: Diffuse tenderness over the lateral aspect of the plantar surface of the left foot.  Patient able to demonstrate full range of motion. Neurologic: Motor and sensory function is intact. Skin: No induration, fluctuance, contusion, open wound noted over the lateral aspect of the left foot Psychiatric: Affect and behavior are appropriate.  ____________________________________________   LABS (all labs ordered are listed, but only abnormal results are displayed)  Labs Reviewed - No data to display ____________________________________________  RADIOLOGY  Image of the left foot and ankle negative  for acute bony abnormality or retained foreign body  I, Yoland Scherr, personally viewed and evaluated these images (plain radiographs) as part of my medical decision making, as well as reviewing the written report by the radiologist.  DG Ankle Complete Left  Result Date: 07/16/2021 CLINICAL DATA:  Left ankle pain and inability to bear weight. EXAM: LEFT ANKLE COMPLETE - 3+ VIEW COMPARISON:  None. FINDINGS: There is no evidence of fracture, dislocation, or joint effusion. Minimal degenerative spurring is seen involving the lateral and medial malleoli. No other focal bone abnormality. Soft tissues are unremarkable. IMPRESSION: No acute findings. Minimal degenerative spurring. Electronically Signed   By: Marlaine Hind M.D.   On: 07/16/2021 08:37   DG Foot Complete Left  Result Date: 07/16/2021 CLINICAL DATA:  Stepped on bottle cap 1 month ago. Persistent left foot pain and inability to bear weight. EXAM: LEFT FOOT - COMPLETE 3+ VIEW COMPARISON:  None. FINDINGS: There is no evidence of fracture or dislocation. No evidence of  osteolysis or periostitis. There is no evidence of arthropathy. Plantar and dorsal calcaneal bone spurs noted. Soft tissues are unremarkable. No evidence of radiopaque foreign body or soft tissue gas. IMPRESSION: No acute findings. Electronically Signed   By: Marlaine Hind M.D.   On: 07/16/2021 08:36   ____________________________________________   PROCEDURES  Procedures  ____________________________________________   INITIAL IMPRESSION / ASSESSMENT AND PLAN / ED COURSE  Brandy Oneill is a 70 y.o. who presents to the emergency department for treatment and evaluation of left foot pain.  See HPI for further details.  X-ray shows no acute concerns including retained foreign body.  Plan will be to have her wear a compression sock and postop shoe when she is up walking.  She is to elevate the foot often throughout the day.  No anti-inflammatories prescribed since she is currently on Plavix.  Patient states that the pain is very sharp and shooting and occasionally prevents sleep.  She will be given very limited course of Norco to be used for severe pain.  For symptoms that are not improving with compression and postop shoe with rest and elevation she is to follow-up with podiatry.  Medications - No data to display  Pertinent labs & imaging results that were available during my care of the patient were reviewed by me and considered in my medical decision making (see chart for details).   _________________________________________   FINAL CLINICAL IMPRESSION(S) / ED DIAGNOSES  Final diagnoses:  Foot pain, left    ED Discharge Orders          Ordered    HYDROcodone-acetaminophen (NORCO/VICODIN) 5-325 MG tablet  Every 6 hours PRN        07/16/21 0929             If controlled substance prescribed during this visit, 12 month history viewed on the [place] prior to issuing an initial prescription for Schedule II or III opiod.    Victorino Dike, FNP 07/16/21 1323    Vladimir Crofts,  MD 07/16/21 [phone]

## 2021-07-16 NOTE — Discharge Instructions (Signed)
Wear the compression sleeve as discussed. Wear the post op shoe any time you are up walking. Follow up with podiatry if not improving over the week. Return to the ER for symptoms that change or worsen if unable to schedule an appointment.

## 2021-07-19 ENCOUNTER — Other Ambulatory Visit: Payer: Self-pay

## 2021-07-19 ENCOUNTER — Ambulatory Visit (INDEPENDENT_AMBULATORY_CARE_PROVIDER_SITE_OTHER): Payer: Medicare Other | Admitting: Podiatry

## 2021-07-19 ENCOUNTER — Ambulatory Visit (INDEPENDENT_AMBULATORY_CARE_PROVIDER_SITE_OTHER): Payer: Medicare Other

## 2021-07-19 DIAGNOSIS — M7672 Peroneal tendinitis, left leg: Secondary | ICD-10-CM

## 2021-07-19 MED ORDER — BETAMETHASONE SOD PHOS & ACET 6 (3-3) MG/ML IJ SUSP
3.0000 mg | Freq: Once | INTRAMUSCULAR | Status: AC
Start: 2021-07-19 — End: 2021-07-19
  Administered 2021-07-19: 3 mg via INTRA_ARTICULAR

## 2021-07-19 MED ORDER — MELOXICAM 15 MG PO TABS: 15.0000 mg | ORAL_TABLET | Freq: Every day | ORAL | 1 refills | Status: DC

## 2021-07-19 NOTE — Progress Notes (Signed)
   HPI: 70 y.o. female presenting today for evaluation of left lateral foot pain.  Patient states that she began to have some pain when she stepped on a bottle And felt like a stone bruise.  This was on the lateral aspect of the foot.  DOI: 06/20/2021.  Patient states over the following week she got increased pain and tenderness so she went to the emergency department where x-rays were taken.  X-rays taken 07/16/2021 negative for fracture.  She was referred here for follow-up treatment and evaluation  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   History of kidney stones    Hyperlipidemia    Hypertension    PONV (postoperative nausea and vomiting) 2001   kidney stone   Pre-diabetes    Psoriasis    Tubular adenoma of colon      Physical Exam: General: The patient is alert and oriented x3 in no acute distress.  Dermatology: Skin is warm, dry and supple bilateral lower extremities. Negative for open lesions or macerations.  Vascular: Palpable pedal pulses bilaterally. No edema or erythema noted. Capillary refill within normal limits.  Neurological: Epicritic and protective threshold grossly intact bilaterally.   Musculoskeletal Exam: Pain on palpation along the peroneal tendon as it inserts onto the fifth metatarsal tubercle  Radiographic Exam 07/16/2021 LT foot: There is no evidence of fracture or dislocation. No evidence of osteolysis or periostitis. There is no evidence of arthropathy. Plantar and dorsal calcaneal bone spurs noted. Soft tissues are unremarkable. No evidence of radiopaque foreign body or soft tissue gas.  Assessment: 1.  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 Patient evaluated. X-Rays reviewed that were  taken in the ED.  2.  Injection of 0.5 cc Celestone Soluspan injected along fifth metatarsal tubercle area 3.  Prescription for meloxicam 15 mg daily 4.  Patient is currently wearing a postsurgical shoe.  Cam boot dispensed today to be weightbearing as tolerated.  This will immobilize the peroneal tendon and ankle joint to allow better stability and healing 5.  Return to clinic in 3 weeks      Edrick Kins, DPM [facility]  Dr. Edrick Kins, DPM    [address], [place] [address]                Office [phone]  Fax [phone]

## 2021-08-01 ENCOUNTER — Emergency Department
Admission: EM | Admit: 2021-08-01 | Discharge: 2021-08-01 | Disposition: A | Discharge status: Home / Self Care | Payer: Medicare Other | Attending: Emergency Medicine | Admitting: Emergency Medicine

## 2021-08-01 ENCOUNTER — Emergency Department: Payer: Medicare Other

## 2021-08-01 ENCOUNTER — Other Ambulatory Visit: Payer: Self-pay

## 2021-08-01 ENCOUNTER — Encounter: Payer: Self-pay | Admitting: Emergency Medicine

## 2021-08-01 DIAGNOSIS — E119 Type 2 diabetes mellitus without complications: Secondary | ICD-10-CM | POA: Insufficient documentation

## 2021-08-01 DIAGNOSIS — Z7982 Long term (current) use of aspirin: Secondary | ICD-10-CM | POA: Diagnosis not present

## 2021-08-01 DIAGNOSIS — Z87891 Personal history of nicotine dependence: Secondary | ICD-10-CM | POA: Insufficient documentation

## 2021-08-01 DIAGNOSIS — S0081XA Abrasion of other part of head, initial encounter: Secondary | ICD-10-CM | POA: Diagnosis not present

## 2021-08-01 DIAGNOSIS — Z23 Encounter for immunization: Secondary | ICD-10-CM | POA: Insufficient documentation

## 2021-08-01 DIAGNOSIS — W01198A Fall on same level from slipping, tripping and stumbling with subsequent striking against other object, initial encounter: Secondary | ICD-10-CM | POA: Insufficient documentation

## 2021-08-01 DIAGNOSIS — S0990XA Unspecified injury of head, initial encounter: Secondary | ICD-10-CM | POA: Insufficient documentation

## 2021-08-01 DIAGNOSIS — Z7951 Long term (current) use of inhaled steroids: Secondary | ICD-10-CM | POA: Diagnosis not present

## 2021-08-01 DIAGNOSIS — Z79899 Other long term (current) drug therapy: Secondary | ICD-10-CM | POA: Diagnosis not present

## 2021-08-01 DIAGNOSIS — I119 Hypertensive heart disease without heart failure: Secondary | ICD-10-CM | POA: Diagnosis not present

## 2021-08-01 DIAGNOSIS — I2510 Atherosclerotic heart disease of native coronary artery without angina pectoris: Secondary | ICD-10-CM | POA: Diagnosis not present

## 2021-08-01 DIAGNOSIS — Z96653 Presence of artificial knee joint, bilateral: Secondary | ICD-10-CM | POA: Diagnosis not present

## 2021-08-01 MED ORDER — TETANUS-DIPHTH-ACELL PERTUSSIS 5-2.5-18.5 LF-MCG/0.5 IM SUSY
0.5000 mL | PREFILLED_SYRINGE | Freq: Once | INTRAMUSCULAR | Status: AC
  Administered 2021-08-01: 0.5 mL via INTRAMUSCULAR
  Filled 2021-08-01: qty 0.5

## 2021-08-01 MED ORDER — ACETAMINOPHEN 325 MG PO TABS
650.0000 mg | ORAL_TABLET | Freq: Once | ORAL | Status: AC
  Administered 2021-08-01: 650 mg via ORAL
  Filled 2021-08-01: qty 2

## 2021-08-01 NOTE — ED Provider Notes (Signed)
[facility] Emergency Department Provider Note ____________________________________________   Event Date/Time   First MD Initiated Contact with Patient 08/01/21 1352     (approximate)  I have reviewed the triage vital signs and the nursing notes.   HISTORY  Chief Complaint Fall    HPI Brandy Oneill is a 70 y.o. female with PMH as noted below who presents with a head injury, acute onset when she tripped on an orthopedic shoe she was wearing and fell forwards, hitting her face on a tree stump.  The patient did not lose consciousness.  She denies other injuries.  She denies any significant neck or back pain.  She states that while she has been in the ED she got a shooting pain from her left ankle to her knee when she went to go to the bathroom but she has been able to bear weight on the left leg without difficulty.  She is wearing the orthopedic shoe on that leg due to a tendon injury.  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   CAD (coronary artery disease) 04/28/2020   S/P angioplasty with stent 04/28/2020   Nonrheumatic aortic valve stenosis 03/12/2020   Essential hypertension 02/06/2019   Hyperlipidemia 02/06/2019   Spinal stenosis of lumbar region with radiculopathy 09/27/2018   BMI 38.0-38.9,adult 09/26/2018   Back pain 09/05/2018   Diet-controlled type 2 diabetes mellitus ([place]) 03/27/2018   Postmenopausal 03/27/2018   DJD  (degenerative joint disease) 02/04/2018   History of angioedema 09/17/2017   Hyperglycemia 09/17/2017   Lymphedema 02/04/2017   Chronic venous insufficiency 02/04/2017   Swelling of limb 02/04/2017   Pure hypercholesterolemia 10/02/2016   History of total knee arthroplasty 09/20/2016   Varicose veins of both legs with edema 03/25/2016   Abnormal mammogram 06/01/2015   Urinary incontinence in female 06/01/2015   Prediabetes 06/16/2014   Anxiety and depression 01/28/2014   Psoriasis 01/28/2014    Past Surgical History:  Procedure Laterality Date   bladder stimulater  2014   CATARACT EXTRACTION W/ INTRAOCULAR LENS IMPLANT Bilateral 2014   one eye done then the other eye done a month later   [place] N/A 06/26/2018   Procedure: COLONOSCOPY WITH PROPOFOL;  Surgeon: Toledo, Benay Pike, MD;  Location: [facility] ENDOSCOPY;  Service: Gastroenterology;  Laterality: N/A;   EYE SURGERY Right 2018   macular hole   JOINT REPLACEMENT Right 10/27/2016   JOINT REPLACEMENT Left 2014   KNEE CLOSED REDUCTION Right 11/29/2016   Procedure: CLOSED MANIPULATION KNEE;  Surgeon: Leanor Kail, MD;  Location: [facility] ORS;  Service: Orthopedics;  Laterality: Right;    Prior to Admission medications   Medication Sig Start Date End Date Taking? Authorizing Provider  albuterol (PROVENTIL HFA;VENTOLIN HFA) 108 (90 Base) MCG/ACT inhaler Inhale into the lungs every 6 (six) hours as needed for wheezing or  shortness of breath.    [provider]  amLODipine (NORVASC) 2.5 MG tablet Take 2.5 mg by mouth daily.    [provider]  aspirin 81 MG chewable tablet Take 4 tablets today, Sept 7, 2021 and then one each morning thereafter 04/27/20   [provider]  azithromycin (ZITHROMAX Z-PAK) 250 MG tablet Take 2 tablets (500 mg) on  Day 1,  followed by 1 tablet (250 mg) once daily on Days 2 through 5. 12/10/20   Duffy Bruce, MD  clobetasol ointment (TEMOVATE) 0.05  % Apply topically 2 (two) times daily. 03/31/21 03/31/22  [provider]  clopidogrel (PLAVIX) 75 MG tablet Take by mouth.    [provider]  EPINEPHrine (EPIPEN IJ) Inject as directed.    [provider]  fluticasone (FLONASE) 50 MCG/ACT nasal spray SHAKE LQ AND U 1 SPR IEN QD 12/20/17   [provider]  lisinopril (PRINIVIL,ZESTRIL) 40 MG tablet Take 40 mg by mouth daily. 10/26/16   [provider]  meloxicam (MOBIC) 15 MG tablet Take 1 tablet (15 mg total) by mouth daily. 07/19/21   Edrick Kins, DPM  metoprolol succinate (TOPROL-XL) 25 MG 24 hr tablet Take 25 mg by mouth daily. 10/02/16   [provider]  Multiple Vitamin (MULTIVITAMIN WITH MINERALS) TABS tablet Take 1 tablet by mouth daily. [facility]rum [facility]    [provider]  oxybutynin (DITROPAN) 5 MG tablet Take 1 tablet (5 mg total) by mouth 2 (two) times daily. 04/14/19   Bjorn Loser, MD  pravastatin (PRAVACHOL) 10 MG tablet Take 10 mg by mouth every evening. 10/02/16   [provider]  venlafaxine (EFFEXOR) 37.5 MG tablet Take 37.5 mg by mouth daily. 08/29/16   [provider]    Allergies Kiwi extract and Sulfa antibiotics  Family History  Problem Relation Age of Onset   Breast cancer Neg Hx     Social History Social History   Tobacco Use   Smoking status: Former    Types: Cigarettes    Quit date: 11/27/2012    Years since quitting: 8.6   Smokeless tobacco: Never  Substance Use Topics   Drug use: No    Review of Systems  Constitutional: No fever/chills Eyes: No visual changes. ENT: No neck pain. Cardiovascular: Denies chest pain. Respiratory: Denies shortness of breath. Gastrointestinal: No vomiting or diarrhea.  Genitourinary: Negative for dysuria.  Musculoskeletal: Negative for back pain. Skin: Negative for rash. Neurological: Positive for headache.   ____________________________________________   PHYSICAL EXAM:  VITAL  SIGNS: ED Triage Vitals  Enc Vitals Group     BP 08/01/21 1213 (!) 150/86     Pulse Rate 08/01/21 1213 75     Resp 08/01/21 1213 17     Temp 08/01/21 1213 98 F (36.7 C)     Temp Source 08/01/21 1213 Oral     SpO2 08/01/21 1213 96 %     Weight 08/01/21 1155 227 lb 15.3 oz (103.4 kg)     Height 08/01/21 1155 5\' 3"  (1.6 m)     Head Circumference --      Peak Flow --      Pain Score 08/01/21 1154 7     Pain Loc --      Pain Edu? --      Excl. in [place]? --     Constitutional: Alert and oriented. Well appearing and in no acute distress. Eyes: Conjunctivae are normal.  Head: Superficial abrasion to the left forehead and left  maxillary area. Nose: No congestion/rhinnorhea. Mouth/Throat: Mucous membranes are moist.   Neck: Normal range of motion.  No midline cervical spinal tenderness. Cardiovascular: Normal rate, regular rhythm. Good peripheral circulation. Respiratory: Normal respiratory effort.  No retractions. Lungs CTAB. Gastrointestinal: Soft and nontender. No distention.  Genitourinary: No CVA tenderness. Musculoskeletal: No midline spinal tenderness.  No lower extremity edema.  Extremities warm and well perfused.  Full range of motion to left knee and ankle with no tenderness or deformity. Neurologic:  Normal speech and language.  5/5 motor strength and intact sensation to all 4 extremities.  Normal coordination with no ataxia.  No pronator drift.  No facial droop. Skin:  Skin is warm and dry. No rash noted. Psychiatric: Mood and affect are normal. Speech and behavior are normal.  ____________________________________________   LABS (all labs ordered are listed, but only abnormal results are displayed)  Labs Reviewed - No data to display ____________________________________________  EKG   ____________________________________________  RADIOLOGY  CT/cervical spine: IMPRESSION:  Left frontal scalp hematoma. No acute intracranial abnormality seen.     Severe multilevel  degenerative disc disease is noted cervical spine.  No acute abnormality is noted.    ____________________________________________   PROCEDURES  Procedure(s) performed: No  Procedures  Critical Care performed: No ____________________________________________   INITIAL IMPRESSION / ASSESSMENT AND PLAN / ED COURSE  Pertinent labs & imaging results that were available during my care of the patient were reviewed by me and considered in my medical decision making (see chart for details).   70 year old female with PMH as noted above, on aspirin but no anticoagulation presents with a head injury when she had a mechanical fall from standing height, hitting her face on a tree stump.  On exam the patient is hypertensive with otherwise normal vital signs.  Neurologic exam is normal.  There is no midline spinal tenderness.  She developed some pain in her left leg while in the ED but has no evidence of significant injury there and likely pulled her calf muscle.  CT head and cervical spine obtained from triage are negative.  There is no indication for additional ED work-up.  The patient is stable for discharge home.  Tetanus shot was updated.  I counseled her on the results of the imaging, follow-up, and return precautions, with specific precautions related to the risk of delayed rebleeding on aspirin.  She expressed understanding.  ____________________________________________   FINAL CLINICAL IMPRESSION(S) / ED DIAGNOSES  Final diagnoses:  Minor head injury, initial encounter  Abrasion of face, initial encounter      NEW MEDICATIONS STARTED DURING THIS VISIT:  Discharge Medication List as of 08/01/2021  2:21 PM       Note:  This document was prepared using Dragon voice recognition software and may include unintentional dictation errors.    Arta Silence, MD 08/01/21 1500

## 2021-08-01 NOTE — ED Notes (Signed)
Pt. To CT

## 2021-08-01 NOTE — Discharge Instructions (Signed)
Aspirin can lead to an increased risk of bleeding in the brain within the 24 hours after a head injury.  Return to the ER immediately for new, worsening, or persistent severe headache, nausea or vomiting, vision changes, weakness or numbness, strokelike symptoms, or any other new or worsening symptoms that concern you.

## 2021-08-01 NOTE — ED Triage Notes (Signed)
Fall.  Lost balance in yard, hit head on tree stump. No LOC.  Swelling to right forehead. Redness to left eye.  AAOx3.  Skin warm and dry. NAD

## 2021-08-09 ENCOUNTER — Ambulatory Visit (INDEPENDENT_AMBULATORY_CARE_PROVIDER_SITE_OTHER): Payer: Medicare Other | Admitting: Podiatry

## 2021-08-09 ENCOUNTER — Other Ambulatory Visit: Payer: Self-pay

## 2021-08-09 DIAGNOSIS — M7672 Peroneal tendinitis, left leg: Secondary | ICD-10-CM

## 2021-08-09 NOTE — Progress Notes (Signed)
° °  HPI: 70 y.o. female presenting today for follow-up evaluation of insertional peroneal tendinitis to the lateral aspect of the left foot.  Patient states that she is doing much better and has no pain associated to the area.  She says the injection and anti-inflammatory helped significantly.  She is no longer wearing the cam boot.  No new complaints at this time  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   History of kidney stones    Hyperlipidemia    Hypertension    PONV (postoperative nausea and vomiting) 2001   kidney stone   Pre-diabetes    Psoriasis    Tubular adenoma of colon     Past Surgical History:  Procedure Laterality Date   bladder stimulater  2014   CATARACT EXTRACTION W/ INTRAOCULAR LENS IMPLANT Bilateral 2014   one eye done then the other eye done a month later   [place] PROPOFOL N/A 06/26/2018   Procedure: COLONOSCOPY WITH PROPOFOL;  Surgeon: Toledo, Benay Pike, MD;  Location: [facility] ENDOSCOPY;  Service: Gastroenterology;  Laterality: N/A;   EYE SURGERY Right 2018   macular hole   JOINT REPLACEMENT Right 10/27/2016   JOINT REPLACEMENT Left 2014   KNEE CLOSED REDUCTION Right 11/29/2016   Procedure: CLOSED MANIPULATION KNEE;  Surgeon: Leanor Kail, MD;  Location: [facility] ORS;  Service: Orthopedics;  Laterality: Right;    Allergies  Allergen Reactions   Kiwi Extract    Sulfa Antibiotics Other (See Comments)     Physical Exam: General: The patient is alert and oriented x3 in no acute distress.  Dermatology: Skin is warm, dry and supple bilateral lower extremities. Negative for open lesions or macerations.  Vascular: Palpable pedal pulses bilaterally. Capillary refill within normal limits.  Negative for any significant  edema or erythema  Neurological: Light touch and protective threshold grossly intact  Musculoskeletal Exam: No pedal deformities noted.  Negative for any tenderness to palpation along the fifth metatarsal tubercle.  The insertional peroneal tendinitis appears to be resolved  Assessment: 1.  Insertional peroneal tendinitis left; resolved   Plan of Care:  1. Patient evaluated.  Overall the patient is doing much better.  She states that she no longer has any pain associated to the foot.  She may resume full activity no restrictions 2.  Patient may completely discontinue the cam boot.  Recommend good supportive shoes and sneakers 3.  Continue meloxicam as needed 4.  Return to clinic as needed      Edrick Kins, DPM [facility]  Dr. Edrick Kins, DPM    [address], [place] [address]                Office [phone]  Fax [phone]

## 2021-10-21 ENCOUNTER — Other Ambulatory Visit: Payer: Self-pay | Admitting: Family Medicine

## 2021-10-21 DIAGNOSIS — M4807 Spinal stenosis, lumbosacral region: Secondary | ICD-10-CM

## 2021-10-24 ENCOUNTER — Other Ambulatory Visit: Payer: Self-pay

## 2021-10-24 ENCOUNTER — Ambulatory Visit
Admission: RE | Admit: 2021-10-24 | Discharge: 2021-10-24 | Disposition: A | Discharge status: Home / Self Care | Payer: Medicare Other | Source: Ambulatory Visit | Attending: Family Medicine | Admitting: Family Medicine

## 2021-10-24 DIAGNOSIS — M4807 Spinal stenosis, lumbosacral region: Secondary | ICD-10-CM | POA: Diagnosis not present

## 2021-12-16 ENCOUNTER — Other Ambulatory Visit: Payer: Self-pay | Admitting: Neurosurgery

## 2021-12-16 DIAGNOSIS — M48062 Spinal stenosis, lumbar region with neurogenic claudication: Secondary | ICD-10-CM

## 2021-12-16 DIAGNOSIS — M4310 Spondylolisthesis, site unspecified: Secondary | ICD-10-CM

## 2021-12-23 ENCOUNTER — Other Ambulatory Visit: Payer: Self-pay | Admitting: Internal Medicine

## 2021-12-23 DIAGNOSIS — N63 Unspecified lump in unspecified breast: Secondary | ICD-10-CM

## 2021-12-30 ENCOUNTER — Ambulatory Visit
Admission: RE | Admit: 2021-12-30 | Discharge: 2021-12-30 | Disposition: A | Discharge status: Home / Self Care | Payer: Medicare Other | Source: Ambulatory Visit | Attending: Neurosurgery | Admitting: Neurosurgery

## 2021-12-30 ENCOUNTER — Ambulatory Visit: Admission: RE | Admit: 2021-12-30 | Payer: Medicare Other | Source: Ambulatory Visit

## 2021-12-30 ENCOUNTER — Ambulatory Visit: Payer: Medicare Other

## 2021-12-30 DIAGNOSIS — M4310 Spondylolisthesis, site unspecified: Secondary | ICD-10-CM | POA: Insufficient documentation

## 2021-12-30 DIAGNOSIS — M48062 Spinal stenosis, lumbar region with neurogenic claudication: Secondary | ICD-10-CM | POA: Insufficient documentation

## 2021-12-30 MED ORDER — IOHEXOL 180 MG/ML  SOLN
20.0000 mL | Freq: Once | INTRAMUSCULAR | Status: AC | PRN
  Administered 2021-12-30: 20 mL

## 2021-12-30 MED ORDER — LIDOCAINE HCL (PF) 1 % IJ SOLN
10.0000 mL | Freq: Once | INTRAMUSCULAR | Status: AC
  Administered 2021-12-30: 10 mL
  Filled 2021-12-30: qty 10

## 2022-01-10 ENCOUNTER — Inpatient Hospital Stay: Admission: RE | Admit: 2022-01-10 | Payer: Medicare Other | Source: Ambulatory Visit

## 2022-01-10 ENCOUNTER — Other Ambulatory Visit: Payer: Medicare Other

## 2022-01-17 ENCOUNTER — Other Ambulatory Visit: Payer: Self-pay | Admitting: Neurosurgery

## 2022-01-17 DIAGNOSIS — Z01818 Encounter for other preprocedural examination: Secondary | ICD-10-CM

## 2022-01-23 ENCOUNTER — Inpatient Hospital Stay
Admission: RE | Admit: 2022-01-23 | Discharge: 2022-01-23 | Disposition: A | Discharge status: Home / Self Care | Payer: Medicare Other | Source: Ambulatory Visit

## 2022-01-23 HISTORY — DX: Presence of prosthetic heart valve: Z95.2

## 2022-01-23 NOTE — Patient Instructions (Addendum)
Your procedure is scheduled on: Monday February 06, 2022. Report to Day Surgery inside [facility] 2nd floor, stop by admissions desk before getting on elevator.  To find out your arrival time please call [phone] between 1PM - 3PM on Friday February 03, 2022.  Remember: Instructions that are not followed completely may result in serious medical risk,  up to and including death, or upon the discretion of your surgeon and anesthesiologist your  surgery may need to be rescheduled.     _X__ 1. Do not eat food after midnight the night before your procedure.                 No chewing gum or hard candies. You may drink clear liquids up to 2 hours                 before you are scheduled to arrive for your surgery- DO not drink clear                 liquids within 2 hours of the start of your surgery.                 Clear Liquids include:  water, apple juice without pulp, clear Gatorade, G2 or                  Gatorade Zero (avoid Red/Purple/Blue), Black Coffee or Tea (Do not add                 anything to coffee or tea).  __X__2.  On the morning of surgery brush your teeth with toothpaste and water, you                may rinse your mouth with mouthwash if you wish.  Do not swallow any toothpaste or mouthwash.     _X__ 3.  No Alcohol for 24 hours before or after surgery.   _X__ 4.  Do Not Smoke or use e-cigarettes For 24 Hours Prior to Your Surgery.                 Do not use any chewable tobacco products for at least 6 hours prior to                 Surgery.  _X__  5.  Do not use any recreational drugs (marijuana, cocaine, heroin, ecstasy, MDMA or other)                For at least one week prior to your surgery.  Combination of these drugs with anesthesia                May have life threatening results.  ____  6.  Bring all medications with you on the day of surgery if instructed.   __X__  7.  Notify your doctor if there is any change in your medical condition       (cold, fever, infections).     Do not wear jewelry, make-up, hairpins, clips or nail polish. Do not wear lotions, powders, or perfumes. You may wear deodorant. Do not shave 48 hours prior to surgery. Men may shave face and neck. Do not bring valuables to the hospital.    [facility] is not responsible for any belongings or valuables.  Contacts, dentures or bridgework may not be worn into surgery. Leave your suitcase in the car. After surgery it may be brought to your room. For patients admitted to the hospital, discharge time is  determined by your treatment team.   Patients discharged the day of surgery will not be allowed to drive home.   Make arrangements for someone to be with you for the first 24 hours of your Same Day Discharge.    Please read over the following fact sheets that you were given:   Spine Surgery Patient Guide    __X__ Take these medicines the morning of surgery with A SIP OF WATER:    1. amLODipine (NORVASC) 2.5 MG   2. gabapentin (NEURONTIN) 300 MG  3. metoprolol succinate (TOPROL-XL) 25 MG  4. oxybutynin (DITROPAN) 5 MG  5.  6.  ____ Fleet Enema (as directed)   __X__ Use CHG Soap (or wipes) as directed  ____ Use Benzoyl Peroxide Gel as instructed  __X__ Use inhalers on the day of surgery  albuterol (PROVENTIL HFA;VENTOLIN HFA) 108 (90 Base) MCG/ACT inhaler  fluticasone (FLONASE) 50 MCG/ACT nasal   ____ Stop metformin 2 days prior to surgery    ____ Take 1/2 of usual insulin dose the night before surgery. No insulin the morning          of surgery.   __X__ Stop clopidogrel (PLAVIX) 75 MG as instructed by your doctor. y.   __X__ One Week prior to surgery- Stop Anti-inflammatories such as Ibuprofen, Aleve, Advil, Motrin, meloxicam (MOBIC), diclofenac, etodolac, ketorolac, Toradol, Daypro, piroxicam, Goody's or BC powders. OK TO USE TYLENOL IF NEEDED   __X__ Stop supplements until after surgery.    ____ Bring C-Pap to the hospital.    If you  have any questions regarding your pre-procedure instructions,  Please call Pre-admit Testing at [phone]

## 2022-01-25 ENCOUNTER — Ambulatory Visit
Admission: RE | Admit: 2022-01-25 | Discharge: 2022-01-25 | Disposition: A | Discharge status: Home / Self Care | Payer: Medicare Other | Source: Ambulatory Visit | Attending: Internal Medicine | Admitting: Internal Medicine

## 2022-01-25 DIAGNOSIS — N63 Unspecified lump in unspecified breast: Secondary | ICD-10-CM

## 2022-01-25 DIAGNOSIS — N6310 Unspecified lump in the right breast, unspecified quadrant: Secondary | ICD-10-CM | POA: Diagnosis not present

## 2022-01-25 DIAGNOSIS — Z1231 Encounter for screening mammogram for malignant neoplasm of breast: Secondary | ICD-10-CM | POA: Diagnosis not present

## 2022-01-25 DIAGNOSIS — N6001 Solitary cyst of right breast: Secondary | ICD-10-CM | POA: Insufficient documentation

## 2022-01-27 ENCOUNTER — Other Ambulatory Visit: Payer: Self-pay

## 2022-01-27 ENCOUNTER — Encounter
Admission: RE | Admit: 2022-01-27 | Discharge: 2022-01-27 | Disposition: A | Discharge status: Home / Self Care | Payer: Medicare Other | Source: Ambulatory Visit | Attending: Neurosurgery | Admitting: Neurosurgery

## 2022-01-27 VITALS — BP 140/79 | HR 77 | Temp 98.1°F | Resp 18 | Ht 63.5 in | Wt 221.6 lb

## 2022-01-27 DIAGNOSIS — Z96653 Presence of artificial knee joint, bilateral: Secondary | ICD-10-CM | POA: Insufficient documentation

## 2022-01-27 DIAGNOSIS — Z01810 Encounter for preprocedural cardiovascular examination: Secondary | ICD-10-CM

## 2022-01-27 DIAGNOSIS — Z01818 Encounter for other preprocedural examination: Secondary | ICD-10-CM | POA: Diagnosis present

## 2022-01-27 DIAGNOSIS — I10 Essential (primary) hypertension: Secondary | ICD-10-CM | POA: Insufficient documentation

## 2022-01-27 LAB — URINALYSIS, ROUTINE W REFLEX MICROSCOPIC
Bilirubin Urine: NEGATIVE
Glucose, UA: NEGATIVE mg/dL
Hgb urine dipstick: NEGATIVE
Ketones, ur: NEGATIVE mg/dL
Leukocytes,Ua: NEGATIVE
Nitrite: NEGATIVE
Protein, ur: NEGATIVE mg/dL
Specific Gravity, Urine: 1.014 (ref 1.005–1.030)
pH: 5 (ref 5.0–8.0)

## 2022-01-27 LAB — TYPE AND SCREEN
ABO/RH(D): A POS
Antibody Screen: NEGATIVE

## 2022-01-27 LAB — SURGICAL PCR SCREEN
MRSA, PCR: POSITIVE — AB
Staphylococcus aureus: POSITIVE — AB

## 2022-01-27 NOTE — Patient Instructions (Addendum)
Your procedure is scheduled on: 02/06/2022  Report to the Registration Desk on the 1st floor of the [facility]. To find out your arrival time, please call [phone] between 1PM - 3PM on: 02/03/2022  If your arrival time is 6:00 am, do not arrive prior to that time as the [facility] entrance doors do not open until 6:00 am.  REMEMBER: Instructions that are not followed completely may result in serious medical risk, up to and including death; or upon the discretion of your surgeon and anesthesiologist your surgery may need to be rescheduled.  Do not eat food after midnight the night before surgery.  No gum chewing, lozengers or hard candies.  You may however, drink CLEAR liquids up to 2 hours before you are scheduled to arrive for your surgery. Do not drink anything within 2 hours of your scheduled arrival time.  Clear liquids include: - water  - apple juice without pulp - gatorade (not RED colors) - Do NOT drink anything that is not on this list.     TAKE THESE MEDICATIONS THE MORNING OF SURGERY WITH A SIP OF WATER: Ditropan Gabapentin.    Do not take metformin on 6/17,6/18/,6/19.   Use inhalers on the day of surgery and bring to the hospital.   Follow recommendations from Cardiologist, Pulmonologist or PCP regarding stopping Aspirin. Please call Surgeon's office about aspirin for instructions.   One week prior to surgery: Stop Anti-inflammatories (NSAIDS) such as Advil, Aleve, Ibuprofen, Motrin, Naproxen, Naprosyn and Aspirin based products such as Excedrin, Goodys Powder, BC Powder. Stop ANY OVER THE COUNTER supplements until after surgery like multivitamins.  You may however, continue to take Tylenol if needed for pain up until the day of surgery.  No Alcohol for 24 hours before or after surgery.  No Smoking including e-cigarettes for 24 hours prior to surgery to ensure clear lungs  No chewable tobacco products for at least 6 hours prior to surgery.  No nicotine  patches on the day of surgery.  Do not use any "recreational" drugs for at least a week prior to your surgery.  Please be advised that the combination of cocaine and anesthesia may have negative outcomes, up to and including death. If you test positive for cocaine, your surgery will be cancelled.  On the morning of surgery brush your teeth with toothpaste and water, you may rinse your mouth with mouthwash if you wish. Do not swallow any toothpaste or mouthwash.  Use CHG Soap as directed on instruction sheet.  Do not wear jewelry, make-up, hairpins, clips or nail polish.  Do not wear lotions, powders, or perfumes.   Do not shave body from the neck down 48 hours prior to surgery just in case you cut yourself which could leave a site for infection.  Also, freshly shaved skin may become irritated if using the CHG soap.  Contact lenses, hearing aids and dentures may not be worn into surgery.  Do not bring valuables to the hospital. [facility] is not responsible for any missing/lost belongings or valuables.   Notify your doctor if there is any change in your medical condition (cold, fever, infection).  Wear comfortable clothing (specific to your surgery type) to the hospital.  After surgery, you can help prevent lung complications by doing breathing exercises.  Take deep breaths and cough every 1-2 hours. Your doctor may order a device called an Incentive Spirometer to help you take deep breaths.  If you are being admitted to the hospital overnight, leave  your suitcase in the car. After surgery it may be brought to your room.  If you are being discharged the day of surgery, you will not be allowed to drive home. You will need a responsible adult (18 years or older) to drive you home and stay with you that night.   If you are taking public transportation, you will need to have a responsible adult (18 years or older) with you. Please confirm with your physician that it is acceptable to  use public transportation.   Please call the Welcome Dept. at [phone] if you have any questions about these instructions.  Surgery Visitation Policy:  Patients undergoing a surgery or procedure may have two family members or support persons with them as long as the person is not COVID-19 positive or experiencing its symptoms.   Inpatient Visitation:    Visiting hours are 7 a.m. to 8 p.m. Up to four visitors are allowed at one time in a patient room, including children. The visitors may rotate out with other people during the day. One designated support person (adult) may remain overnight.

## 2022-01-27 NOTE — Progress Notes (Signed)
  Perioperative Services  Abnormal Lab Notification and Treatment Plan of Care  Date: 01/27/22  Name: Brandy Oneill MRN:   [id number]  Re: Abnormal labs noted during PAT appointment  Provider Notified: Meade Maw, MD Notification mode: Routed and/or faxed via [place] of concern: Lab Results  Component Value Date   STAPHAUREUS POSITIVE (A) 01/27/2022   MRSAPCR POSITIVE (A) 01/27/2022    Notes: Patient is scheduled for a L3-5 DECOMPRESSION on 02/06/2022. She is scheduled to receive CEFAZOLIN pre-operatively. Surgical PCR (+) for MRSA; see above.  PLANS:  Review renal function. BUN 17 and creatinine 1.0 mg/dL (eGFR 55) Review allergies. No documented allergy to vancomycin. Order added for VANCOMYCIN 1 GRAM IV to current preoperative prophylactic regimen.  Patient with orders for both CEFAZOLIN + VANCOMYCIN to be given in the setting of documented MRSA (+) surgical PCR.   Guidelines suggest that a beta-lactam antibiotic (first or second generation cephalosporin) should be added for activity against gram-negative organisms.  Vancomycin appears to be less effective than cefazolin for preventing SSIs caused by MSSA. For this reason, the use of vancomycin in combination with cefazolin is favored for prevention of SSI due to MRSA and coagulase-negative staphylococci.  Notify primary attending surgeon of (+) MRSA result and that order has been placed for the Vancomycin by perioperative APP.   This is a Community education officer; no formal response is required.  Honor Loh, MSN, APRN, FNP-C, CEN [facility]  Peri-operative Services Nurse Practitioner Phone: [phone] 01/27/22 2:38 PM

## 2022-02-02 ENCOUNTER — Encounter: Payer: Self-pay | Admitting: Neurosurgery

## 2022-02-02 NOTE — Progress Notes (Signed)
Perioperative Services  Pre-Admission/Anesthesia Testing Clinical Review  Date: 02/03/22  Patient Demographics:  Name: Brandy Oneill DOB:   20-Jan-1951 MRN:   [id number]  Planned Surgical Procedure(s):    Case: [id number] Date/Time: 02/06/22 1300   Procedure: L3-5 DECOMPRESSION   Anesthesia type: General   Pre-op diagnosis: Neurogenic claudication due to lumbar spinal stenosis M48.062   Location: [facility] OR ROOM 03 / [facility]   Surgeons: Meade Maw, MD   NOTE: Available PAT nursing documentation and vital signs have been reviewed. Clinical nursing staff has updated patient's PMH/PSHx, current medication list, and drug allergies/intolerances to ensure comprehensive history available to assist in medical decision making as it pertains to the aforementioned surgical procedure and anticipated anesthetic course. Extensive review of available clinical information performed. [place] PMH and PSHx updated with any diagnoses/procedures that  may have been inadvertently omitted during her intake with the pre-admission testing department's nursing staff.  Clinical Discussion:  Brandy Oneill is a 71 y.o. female who is submitted for pre-surgical anesthesia review and clearance prior to her undergoing the above procedure. Patient is a Current Smoker (quit 11/2012). Pertinent PMH includes: CAD, severe aortic valve stenosis (s/p TAVR), diastolic dysfunction, HTN, HLD, T2DM, lumbar spinal stenosis, lymphedema, OA, anxiety, depression.  Patient is followed by cardiology Edwin Dada, MD). She was last seen in the cardiology clinic on 06/16/2021; notes reviewed.  At the time of her clinic visit, patient doing well overall from a cardiovascular perspective.  She denied any episodes of chest pain, shortness of breath, PND, orthopnea, palpitations, significant peripheral edema, vertiginous symptoms, or presyncope/syncope.  Patient with past medical history significant for cardiovascular  diagnoses.  Patient found to have moderate nonrheumatic aortic valve stenosis on TTE performed 01/06/2019.  EF >55%.  Mean transvalvular gradient was 34.1 mmHg.    Subsequent echocardiogram performed on 01/26/2020 revealed progression of the valvular stenosis with a mean pressure gradient of 47.1 mmHg.    Diagnostic right heart catheterization performed on 04/14/2020 revealing normal cardiac index, PVR, and right atrial pressures.  PCWP 10 mmHg.  There was moderate CAD with severe obstructive disease and a large dominant RCA.  Staged PCI was performed.  Patient underwent PCI on 04/28/2020 with a DES x1 (unknown type) being placed to the distal RCA with excellent angiographic result and TIMI-3 flow.    Given the progression of her valvular disease, patient underwent TAVR on 05/11/2020; 23 mm Edwards Sapien S3 bioprosthetic valve placed.  Post TAVR echocardiogram revealed a well-seated and functioning bioprosthetic valve with a mean pressure gradient of 9 mmHg.  Most recent TTE was performed on 06/16/2021 revealing a normal left ventricular systolic function with an EF of >55%.  There was mild LVH.  GLS -11.6%.  Mean pressure gradient across bioprosthetic aortic valve had increased to 24 mmHg.  Of note, TTE was reviewed by CVTS who felt that the contrast that was utilized for the procedure made the gradient appear higher than it really was not.  Provider noted that from a valvular standpoint, patient "very stable".  Blood pressure reasonably controlled at 144/73 on currently prescribed CCB and ACEi therapies.  Patient is on a statin for her HLD and further ASCVD prevention.  T2DM reasonably on currently prescribed regimen; last HgbA1c was 7.2% when checked on 12/21/2021.  Functional capacity limited by spinal stenosis and arthritides.  With that being said, patient has completed cardiac rehab and found it to be beneficial.  Patient felt to be able to achieve at least 4 METS of  activity without  angina/anginal equivalent symptoms.  No changes were made to her medication regimen.  Patient to follow-up with outpatient cardiology in 1 year or sooner if needed.  Brandy Oneill is scheduled for an L3-5 DECOMPRESSION on 02/06/2022 with Dr. Meade Maw, MD.  Given patient's past medical history significant for cardiovascular diagnoses, presurgical cardiac clearance was sought by the PAT team. Per cardiology, "this patient is optimized for surgery and may proceed with the planned procedural course with a LOW risk of significant perioperative cardiovascular complications".  In review of her medication reconciliation, it is noted the patient is on daily antiplatelet therapy. She has been instructed on recommendations from her cardiologist regarding the need to continue her daily low dose ASA throughout her perioperative course.   Patient reports previous perioperative complications with anesthesia in the past. Patient has a PMH (+) for PONV associated with a urolithiasis procedure back in 2001; none since. Symptoms and history of PONV will be discussed with patient by anesthesia team on the day of her procedure. Interventions will be ordered as deemed necessary based on patient's individual care needs as determined by anesthesiologist. In review of the available records, it is noted that patient underwent a general anesthetic course at [facility] ([facility] IV) in 04/2020 without documented complications.      01/27/2022   10:00 AM 12/30/2021   12:00 PM 12/30/2021   11:00 AM  Vitals with BMI  Height 5' 3.5"    Weight 221 lbs 10 oz    BMI 17.61    Systolic 607 371 062  Diastolic 79 63 61  Pulse 77      Providers/Specialists:   NOTE: Primary physician provider listed below. Patient may have been seen by APP or partner within same practice.   PROVIDER ROLE / SPECIALTY LAST Dola Factor, MD Neurosurgery (Surgeon) 01/17/2022  Tracie Harrier, MD Primary Care Provider  12/21/2021  Cloretta Ned, MD Cardiology 06/16/2021  Sharlet Salina, MD Physiatry 09/26/2021   Allergies:  Kiwi extract and Sulfa antibiotics  Current Home Medications:   No current facility-administered medications for this encounter.    albuterol (PROVENTIL HFA;VENTOLIN HFA) 108 (90 Base) MCG/ACT inhaler   amLODipine (NORVASC) 2.5 MG tablet   aspirin 81 MG chewable tablet   clobetasol ointment (TEMOVATE) 0.05 %   EPINEPHrine (EPIPEN IJ)   fluticasone (FLONASE) 50 MCG/ACT nasal spray   gabapentin (NEURONTIN) 300 MG capsule   lisinopril (PRINIVIL,ZESTRIL) 40 MG tablet   meloxicam (MOBIC) 15 MG tablet   metFORMIN (GLUCOPHAGE) 500 MG tablet   Multiple Vitamin (MULTIVITAMIN WITH MINERALS) TABS tablet   oxybutynin (DITROPAN) 5 MG tablet   pravastatin (PRAVACHOL) 10 MG tablet   venlafaxine (EFFEXOR) 37.5 MG tablet   History:   Past Medical History:  Diagnosis Date   Anxiety    Arthritis    CAD (coronary artery disease) 04/14/2020   a.) RHC 04/14/2020: mod CAD with sev obstructive Dz in large dom RCA; staged PCI planned. b.) PCI 04/28/2020: DES (unknown type) placed to dRCA.   Depression    Diastolic dysfunction 69/48/5462   a.) TTE 01/06/2019: EF >55%; triv panvalvular regurgitation; G1DD. b.) TTE 01/26/2020: EF > 55%; mod LVH; triv MR/TR/PR; G1DD.   Diverticulosis 2016   External hemorrhoid    Family history of adverse reaction to anesthesia    a.) postoperative delirium in 1st degree relative (sister)   Hereditary lymphedema of legs 2017   History of angioedema    History of kidney stones  Hyperlipidemia    Hypertension    Lumbar spinal stenosis    Non-rheumatic aortic sclerosis 01/06/2019   a.) TTE 01/06/2019: EF >55%; mod AS (MPG 34.1 mmHg). b.) TTE 01/26/2020: EF >55%; sev AS (MPG 47.1 mmHg). c.) RHC 04/14/2020: norm CI, PVR, RAP; PCWP 10. d.) s/p TAVR 05/11/2020. e.) TTE 06/01/2020: EF >55%; mild LVH; MPG 9 mmHg. f.) TTE 06/16/2021: EF >55%; mild LVH; GLS  -11.6%; MPG 24 mmHg.   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   a.) 23 mm Edwards Sapien S3 bioprosthetic valve   T2DM (type 2 diabetes mellitus) ([place])    Tubular adenoma of colon    Varicose veins of both legs with edema    Past Surgical History:  Procedure Laterality Date   bladder stimulater  2014   CARDIAC VALVE REPLACEMENT     CATARACT EXTRACTION W/ INTRAOCULAR LENS IMPLANT Bilateral 2014   one eye done then the other eye done a month later   [place] N/A 06/26/2018   Procedure: COLONOSCOPY WITH PROPOFOL;  Surgeon: Toledo, Benay Pike, MD;  Location: [facility] ENDOSCOPY;  Service: Gastroenterology;  Laterality: N/A;   EYE SURGERY Right 2018   macular hole   JOINT REPLACEMENT Right 10/27/2016   JOINT REPLACEMENT Left 2014   KNEE CLOSED REDUCTION Right 11/29/2016   Procedure: CLOSED MANIPULATION KNEE;  Surgeon: Leanor Kail, MD;  Location: [facility] ORS;  Service: Orthopedics;  Laterality: Right;   Family History  Problem Relation Age of Onset   Breast cancer Neg Hx    Social History   Tobacco Use   Smoking status: Former    Types: Cigarettes    Quit date: 11/27/2012    Years since quitting: 9.1   Smokeless tobacco: Never  Substance Use Topics   Drug use: No    Pertinent Clinical Results:  LABS: Labs reviewed: Acceptable for surgery.  Component Date Value Ref Range Status   ABO/RH(D) 01/27/2022 A POS   Final   Antibody Screen 01/27/2022 NEG   Final   Sample Expiration 01/27/2022 02/10/2022,2359   Final   Extend sample reason 01/27/2022    Final                   Value:NO TRANSFUSIONS OR PREGNANCY IN THE PAST 3 MONTHS Performed at [facility], [address] [place], [place] [address]    Color, Urine 01/27/2022 YELLOW (A)  YELLOW Final   APPearance 01/27/2022 HAZY (A)  CLEAR Final   Specific  Gravity, Urine 01/27/2022 1.014  1.005 - 1.030 Final   pH 01/27/2022 5.0  5.0 - 8.0 Final   Glucose, UA 01/27/2022 NEGATIVE  NEGATIVE mg/dL Final   Hgb urine dipstick 01/27/2022 NEGATIVE  NEGATIVE Final   Bilirubin Urine 01/27/2022 NEGATIVE  NEGATIVE Final   Ketones, ur 01/27/2022 NEGATIVE  NEGATIVE mg/dL Final   Protein, ur 01/27/2022 NEGATIVE  NEGATIVE mg/dL Final   Nitrite 01/27/2022 NEGATIVE  NEGATIVE Final   Leukocytes,Ua 01/27/2022 NEGATIVE  NEGATIVE Final   Performed at [facility], [address] [place],  [address]   MRSA, PCR 01/27/2022 POSITIVE (A)  NEGATIVE Final   Comment: RESULT CALLED TO, READ BACK BY AND VERIFIED WITH: Brandy Oneill AT 1427 01/27/22.PMF    Staphylococcus aureus 01/27/2022 POSITIVE (A)  NEGATIVE Final   Comment: (NOTE) The Xpert SA Assay (FDA approved for  NASAL specimens in patients 14 years of age and older), is one component of a comprehensive surveillance program. It is not intended to diagnose infection nor to guide or monitor treatment. Performed at [facility], [address] [place], [place] [address]     ECG: Date: 01/27/2022 Time ECG obtained: 1151 AM Rate: 67 bpm Rhythm: normal sinus Axis (leads I and aVF): Normal Intervals: PR 196 ms. QRS 82 ms. QTc 431 ms. ST segment and T wave changes: No evidence of acute ST segment elevation or depression.  Evidence of an age undetermined anterior infarct present Comparison: Similar to previous tracing obtained on 12/10/2020   IMAGING / PROCEDURES: CT LUMBAR SPINE W CONTRAST; CT MYELOGRAM performed on 12/30/2021 Successful lumbar puncture at L5-S1 and intrathecal contrast injection for lumbar myelogram.  Notable interruption of contrast flow at L3-L4 and L4-L5 consistent with spinal canal stenosis.  Multilevel degenerative changes of lumbar spine, most significant at L4-L5, where there is grade 1 anterolisthesis, broad-based disc bulging and advanced bilateral facet  arthropathy resulting in severe spinal canal stenosis with effacement of the thecal sac.  Mild bilateral neural foraminal stenosis at L4-L5. Moderate-severe spinal canal stenosis with partial effacement of the thecal sac at L3-L4, and with mild to moderate left-sided neural foraminal stenosis at this level. Foraminal endplate spurring and facet spurring abuts the right exiting extraforaminal nerve root at L5-S1. Mild left neural foraminal stenosis at this level.  Mild spinal canal and left-sided neural foraminal narrowing at L2-L3.  CT CERVICAL SPINE WO CONTRAST performed on 08/01/2021 LEFT frontal scalp hematoma. No acute intracranial abnormality seen Severe multilevel degenerative disc disease is noted in the cervical spine; no acute abnormality noted.  TRANSTHORACIC ECHOCARDIOGRAM performed on 06/16/2021 Normal left ventricular systolic function with an EF of >55% Mild LVH GLS -11.6 Well-seated bioprosthetic aortic valve with a mean transvalvular gradient of 24 mmHg  LEFT HEART CATHETERIZATION AND PERCUTANEOUS CORONARY INTERVENTION performed on 04/28/2020 Single-vessel CAD Successful PCI with DES x1 placed to the distal RCA Continue DAPT therapy (ASA + clopidogrel) for >/= 1-year Schedule TAVR to treat AS  RIGHT HEART CATHETERIZATION performed on 04/14/2020 Normal cardiac index, PVR, RA pressure PCWP 10 mmHg Moderate CAD with severe obstructive disease in a large dominant RCA Calcified aortic valve leaflets with reduced leaflet mobility consistent with the echo Doppler diagnosis of aortic valve stenosis Review at the [place] multidisciplinary valve treatment conference this coming Friday for SAVR/CABG versus PCI/TAVR.  Brandy Oneill the latter clinically because of limited mobility, obesity, spine disease leading to surgical frailty.  Impression and Plan:  Aerilyn Slee has been referred for pre-anesthesia review and clearance prior to her undergoing the planned anesthetic and procedural  courses. Available labs, pertinent testing, and imaging results were personally reviewed by me. This patient has been appropriately cleared by cardiology with an overall LOW risk of significant perioperative cardiovascular complications.  Based on clinical review performed today (02/03/22), barring any significant acute changes in the patient's overall condition, it is anticipated that she will be able to proceed with the planned surgical intervention. Any acute changes in clinical condition may necessitate her procedure being postponed and/or cancelled. Patient will meet with anesthesia team (MD and/or CRNA) on the day of her procedure for preoperative evaluation/assessment. Questions regarding anesthetic course will be fielded at that time.   Pre-surgical instructions were reviewed with the patient during her PAT appointment and questions were fielded by PAT clinical staff. Patient was advised that if any questions or concerns arise prior to her procedure  then she should return a call to PAT and/or her surgeon's office to discuss.  Brandy Loh, MSN, APRN, FNP-C, CEN [facility]  Peri-operative Services Nurse Practitioner Phone: [phone] Fax: [phone] 02/03/22 12:28 PM  NOTE: This note has been prepared using Dragon dictation software. Despite my best ability to proofread, there is always the potential that unintentional transcriptional errors may still occur from this process.

## 2022-02-06 ENCOUNTER — Ambulatory Visit: Payer: Medicare Other

## 2022-02-06 ENCOUNTER — Other Ambulatory Visit: Payer: Self-pay

## 2022-02-06 ENCOUNTER — Encounter: Payer: Self-pay | Admitting: Neurosurgery

## 2022-02-06 ENCOUNTER — Ambulatory Visit: Payer: Medicare Other | Admitting: Urgent Care

## 2022-02-06 ENCOUNTER — Observation Stay
Admission: RE | Admit: 2022-02-06 | Discharge: 2022-02-07 | Disposition: A | Discharge status: Home Health | Payer: Medicare Other | Attending: Neurosurgery | Admitting: Neurosurgery

## 2022-02-06 ENCOUNTER — Encounter
Admission: RE | Disposition: A | Discharge status: Home Health | Payer: Self-pay | Source: Home / Self Care | Attending: Neurosurgery

## 2022-02-06 DIAGNOSIS — Z7982 Long term (current) use of aspirin: Secondary | ICD-10-CM | POA: Diagnosis not present

## 2022-02-06 DIAGNOSIS — I890 Lymphedema, not elsewhere classified: Secondary | ICD-10-CM | POA: Insufficient documentation

## 2022-02-06 DIAGNOSIS — Z87442 Personal history of urinary calculi: Secondary | ICD-10-CM | POA: Diagnosis not present

## 2022-02-06 DIAGNOSIS — R2681 Unsteadiness on feet: Secondary | ICD-10-CM | POA: Diagnosis not present

## 2022-02-06 DIAGNOSIS — I2510 Atherosclerotic heart disease of native coronary artery without angina pectoris: Secondary | ICD-10-CM | POA: Insufficient documentation

## 2022-02-06 DIAGNOSIS — K0889 Other specified disorders of teeth and supporting structures: Secondary | ICD-10-CM | POA: Insufficient documentation

## 2022-02-06 DIAGNOSIS — Z8679 Personal history of other diseases of the circulatory system: Secondary | ICD-10-CM | POA: Insufficient documentation

## 2022-02-06 DIAGNOSIS — F419 Anxiety disorder, unspecified: Secondary | ICD-10-CM | POA: Insufficient documentation

## 2022-02-06 DIAGNOSIS — Z96653 Presence of artificial knee joint, bilateral: Secondary | ICD-10-CM | POA: Insufficient documentation

## 2022-02-06 DIAGNOSIS — M48062 Spinal stenosis, lumbar region with neurogenic claudication: Secondary | ICD-10-CM | POA: Diagnosis present

## 2022-02-06 DIAGNOSIS — M6281 Muscle weakness (generalized): Secondary | ICD-10-CM | POA: Diagnosis not present

## 2022-02-06 DIAGNOSIS — Z8719 Personal history of other diseases of the digestive system: Secondary | ICD-10-CM | POA: Insufficient documentation

## 2022-02-06 DIAGNOSIS — R799 Abnormal finding of blood chemistry, unspecified: Secondary | ICD-10-CM | POA: Diagnosis not present

## 2022-02-06 DIAGNOSIS — F32A Depression, unspecified: Secondary | ICD-10-CM | POA: Insufficient documentation

## 2022-02-06 DIAGNOSIS — M1990 Unspecified osteoarthritis, unspecified site: Secondary | ICD-10-CM | POA: Insufficient documentation

## 2022-02-06 DIAGNOSIS — I10 Essential (primary) hypertension: Secondary | ICD-10-CM | POA: Diagnosis not present

## 2022-02-06 DIAGNOSIS — Z79899 Other long term (current) drug therapy: Secondary | ICD-10-CM | POA: Insufficient documentation

## 2022-02-06 DIAGNOSIS — M48061 Spinal stenosis, lumbar region without neurogenic claudication: Secondary | ICD-10-CM | POA: Diagnosis present

## 2022-02-06 DIAGNOSIS — E785 Hyperlipidemia, unspecified: Secondary | ICD-10-CM | POA: Diagnosis not present

## 2022-02-06 DIAGNOSIS — R200 Anesthesia of skin: Secondary | ICD-10-CM | POA: Diagnosis not present

## 2022-02-06 DIAGNOSIS — Z952 Presence of prosthetic heart valve: Secondary | ICD-10-CM | POA: Insufficient documentation

## 2022-02-06 DIAGNOSIS — Z87891 Personal history of nicotine dependence: Secondary | ICD-10-CM | POA: Insufficient documentation

## 2022-02-06 DIAGNOSIS — Z6838 Body mass index (BMI) 38.0-38.9, adult: Secondary | ICD-10-CM | POA: Insufficient documentation

## 2022-02-06 DIAGNOSIS — M79606 Pain in leg, unspecified: Secondary | ICD-10-CM | POA: Diagnosis not present

## 2022-02-06 DIAGNOSIS — Z7902 Long term (current) use of antithrombotics/antiplatelets: Secondary | ICD-10-CM | POA: Insufficient documentation

## 2022-02-06 DIAGNOSIS — Z7984 Long term (current) use of oral hypoglycemic drugs: Secondary | ICD-10-CM | POA: Diagnosis not present

## 2022-02-06 DIAGNOSIS — Z86010 Personal history of colonic polyps: Secondary | ICD-10-CM | POA: Insufficient documentation

## 2022-02-06 DIAGNOSIS — E119 Type 2 diabetes mellitus without complications: Secondary | ICD-10-CM | POA: Insufficient documentation

## 2022-02-06 HISTORY — PX: LUMBAR LAMINECTOMY/DECOMPRESSION MICRODISCECTOMY: SHX5026

## 2022-02-06 HISTORY — DX: Type 2 diabetes mellitus without complications: E11.9

## 2022-02-06 HISTORY — DX: Spinal stenosis, lumbar region without neurogenic claudication: M48.061

## 2022-02-06 HISTORY — DX: Personal history of other specified conditions: Z87.898

## 2022-02-06 HISTORY — DX: Varicose veins of bilateral lower extremities with other complications: I83.893

## 2022-02-06 LAB — GLUCOSE, CAPILLARY
Glucose-Capillary: 122 mg/dL — ABNORMAL HIGH (ref 70–99)
Glucose-Capillary: 117 mg/dL — ABNORMAL HIGH (ref 70–99)

## 2022-02-06 LAB — CREATININE, SERUM
Creatinine, Ser: 0.93 mg/dL (ref 0.44–1.00)
GFR, Estimated: >60 mL/min (ref >60)

## 2022-02-06 LAB — ABO/RH: ABO/RH(D): A POS

## 2022-02-06 SURGERY — LUMBAR LAMINECTOMY/DECOMPRESSION MICRODISCECTOMY 2 LEVELS
Anesthesia: General | Site: Back

## 2022-02-06 MED ORDER — SUGAMMADEX SODIUM 200 MG/2ML IV SOLN
INTRAVENOUS | Status: DC | PRN
  Administered 2022-02-06: 200 mg via INTRAVENOUS

## 2022-02-06 MED ORDER — SURGIFLO WITH THROMBIN (HEMOSTATIC MATRIX KIT) OPTIME
TOPICAL | Status: DC | PRN
  Administered 2022-02-06: 1 via TOPICAL

## 2022-02-06 MED ORDER — ROCURONIUM BROMIDE 100 MG/10ML IV SOLN
INTRAVENOUS | Status: DC | PRN
  Administered 2022-02-06: 50 mg via INTRAVENOUS

## 2022-02-06 MED ORDER — VANCOMYCIN HCL IN DEXTROSE 1-5 GM/200ML-% IV SOLN: 1000.0000 mg | Freq: Once | INTRAVENOUS | Status: AC

## 2022-02-06 MED ORDER — 0.9 % SODIUM CHLORIDE (POUR BTL) OPTIME
TOPICAL | Status: DC | PRN
  Administered 2022-02-06: 1000 mL

## 2022-02-06 MED ORDER — SODIUM CHLORIDE 0.9 % IV SOLN: INTRAVENOUS | Status: DC

## 2022-02-06 MED ORDER — SODIUM CHLORIDE (PF) 0.9 % IJ SOLN
INTRAMUSCULAR | Status: DC | PRN
  Administered 2022-02-06: 60 mL

## 2022-02-06 MED ORDER — ONDANSETRON HCL 4 MG/2ML IJ SOLN
INTRAMUSCULAR | Status: AC
  Filled 2022-02-06: qty 2

## 2022-02-06 MED ORDER — MORPHINE SULFATE (PF) 2 MG/ML IV SOLN: 2.0000 mg | INTRAVENOUS | Status: AC | PRN

## 2022-02-06 MED ORDER — POLYETHYLENE GLYCOL 3350 17 G PO PACK: 17.0000 g | PACK | Freq: Every day | ORAL | Status: DC | PRN

## 2022-02-06 MED ORDER — LISINOPRIL 20 MG PO TABS
40.0000 mg | ORAL_TABLET | Freq: Every day | ORAL | Status: DC
  Administered 2022-02-06: 40 mg via ORAL
  Filled 2022-02-06: qty 2

## 2022-02-06 MED ORDER — VENLAFAXINE HCL 37.5 MG PO TABS
37.5000 mg | ORAL_TABLET | Freq: Every day | ORAL | Status: DC
  Administered 2022-02-06 – 2022-02-07 (×2): 37.5 mg via ORAL
  Filled 2022-02-06 (×2): qty 1

## 2022-02-06 MED ORDER — ONDANSETRON HCL 4 MG/2ML IJ SOLN: 4.0000 mg | Freq: Once | INTRAMUSCULAR | Status: DC | PRN

## 2022-02-06 MED ORDER — LIDOCAINE HCL (CARDIAC) PF 100 MG/5ML IV SOSY
PREFILLED_SYRINGE | INTRAVENOUS | Status: DC | PRN
  Administered 2022-02-06: 80 mg via INTRAVENOUS

## 2022-02-06 MED ORDER — MIDAZOLAM HCL 2 MG/2ML IJ SOLN
INTRAMUSCULAR | Status: DC | PRN
  Administered 2022-02-06: 2 mg via INTRAVENOUS

## 2022-02-06 MED ORDER — SODIUM CHLORIDE 0.9% FLUSH: 3.0000 mL | INTRAVENOUS | Status: DC | PRN

## 2022-02-06 MED ORDER — FENTANYL CITRATE (PF) 100 MCG/2ML IJ SOLN
INTRAMUSCULAR | Status: AC
  Filled 2022-02-06: qty 2

## 2022-02-06 MED ORDER — DEXAMETHASONE SODIUM PHOSPHATE 10 MG/ML IJ SOLN
INTRAMUSCULAR | Status: DC | PRN
  Administered 2022-02-06: 10 mg via INTRAVENOUS

## 2022-02-06 MED ORDER — PROPOFOL 10 MG/ML IV BOLUS
INTRAVENOUS | Status: DC | PRN
  Administered 2022-02-06: 100 mg via INTRAVENOUS

## 2022-02-06 MED ORDER — OXYBUTYNIN CHLORIDE 5 MG PO TABS
5.0000 mg | ORAL_TABLET | Freq: Two times a day (BID) | ORAL | Status: DC
  Administered 2022-02-06 – 2022-02-07 (×2): 5 mg via ORAL
  Filled 2022-02-06 (×3): qty 1

## 2022-02-06 MED ORDER — CHLORHEXIDINE GLUCONATE 0.12 % MT SOLN: 15.0000 mL | Freq: Once | OROMUCOSAL | Status: AC

## 2022-02-06 MED ORDER — ONDANSETRON HCL 4 MG/2ML IJ SOLN: 4.0000 mg | Freq: Four times a day (QID) | INTRAMUSCULAR | Status: DC | PRN

## 2022-02-06 MED ORDER — BUPIVACAINE HCL (PF) 0.5 % IJ SOLN
INTRAMUSCULAR | Status: AC
  Filled 2022-02-06: qty 30

## 2022-02-06 MED ORDER — ORAL CARE MOUTH RINSE: 15.0000 mL | Freq: Once | OROMUCOSAL | Status: AC

## 2022-02-06 MED ORDER — GLYCOPYRROLATE 0.2 MG/ML IJ SOLN
INTRAMUSCULAR | Status: DC | PRN
  Administered 2022-02-06: .2 mg via INTRAVENOUS

## 2022-02-06 MED ORDER — SODIUM CHLORIDE 0.9 % IV SOLN
250.0000 mL | INTRAVENOUS | Status: DC
Start: 2022-02-06 — End: 2022-02-08

## 2022-02-06 MED ORDER — FLEET ENEMA 7-19 GM/118ML RE ENEM: 1.0000 | ENEMA | Freq: Once | RECTAL | Status: DC | PRN

## 2022-02-06 MED ORDER — SODIUM CHLORIDE 0.9% FLUSH
3.0000 mL | Freq: Two times a day (BID) | INTRAVENOUS | Status: DC
  Administered 2022-02-06 – 2022-02-07 (×2): 3 mL via INTRAVENOUS

## 2022-02-06 MED ORDER — FLUTICASONE PROPIONATE 50 MCG/ACT NA SUSP
1.0000 | Freq: Every day | NASAL | Status: DC
  Administered 2022-02-07: 1 via NASAL
  Filled 2022-02-06: qty 16

## 2022-02-06 MED ORDER — PHENOL 1.4 % MT LIQD: 1.0000 | OROMUCOSAL | Status: DC | PRN

## 2022-02-06 MED ORDER — SENNA 8.6 MG PO TABS
1.0000 | ORAL_TABLET | Freq: Two times a day (BID) | ORAL | Status: DC
  Administered 2022-02-06 – 2022-02-07 (×2): 8.6 mg via ORAL
  Filled 2022-02-06 (×2): qty 1

## 2022-02-06 MED ORDER — CLOBETASOL PROPIONATE 0.05 % EX OINT
TOPICAL_OINTMENT | Freq: Two times a day (BID) | CUTANEOUS | Status: DC
  Filled 2022-02-06: qty 15

## 2022-02-06 MED ORDER — OXYCODONE HCL 5 MG PO TABS: 10.0000 mg | ORAL_TABLET | ORAL | Status: DC | PRN

## 2022-02-06 MED ORDER — PRAVASTATIN SODIUM 20 MG PO TABS
10.0000 mg | ORAL_TABLET | Freq: Every evening | ORAL | Status: DC
  Administered 2022-02-06 – 2022-02-07 (×2): 10 mg via ORAL
  Filled 2022-02-06 (×2): qty 1

## 2022-02-06 MED ORDER — PROPOFOL 1000 MG/100ML IV EMUL
INTRAVENOUS | Status: AC
  Filled 2022-02-06: qty 100

## 2022-02-06 MED ORDER — KETOROLAC TROMETHAMINE 15 MG/ML IJ SOLN
7.5000 mg | Freq: Four times a day (QID) | INTRAMUSCULAR | Status: AC
  Administered 2022-02-06 – 2022-02-07 (×4): 7.5 mg via INTRAVENOUS
  Filled 2022-02-06 (×4): qty 1

## 2022-02-06 MED ORDER — CEFAZOLIN SODIUM-DEXTROSE 2-4 GM/100ML-% IV SOLN
2.0000 g | INTRAVENOUS | Status: AC
Start: 2022-02-06 — End: 2022-02-06
  Administered 2022-02-06: 2 g via INTRAVENOUS
  Administered 2022-02-06: 1 g via INTRAVENOUS

## 2022-02-06 MED ORDER — METFORMIN HCL 500 MG PO TABS
500.0000 mg | ORAL_TABLET | Freq: Two times a day (BID) | ORAL | Status: DC
  Administered 2022-02-07 (×2): 500 mg via ORAL
  Filled 2022-02-06 (×2): qty 1

## 2022-02-06 MED ORDER — MIDAZOLAM HCL 2 MG/2ML IJ SOLN
INTRAMUSCULAR | Status: AC
  Filled 2022-02-06: qty 2

## 2022-02-06 MED ORDER — ACETAMINOPHEN 500 MG PO TABS
1000.0000 mg | ORAL_TABLET | Freq: Four times a day (QID) | ORAL | Status: AC
  Administered 2022-02-06 – 2022-02-07 (×4): 1000 mg via ORAL
  Filled 2022-02-06 (×4): qty 2

## 2022-02-06 MED ORDER — PHENYLEPHRINE 80 MCG/ML (10ML) SYRINGE FOR IV PUSH (FOR BLOOD PRESSURE SUPPORT)
PREFILLED_SYRINGE | INTRAVENOUS | Status: DC | PRN
  Administered 2022-02-06: 160 ug via INTRAVENOUS

## 2022-02-06 MED ORDER — GABAPENTIN 300 MG PO CAPS
300.0000 mg | ORAL_CAPSULE | Freq: Three times a day (TID) | ORAL | Status: DC
  Administered 2022-02-06 – 2022-02-07 (×3): 300 mg via ORAL
  Filled 2022-02-06 (×3): qty 1

## 2022-02-06 MED ORDER — ONDANSETRON HCL 4 MG/2ML IJ SOLN
INTRAMUSCULAR | Status: DC | PRN
  Administered 2022-02-06: 4 mg via INTRAVENOUS

## 2022-02-06 MED ORDER — ONDANSETRON HCL 4 MG PO TABS: 4.0000 mg | ORAL_TABLET | Freq: Four times a day (QID) | ORAL | Status: DC | PRN

## 2022-02-06 MED ORDER — LIDOCAINE HCL (PF) 2 % IJ SOLN
INTRAMUSCULAR | Status: AC
Start: 2022-02-06 — End: ?
  Filled 2022-02-06: qty 5

## 2022-02-06 MED ORDER — BUPIVACAINE-EPINEPHRINE (PF) 0.5% -1:200000 IJ SOLN
INTRAMUSCULAR | Status: DC | PRN
  Administered 2022-02-06: 6 mL

## 2022-02-06 MED ORDER — PHENYLEPHRINE HCL-NACL 20-0.9 MG/250ML-% IV SOLN
INTRAVENOUS | Status: DC | PRN
  Administered 2022-02-06: 20 ug/min via INTRAVENOUS

## 2022-02-06 MED ORDER — MENTHOL 3 MG MT LOZG: 1.0000 | LOZENGE | OROMUCOSAL | Status: DC | PRN

## 2022-02-06 MED ORDER — SODIUM CHLORIDE 0.9 % IV SOLN
INTRAVENOUS | Status: DC | PRN
  Administered 2022-02-06: .05 ug/kg/min via INTRAVENOUS

## 2022-02-06 MED ORDER — SUCCINYLCHOLINE CHLORIDE 200 MG/10ML IV SOSY
PREFILLED_SYRINGE | INTRAVENOUS | Status: AC
  Filled 2022-02-06: qty 10

## 2022-02-06 MED ORDER — DOCUSATE SODIUM 100 MG PO CAPS
100.0000 mg | ORAL_CAPSULE | Freq: Two times a day (BID) | ORAL | Status: DC
  Administered 2022-02-06 – 2022-02-07 (×2): 100 mg via ORAL
  Filled 2022-02-06 (×2): qty 1

## 2022-02-06 MED ORDER — ADULT MULTIVITAMIN W/MINERALS CH
1.0000 | ORAL_TABLET | Freq: Every day | ORAL | Status: DC
  Administered 2022-02-07: 1 via ORAL
  Filled 2022-02-06: qty 1

## 2022-02-06 MED ORDER — PHENYLEPHRINE HCL (PRESSORS) 10 MG/ML IV SOLN
INTRAVENOUS | Status: AC
  Filled 2022-02-06: qty 1

## 2022-02-06 MED ORDER — OXYCODONE HCL 5 MG PO TABS
5.0000 mg | ORAL_TABLET | ORAL | Status: DC | PRN
  Administered 2022-02-06: 5 mg via ORAL
  Filled 2022-02-06: qty 1

## 2022-02-06 MED ORDER — BISACODYL 10 MG RE SUPP: 10.0000 mg | Freq: Every day | RECTAL | Status: DC | PRN

## 2022-02-06 MED ORDER — BUPIVACAINE LIPOSOME 1.3 % IJ SUSP
INTRAMUSCULAR | Status: AC
  Filled 2022-02-06: qty 20

## 2022-02-06 MED ORDER — FAMOTIDINE 20 MG PO TABS
ORAL_TABLET | ORAL | Status: AC
  Administered 2022-02-06: 20 mg via ORAL
  Filled 2022-02-06: qty 1

## 2022-02-06 MED ORDER — CEFAZOLIN SODIUM-DEXTROSE 2-4 GM/100ML-% IV SOLN
INTRAVENOUS | Status: AC
  Filled 2022-02-06: qty 100

## 2022-02-06 MED ORDER — CEFAZOLIN SODIUM 1 G IJ SOLR
INTRAMUSCULAR | Status: AC
  Filled 2022-02-06: qty 10

## 2022-02-06 MED ORDER — FENTANYL CITRATE (PF) 100 MCG/2ML IJ SOLN
INTRAMUSCULAR | Status: DC | PRN
  Administered 2022-02-06 (×2): 50 ug via INTRAVENOUS

## 2022-02-06 MED ORDER — FENTANYL CITRATE (PF) 100 MCG/2ML IJ SOLN
25.0000 ug | INTRAMUSCULAR | Status: DC | PRN
  Administered 2022-02-06: 25 ug via INTRAVENOUS

## 2022-02-06 MED ORDER — METHOCARBAMOL 1000 MG/10ML IJ SOLN
500.0000 mg | Freq: Four times a day (QID) | INTRAVENOUS | Status: DC | PRN
  Filled 2022-02-06: qty 5

## 2022-02-06 MED ORDER — CHLORHEXIDINE GLUCONATE 0.12 % MT SOLN
OROMUCOSAL | Status: AC
  Administered 2022-02-06: 15 mL via OROMUCOSAL
  Filled 2022-02-06: qty 15

## 2022-02-06 MED ORDER — DEXAMETHASONE SODIUM PHOSPHATE 10 MG/ML IJ SOLN
INTRAMUSCULAR | Status: AC
  Filled 2022-02-06: qty 1

## 2022-02-06 MED ORDER — METHOCARBAMOL 500 MG PO TABS
500.0000 mg | ORAL_TABLET | Freq: Four times a day (QID) | ORAL | Status: DC | PRN
  Administered 2022-02-06: 500 mg via ORAL
  Filled 2022-02-06: qty 1

## 2022-02-06 MED ORDER — VANCOMYCIN HCL IN DEXTROSE 1-5 GM/200ML-% IV SOLN
INTRAVENOUS | Status: AC
  Administered 2022-02-06: 1000 mg via INTRAVENOUS
  Filled 2022-02-06: qty 200

## 2022-02-06 MED ORDER — SODIUM CHLORIDE FLUSH 0.9 % IV SOLN
INTRAVENOUS | Status: AC
  Filled 2022-02-06: qty 10

## 2022-02-06 MED ORDER — REMIFENTANIL HCL 1 MG IV SOLR
INTRAVENOUS | Status: AC
  Filled 2022-02-06: qty 1000

## 2022-02-06 MED ORDER — METHYLPREDNISOLONE ACETATE 40 MG/ML IJ SUSP
INTRAMUSCULAR | Status: DC | PRN
  Administered 2022-02-06: 40 mg

## 2022-02-06 MED ORDER — BUPIVACAINE-EPINEPHRINE (PF) 0.5% -1:200000 IJ SOLN
INTRAMUSCULAR | Status: AC
  Filled 2022-02-06: qty 30

## 2022-02-06 MED ORDER — FAMOTIDINE 20 MG PO TABS: 20.0000 mg | ORAL_TABLET | Freq: Once | ORAL | Status: AC

## 2022-02-06 MED ORDER — ENOXAPARIN SODIUM 40 MG/0.4ML IJ SOSY
40.0000 mg | PREFILLED_SYRINGE | INTRAMUSCULAR | Status: DC
  Administered 2022-02-07: 40 mg via SUBCUTANEOUS
  Filled 2022-02-06: qty 0.4

## 2022-02-06 MED ORDER — METHYLPREDNISOLONE ACETATE 40 MG/ML IJ SUSP
INTRAMUSCULAR | Status: AC
  Filled 2022-02-06: qty 1

## 2022-02-06 SURGICAL SUPPLY — 60 items
BASIN KIT SINGLE STR (MISCELLANEOUS) ×2 IMPLANT
BUR NEURO DRILL SOFT 3.0X3.8M (BURR) ×2 IMPLANT
CHLORAPREP W/TINT 26 (MISCELLANEOUS) ×2 IMPLANT
CNTNR SPEC 2.5X3XGRAD LEK (MISCELLANEOUS) ×1
CONT SPEC 4OZ STER OR WHT (MISCELLANEOUS) ×1
CONTAINER SPEC 2.5X3XGRAD LEK (MISCELLANEOUS) ×1 IMPLANT
COUNTER NEEDLE 20/40 LG (NEEDLE) ×2 IMPLANT
CUP MEDICINE 2OZ PLAST GRAD ST (MISCELLANEOUS) ×2 IMPLANT
DERMABOND ADVANCED (GAUZE/BANDAGES/DRESSINGS) ×1
DERMABOND ADVANCED .7 DNX12 (GAUZE/BANDAGES/DRESSINGS) ×1 IMPLANT
DRAPE C ARM PK CFD 31 SPINE (DRAPES) ×2 IMPLANT
DRAPE LAPAROTOMY 100X77 ABD (DRAPES) ×2 IMPLANT
DRAPE MICROSCOPE SPINE 48X150 (DRAPES) ×2 IMPLANT
DRAPE SURG 17X11 SM STRL (DRAPES) ×2 IMPLANT
DRSG OPSITE POSTOP 4X8 (GAUZE/BANDAGES/DRESSINGS) ×1 IMPLANT
DRSG TEGADERM 2-3/8X2-3/4 SM (GAUZE/BANDAGES/DRESSINGS) ×1 IMPLANT
ELECT CAUTERY BLADE TIP 2.5 (TIP) ×2
ELECT EZSTD 165MM 6.5IN (MISCELLANEOUS) ×2
ELECT REM PT RETURN 9FT ADLT (ELECTROSURGICAL) ×2
ELECTRODE CAUTERY BLDE TIP 2.5 (TIP) ×1 IMPLANT
ELECTRODE EZSTD 165MM 6.5IN (MISCELLANEOUS) ×1 IMPLANT
ELECTRODE REM PT RTRN 9FT ADLT (ELECTROSURGICAL) ×1 IMPLANT
GLOVE BIOGEL PI IND STRL 6.5 (GLOVE) ×1 IMPLANT
GLOVE BIOGEL PI IND STRL 8.5 (GLOVE) ×1 IMPLANT
GLOVE BIOGEL PI INDICATOR 6.5 (GLOVE) ×2
GLOVE BIOGEL PI INDICATOR 8.5 (GLOVE) ×1
GLOVE SURG SYN 6.5 ES PF (GLOVE) ×8 IMPLANT
GLOVE SURG SYN 6.5 PF PI (GLOVE) ×1 IMPLANT
GLOVE SURG SYN 8.5  E (GLOVE) ×4
GLOVE SURG SYN 8.5 E (GLOVE) ×4 IMPLANT
GLOVE SURG SYN 8.5 PF PI (GLOVE) ×3 IMPLANT
GOWN SRG LRG LVL 4 IMPRV REINF (GOWNS) ×1 IMPLANT
GOWN SRG XL LVL 3 NONREINFORCE (GOWNS) ×1 IMPLANT
GOWN STRL NON-REIN TWL XL LVL3 (GOWNS) ×1
GOWN STRL REIN LRG LVL4 (GOWNS) ×1
GRADUATE 1200CC STRL 31836 (MISCELLANEOUS) ×2 IMPLANT
HEMOVAC 400CC 10FR (MISCELLANEOUS) ×1 IMPLANT
KIT SPINAL PRONEVIEW (KITS) ×2 IMPLANT
MANIFOLD NEPTUNE II (INSTRUMENTS) ×2 IMPLANT
MARKER SKIN DUAL TIP RULER LAB (MISCELLANEOUS) ×3 IMPLANT
NDL SAFETY ECLIPSE 18X1.5 (NEEDLE) ×1 IMPLANT
NEEDLE HYPO 18GX1.5 SHARP (NEEDLE) ×1
NEEDLE HYPO 22GX1.5 SAFETY (NEEDLE) ×2 IMPLANT
NS IRRIG 1000ML POUR BTL (IV SOLUTION) ×2 IMPLANT
PACK LAMINECTOMY NEURO (CUSTOM PROCEDURE TRAY) ×2 IMPLANT
PENCIL ELECTRO HAND CTR (MISCELLANEOUS) ×2 IMPLANT
SOLUTION IRRIG SURGIPHOR (IV SOLUTION) ×2 IMPLANT
SPONGE GAUZE 2X2 8PLY STRL LF (GAUZE/BANDAGES/DRESSINGS) ×1 IMPLANT
SURGIFLO W/THROMBIN 8M KIT (HEMOSTASIS) ×2 IMPLANT
SUT DVC VLOC 3-0 CL 6 P-12 (SUTURE) ×1 IMPLANT
SUT ETHILON 3-0 FS-10 30 BLK (SUTURE) ×2
SUT V-LOC 90 ABS DVC 3-0 CL (SUTURE) ×1 IMPLANT
SUT VIC AB 0 CT1 27 (SUTURE) ×2
SUT VIC AB 0 CT1 27XCR 8 STRN (SUTURE) ×1 IMPLANT
SUT VIC AB 2-0 CT1 18 (SUTURE) ×3 IMPLANT
SUTURE EHLN 3-0 FS-10 30 BLK (SUTURE) IMPLANT
SYR 30ML LL (SYRINGE) ×4 IMPLANT
SYR 3ML LL SCALE MARK (SYRINGE) ×2 IMPLANT
TOWEL OR 17X26 4PK STRL BLUE (TOWEL DISPOSABLE) ×6 IMPLANT
TUBING CONNECTING 10 (TUBING) ×2 IMPLANT

## 2022-02-06 NOTE — Progress Notes (Signed)
PHARMACY -  BRIEF ANTIBIOTIC NOTE   Pharmacy has received consult(s) for Cefazolin from an OR provider.  The patient's profile has been reviewed for ht/wt/allergies/indication/available labs.    One time order(s) placed for Cefazolin 2 gm  Further antibiotics/pharmacy consults should be ordered by admitting physician if indicated.                       Thank you, Renda Rolls, PharmD, [facility] 02/06/2022 1:54 AM

## 2022-02-06 NOTE — Anesthesia Preprocedure Evaluation (Signed)
Anesthesia Evaluation  Patient identified by MRN, date of birth, ID band Patient awake    Reviewed: Allergy & Precautions, H&P , NPO status , Patient's Chart, lab work & pertinent test results, reviewed documented beta blocker date and time   History of Anesthesia Complications (+) PONV, Family history of anesthesia reaction and history of anesthetic complications  Airway Mallampati: II   Neck ROM: full    Dental  (+) Poor Dentition, Dental Advidsory Given, Caps   Pulmonary neg pulmonary ROS, former smoker,    Pulmonary exam normal        Cardiovascular Exercise Tolerance: Poor hypertension, On Medications (-) angina+ CAD and + Cardiac Stents  (-) Past MI and (-) CABG Normal cardiovascular exam(-) dysrhythmias + Valvular Problems/Murmurs (s/p TAVR) AS  Rhythm:regular Rate:Normal     Neuro/Psych neg Seizures PSYCHIATRIC DISORDERS Anxiety Depression  Neuromuscular disease negative psych ROS   GI/Hepatic negative GI ROS, Neg liver ROS,   Endo/Other  diabetes  Renal/GU negative Renal ROS  negative genitourinary   Musculoskeletal   Abdominal   Peds  Hematology negative hematology ROS (+)   Anesthesia Other Findings Past Medical History: No date: Anxiety No date: Arthritis No date: Depression 2016: Diverticulosis No date: External hemorrhoid No date: Family history of adverse reaction to anesthesia     Comment:  sister becomes disoriented 2017: Hereditary lymphedema of legs     Comment:  bilaterally No date: History of kidney stones No date: Hyperlipidemia No date: Hypertension 2001: PONV (postoperative nausea and vomiting)     Comment:  kidney stone No date: Pre-diabetes No date: Psoriasis No date: Tubular adenoma of colon Past Surgical History: 2014: bladder stimulater 2014: CATARACT EXTRACTION W/ INTRAOCULAR LENS IMPLANT; Bilateral     Comment:  one eye done then the other eye done a month later No date:  CESAREAN SECTION 2018: EYE SURGERY; Right     Comment:  macular hole 10/27/2016: JOINT REPLACEMENT; Right 2014: JOINT REPLACEMENT; Left 11/29/2016: KNEE CLOSED REDUCTION; Right     Comment:  Procedure: CLOSED MANIPULATION KNEE;  Surgeon: Leanor Kail, MD;  Location: [facility] ORS;  Service: Orthopedics;              Laterality: Right; BMI    Body Mass Index:  38.71 kg/m     Reproductive/Obstetrics negative OB ROS                             Anesthesia Physical  Anesthesia Plan  ASA: 3  Anesthesia Plan: General   Post-op Pain Management:    Induction: Intravenous  PONV Risk Score and Plan: 4 or greater and Ondansetron, Dexamethasone, TIVA, Propofol infusion and Treatment may vary due to age or medical condition  Airway Management Planned: Oral ETT  Additional Equipment:   Intra-op Plan:   Post-operative Plan: Extubation in OR  Informed Consent: I have reviewed the patients History and Physical, chart, labs and discussed the procedure including the risks, benefits and alternatives for the proposed anesthesia with the patient or authorized representative who has indicated his/her understanding and acceptance.     Dental Advisory Given  Plan Discussed with: CRNA  Anesthesia Plan Comments:         Anesthesia Quick Evaluation

## 2022-02-06 NOTE — Discharge Instructions (Signed)
  Your surgeon has performed an operation on your lumbar spine (low back) to relieve pressure on one or more nerves. Many times, patients feel better immediately after surgery and can "overdo it." Even if you feel well, it is important that you follow these activity guidelines. If you do not let your back heal properly from the surgery, you can increase the chance of a disc herniation and/or return of your symptoms. The following are instructions to help in your recovery once you have been discharged from the hospital.  Activity    No bending, lifting, or twisting ("BLT"). Avoid lifting objects heavier than 10 pounds (gallon milk jug).  Where possible, avoid household activities that involve lifting, bending, pushing, or pulling such as laundry, vacuuming, grocery shopping, and childcare. Try to arrange for help from friends and family for these activities while your back heals.  Increase physical activity slowly as tolerated.  Taking short walks is encouraged, but avoid strenuous exercise. Do not jog, run, bicycle, lift weights, or participate in any other exercises unless specifically allowed by your doctor. Avoid prolonged sitting, including car rides.  Talk to your doctor before resuming sexual activity.  You should not drive until cleared by your doctor.  Until released by your doctor, you should not return to work or school.  You should rest at home and let your body heal.   You may shower two days after your surgery.  After showering, lightly dab your incision dry. Do not take a tub bath or go swimming for 3 weeks, or until approved by your doctor at your follow-up appointment.  If you smoke, we strongly recommend that you quit.  Smoking has been proven to interfere with normal healing in your back and will dramatically reduce the success rate of your surgery. Please contact [facility] (800-QUIT-NOW) and use the resources at www.[facility].com for assistance in stopping smoking.  Surgical  Incision   If you have a dressing on your incision, you may remove it three days after your surgery. Keep your incision area clean and dry.  Your incision was closed with Dermabond glue. The glue should begin to peel away within about a week. Diet            You may return to your usual diet. Be sure to stay hydrated.  When to Contact us  Although your surgery and recovery will likely be uneventful, you may have some residual numbness, aches, and pains in your back and/or legs. This is normal and should improve in the next few weeks.  However, should you experience any of the following, contact us immediately: New numbness or weakness Pain that is progressively getting worse, and is not relieved by your pain medications or rest Bleeding, redness, swelling, pain, or drainage from surgical incision Chills or flu-like symptoms Fever greater than 101.0 F (38.3 C) Problems with bowel or bladder functions Difficulty breathing or shortness of breath Warmth, tenderness, or swelling in your calf  Contact Information During office hours (Monday-Friday 9 am to 5 pm), please call your physician at [phone] After hours and weekends, please call [phone] and speak with the answering service, who will contact the doctor on call.  If that fails, call the Speculator Operator at [phone] and ask for the Neurosurgery Resident On Call  For a life-threatening emergency, call 911

## 2022-02-06 NOTE — Anesthesia Procedure Notes (Signed)
Procedure Name: Intubation Date/Time: 02/06/2022 2:23 PM  Performed by: Zetta Bills, CRNAPre-anesthesia Checklist: Patient identified, Patient being monitored, Timeout performed, Emergency Drugs available and Suction available Patient Re-evaluated:Patient Re-evaluated prior to induction Oxygen Delivery Method: Circle system utilized Preoxygenation: Pre-oxygenation with 100% oxygen Induction Type: IV induction Ventilation: Mask ventilation without difficulty Laryngoscope Size: 3 and Glidescope Grade View: Grade I Tube type: Oral Tube size: 7.0 mm Number of attempts: 1 Airway Equipment and Method: Stylet Placement Confirmation: ETT inserted through vocal cords under direct vision, positive ETCO2 and breath sounds checked- equal and bilateral Secured at: 21 cm Tube secured with: Tape Dental Injury: Teeth and Oropharynx as per pre-operative assessment

## 2022-02-06 NOTE — Transfer of Care (Signed)
Immediate Anesthesia Transfer of Care Note  Patient: Brandy Oneill  Procedure(s) Performed: L3-5 DECOMPRESSION (Back)  Patient Location: PACU  Anesthesia Type:General  Level of Consciousness: awake, drowsy and responds to stimulation  Airway & Oxygen Therapy: Patient Spontanous Breathing and Patient connected to face mask oxygen  Post-op Assessment: Report given to RN and Post -op Vital signs reviewed and stable  Post vital signs: Reviewed and stable  Last Vitals:  Vitals Value Taken Time  BP 160/76 02/06/22 1650  Temp 36.1 C 02/06/22 1650  Pulse 83 02/06/22 1657  Resp 17 02/06/22 1657  SpO2 92 % 02/06/22 1657  Vitals shown include unvalidated device data.  Last Pain:  Vitals:   02/06/22 1650  TempSrc:   PainSc: 0-No pain         Complications: No notable events documented.

## 2022-02-06 NOTE — Op Note (Signed)
Indications: Brandy Oneill is a 71 yo female who presents with: Neurogenic claudication due to lumbar spinal stenosis M48.062  She failed conservative management prompting surgical intervention.  Findings: lumbar stenosis  Preoperative Diagnosis: Lumbar Stenosis with neurogenic claudication Postoperative Diagnosis: same   EBL: 30 ml IVF: 1000 ml Drains: 1 placed Disposition: Extubated and Stable to PACU Complications: none  No foley catheter was placed.   Preoperative Note:   Risks of surgery discussed include: infection, bleeding, stroke, coma, death, paralysis, CSF leak, nerve/spinal cord injury, numbness, tingling, weakness, complex regional pain syndrome, recurrent stenosis and/or disc herniation, vascular injury, development of instability, neck/back pain, need for further surgery, persistent symptoms, development of deformity, and the risks of anesthesia. The patient understood these risks and agreed to proceed.  Operative Note:   1. L3-5 lumbar decompression including central laminectomy and bilateral medial facetectomies including foraminotomies  The patient was then brought from the preoperative center with intravenous access established.  The patient underwent general anesthesia and endotracheal tube intubation, and was then rotated on the [place] rail top where all pressure points were appropriately padded.  The skin was then thoroughly cleansed.  Perioperative antibiotic prophylaxis was administered.  Sterile prep and drapes were then applied and a timeout was then observed.  C-arm was brought into the field under sterile conditions and under lateral visualization the L4-5 interspace was identified and marked.  The incision was marked on the right and injected with local anesthetic. Once this was complete a 6 cm incision was opened with the use of a #10 blade knife.    The metrx tubes were sequentially advanced and confirmed in position at L4-5. An 65m by 75mtube was  locked in place to the bed side attachment.  The microscope was then sterilely brought into the field and muscle creep was hemostased with a bipolar and resected with a pituitary rongeur.  A Bovie extender was then used to expose the spinous process and lamina.  Careful attention was placed to not violate the facet capsule. A 3 mm matchstick drill bit was then used to make a hemi-laminotomy trough until the ligamentum flavum was exposed.  This was extended to the base of the spinous process and to the contralateral side to remove all the central bone from each side.  Once this was complete and the underlying ligamentum flavum was visualized, it was dissected with a curette and resected with Kerrison rongeurs.  Extensive ligamentum hypertrophy was noted, requiring a substantial amount of time and care for removal.  The dura was identified and palpated. The kerrison rongeur was then used to remove the medial facet bilaterally until no compression was noted.  A balltip probe was used to confirm decompression of the ipsilateral L5 nerve root.  Additional attention was paid to completion of the contralateral L4-5 foraminotomy until the contralateral traversing nerve root was completely free.  Once this was complete, L4-5 central decompression including medial facetectomy and foraminotomy was confirmed and decompression on both sides was confirmed. No CSF leak was noted.  A Depo-Medrol soaked Gelfoam pledget was placed in the defect.  The wound was copiously irrigated. The tube system was then removed under microscopic visualization and hemostasis was obtained with a bipolar.    After performing the decompression at L4-5, the metrx tubes were sequentially advanced and confirmed in position at L3-4. An 1868my 47m34mbe was locked in place to the bed side attachment.  Fluoroscopy was then removed from the field.  The microscope was then sterilely  brought into the field and muscle creep was hemostased with a bipolar  and resected with a pituitary rongeur.  A Bovie extender was then used to expose the spinous process and lamina.  Careful attention was placed to not violate the facet capsule. A 3 mm matchstick drill bit was then used to make a hemi-laminotomy trough until the ligamentum flavum was exposed.  This was extended to the base of the spinous process and to the contralateral side to remove all the central bone from each side.  Once this was complete and the underlying ligamentum flavum was visualized, it was dissected with a curette and resected with Kerrison rongeurs.  Extensive ligamentum hypertrophy was noted, requiring a substantial amount of time and care for removal.  The dura was identified and palpated. The kerrison rongeur was then used to remove the medial facet bilaterally until no compression was noted.  A balltip probe was used to confirm decompression of the ipsilateral L4 nerve root.  Additional attention was paid to completion of the contralateral foraminotomy until the contralateral L4 nerve root was completely free.  Once this was complete, L3-4 central decompression including medial facetectomy and foraminotomy was confirmed and decompression on both sides was confirmed. No CSF leak was noted.  A Depo-Medrol soaked Gelfoam pledget was placed in the defect.  The wound was copiously irrigated. The tube system was then removed under microscopic visualization and hemostasis was obtained with a bipolar.     The fascial layer was reapproximated with the use of a 0 Vicryl suture.  Subcutaneous tissue layer was reapproximated using 2-0 Vicryl suture.  3-0 monocryl was placed in subcuticular fashion. The skin was then cleansed and Dermabond was used to close the skin opening.  Patient was then rotated back to the preoperative bed awakened from anesthesia and taken to recovery all counts are correct in this case.  I performed the entire procedure with the assistance of Cooper Render PA as an Civil engineer, contracting.  Jamarien Rodkey K. Izora Ribas MD

## 2022-02-06 NOTE — H&P (Signed)
I have reviewed and confirmed my history and physical from 01/17/22 with no additions or changes. Plan for L3-5 decompression.  Risks and benefits reviewed.  Heart sounds normal no MRG. Chest Clear to Auscultation Bilaterally.

## 2022-02-07 ENCOUNTER — Encounter: Payer: Self-pay | Admitting: Neurosurgery

## 2022-02-07 DIAGNOSIS — M48062 Spinal stenosis, lumbar region with neurogenic claudication: Secondary | ICD-10-CM | POA: Diagnosis not present

## 2022-02-07 MED ORDER — SENNA 8.6 MG PO TABS: 1.0000 | ORAL_TABLET | Freq: Every day | ORAL | 0 refills | Status: DC | PRN

## 2022-02-07 MED ORDER — METHOCARBAMOL 500 MG PO TABS: 500.0000 mg | ORAL_TABLET | Freq: Four times a day (QID) | ORAL | 0 refills | Status: DC | PRN

## 2022-02-07 MED ORDER — OXYCODONE HCL 5 MG PO TABS: 5.0000 mg | ORAL_TABLET | Freq: Four times a day (QID) | ORAL | 0 refills | Status: AC | PRN

## 2022-02-07 NOTE — Progress Notes (Signed)
    Attending Progress Note  History: Brandy Oneill is s/p L3-5 decompression.   POD1: NAEO. Pt reports continued numbness in her leg but feels her pre-op leg pain is improved some.   Physical Exam: Vitals:   02/07/22 0313 02/07/22 0812  BP: (!) 118/58 (!) 103/51  Pulse: 71 66  Resp: 15 18  Temp: 98.4 F (36.9 C) 98.1 F (36.7 C)  SpO2: 97% 96%    AA Ox3 CNI  Strength:5/5 throughout  HV 65 overnight   Data:  Recent Labs  Lab 02/06/22 1956  CREATININE 0.93   No results for input(s): "AST", "ALT", "ALKPHOS" in the last 168 hours.  Invalid input(s): "TBILI"   No results for input(s): "WBC", "HGB", "HCT", "PLT" in the last 168 hours. No results for input(s): "APTT", "INR" in the last 168 hours.       Other tests/results: none   Assessment/Plan:  Brandy Oneill is a 72 y.o s/p lumbar decompression   - mobilize - pain control - DVT prophylaxis - will re-evaluate removing drain this afternoon - [place] PA-C Department of Neurosurgery

## 2022-02-07 NOTE — Discharge Summary (Signed)
Physician Discharge Summary  Patient ID: Brandy Oneill MRN: [id number] DOB/AGE: 1950/08/27 72 y.o.  Admit date: 02/06/2022 Discharge date: 02/07/2022  Admission Diagnoses: Neurogenic claudication due to lumbar spinal stenosis M48.062  Discharge Diagnoses:  Principal Problem:   Lumbar stenosis   Discharged Condition: good  Hospital Course:  ALLEX MADIA is a 71 y.o s/p L3-5 lumbar decompression. Her intraoperative course was uncomplicated and she was admitted overnight for pain control, drain output, and therapy evaluation. She continued to have numbness in her feet but was otherwise doing well and improved from pre-op. Therapy saw her and cleared her for discharge home with home health. She was set up with [facility] for this. Her drain output decreased to an acceptable level and was removed on the afternoon of POD1. She was discharged home with Oyxocodone, Robaxin, and Senna.   Consults: None  Significant Diagnostic Studies: none   Treatments: surgery: as above. Please see separately dictated operative report for further details.   Discharge Exam: Blood pressure 128/63, pulse 64, temperature 98.9 F (37.2 C), resp. rate 16, height 5' 3.5" (1.613 m), weight 100.5 kg, SpO2 96 %. AA Ox3 CNI   Strength:5/5 throughout  Incision c/d/i  Disposition: Discharge disposition: 06-Home-Health Care Svc       Discharge Instructions     Incentive spirometry RT   Complete by: As directed    Remove dressing in 24 hours   Complete by: As directed       Allergies as of 02/07/2022       Reactions   Kiwi Extract    Sulfa Antibiotics Other (See Comments)        Medication List     STOP taking these medications    albuterol 108 (90 Base) MCG/ACT inhaler Commonly known as: VENTOLIN HFA   amLODipine 2.5 MG tablet Commonly known as: NORVASC   aspirin 81 MG chewable tablet   meloxicam 15 MG tablet Commonly known as: MOBIC       TAKE these medications    clobetasol  ointment 0.05 % Commonly known as: TEMOVATE Apply topically 2 (two) times daily.   EPIPEN IJ Inject as directed.   fluticasone 50 MCG/ACT nasal spray Commonly known as: FLONASE SHAKE LQ AND U 1 SPR IEN QD   gabapentin 300 MG capsule Commonly known as: NEURONTIN Take 300 mg by mouth 3 (three) times daily.   lisinopril 40 MG tablet Commonly known as: ZESTRIL Take 40 mg by mouth at bedtime.   metFORMIN 500 MG tablet Commonly known as: GLUCOPHAGE Take 500 mg by mouth 2 (two) times daily with a meal.   methocarbamol 500 MG tablet Commonly known as: ROBAXIN Take 1 tablet (500 mg total) by mouth every 6 (six) hours as needed for muscle spasms.   multivitamin with minerals Tabs tablet Take 1 tablet by mouth daily. Centrum for Women   oxybutynin 5 MG tablet Commonly known as: DITROPAN Take 1 tablet (5 mg total) by mouth 2 (two) times daily.   oxyCODONE 5 MG immediate release tablet Commonly known as: Oxy IR/ROXICODONE Take 1 tablet (5 mg total) by mouth every 6 (six) hours as needed for up to 5 days for moderate pain ((score 4 to 6)).   pravastatin 10 MG tablet Commonly known as: PRAVACHOL Take 10 mg by mouth every evening.   senna 8.6 MG Tabs tablet Commonly known as: SENOKOT Take 1 tablet (8.6 mg total) by mouth daily as needed for mild constipation.   venlafaxine 37.5 MG tablet Commonly known as: EFFEXOR Take  37.5 mg by mouth daily.        Follow-up Information     Loleta Dicker, PA Follow up in 2 week(s).   Why: For post-op and incision check. Appointment date and time is listed on your pre-op paperwork Contact information: [place] [address] [phone]                 Signed: Loleta Dicker 02/07/2022, 4:24 PM

## 2022-02-07 NOTE — TOC Transition Note (Signed)
Transition of Care [facility]) - CM/SW Discharge Note   Patient Details  Name: Brandy Oneill MRN: [id number] Date of Birth: Aug 29, 1950  [facility] CM/SW Contact:  Donnelly Angelica, LCSW Phone Number: 02/07/2022, 4:03 PM   Clinical Narrative:    Per MD, Pt will have orders to discharge this evening. HH has been arranged. No further concerns. CSW signing off.    Final next level of care: [place] Barriers to Discharge: Barriers Resolved   Patient Goals and CMS Choice        Discharge Placement                    Patient and family notified of of transfer: 02/07/22  Discharge Plan and Services                          HH Arranged: PT, OT [facility] Agency: [place] Date [place]: 02/07/22   Representative spoke with at [place]: Meg  Social Determinants of Health ([place]) Interventions     Readmission Risk Interventions     No data to display

## 2022-02-07 NOTE — Progress Notes (Signed)
Discharge note: Pt given discharged instructions and verbalized understanding. Pt given walker and 3-in-1 bedside commode, and was wheeled out by staff with all belongings. Pt left via car driven by daughter.

## 2022-02-07 NOTE — TOC Progression Note (Signed)
Transition of Care [facility]) - Progression Note    Patient Details  Name: Brandy Oneill MRN: [id number] Date of Birth: October 19, 1950  Transition of Care [facility]) CM/SW Contact  Donnelly Angelica, LCSW Phone Number: 02/07/2022, 3:57 PM  Clinical Narrative:    CSW verified [place] with [facility]. Meg confirmed that patient is active with them for [facility] services.         Expected Discharge Plan and Services                                                 Social Determinants of Health (SDOH) Interventions    Readmission Risk Interventions     No data to display

## 2022-02-07 NOTE — Care Management Obs Status (Signed)
[place] NOTIFICATION   Patient Details  Name: Brandy Oneill MRN: [id number] Date of Birth: [birth year]   Medicare Observation Status Notification Given:  Yes    Donnelly Angelica, LCSW 02/07/2022, 1:43 PM

## 2022-02-07 NOTE — Progress Notes (Signed)
Physical Therapy Treatment Patient Details Name: Brandy Oneill MRN: [id number] DOB: 1950-10-09 Today's Date: 02/07/2022   History of Present Illness Pt is a 71 yo F s/p L3-5 decompression 02/07/22. PMH HTN, DM, HLD, OA, lymphedema, CAD, BL TKA.    PT Comments    Pt was pleasant and motivated to participate during the session and put forth good effort throughout. Daughter present for family training. Pt was able to perform log roll w/ CGA and verbal cuing for sequencing; attempted using no bedrail but unable to at this time. Pt able to complete sit to stand w/ CGA and min cuing for hand placement. Pt is able to perform steps x4 w/ CGA following cuing for sequencing and with good concentric/eccentric control using LLE. Pt ambulated in hallway 262f using RW w/ CGA and with more steady gait compared to last session; min cuing to maintain RW close. Pt will benefit from HHPT upon discharge to safely address deficits listed in patient problem list for decreased caregiver assistance and eventual return to PLOF.    Recommendations for follow up therapy are one component of a multi-disciplinary discharge planning process, led by the attending physician.  Recommendations may be updated based on patient status, additional functional criteria and insurance authorization.  Follow Up Recommendations  Home health PT     Assistance Recommended at Discharge Frequent or constant Supervision/Assistance  Patient can return home with the following A little help with walking and/or transfers;A little help with bathing/dressing/bathroom;Assistance with cooking/housework;Assist for transportation;Help with stairs or ramp for entrance   Equipment Recommendations  Rolling walker (2 wheels);BSC/3in1    Recommendations for Other Services       Precautions / Restrictions Precautions Precautions: Fall;Back Precaution Booklet Issued: No Precaution Comments: log rolling and spinal precautions for pain  management Restrictions Weight Bearing Restrictions: No Other Position/Activity Restrictions: no brace needed     Mobility  Bed Mobility Overal bed mobility: Needs Assistance Bed Mobility: Supine to Sit, Sit to Supine     Supine to sit: Min guard Sit to supine: Min guard   General bed mobility comments: extra time and effort to complete, verbal cuing for sequencing    Transfers Overall transfer level: Needs assistance Equipment used: Rolling walker (2 wheels) Transfers: Sit to/from Stand Sit to Stand: Min guard           General transfer comment: verbal cuing for hand placement    Ambulation/Gait Ambulation/Gait assistance: Min guard Gait Distance (Feet): 200 Feet Assistive device: Rolling walker (2 wheels) Gait Pattern/deviations: Step-through pattern, Decreased step length - right, Decreased step length - left Gait velocity: decreased     General Gait Details: steady gait w/ no LOB; verbal cuing to maintain RW close   Stairs Stairs: Yes Stairs assistance: Min guard Stair Management: Two rails, Step to pattern Number of Stairs: 4 General stair comments: no LOB w/ good concentric/eccentric control   Wheelchair Mobility    Modified Rankin (Stroke Patients Only)       Balance Overall balance assessment: Needs assistance Sitting-balance support: Feet supported, No upper extremity supported Sitting balance-Leahy Scale: Good     Standing balance support: Bilateral upper extremity supported, During functional activity Standing balance-Leahy Scale: Fair                              Cognition Arousal/Alertness: Awake/alert Behavior During Therapy: WFL for tasks assessed/performed Overall Cognitive Status: Within Functional Limits for tasks assessed  Exercises Other Exercises Other Exercises: pt education for sequencing for stairs Other Exercises: Pt education on log roll  sequencing    General Comments        Pertinent Vitals/Pain Pain Assessment Pain Assessment: No/denies pain    Home Living Family/patient expects to be discharged to:: Private residence Living Arrangements: Alone Available Help at Discharge: Family;Available PRN/intermittently Type of Home: House Home Access: Stairs to enter Entrance Stairs-Rails: Can reach both Entrance Stairs-Number of Steps: 3   Home Layout: Two level;1/2 bath on main level;Able to live on main level with bedroom/bathroom Home Equipment: None      Prior Function            PT Goals (current goals can now be found in the care plan section) Acute Rehab PT Goals Patient Stated Goal: Pt would like to get back independent walking and to walk her dog PT Goal Formulation: With patient Time For Goal Achievement: 02/20/22 Potential to Achieve Goals: Good Progress towards PT goals: Progressing toward goals    Frequency    BID      PT Plan Current plan remains appropriate    Co-evaluation              AM-PAC PT "6 Clicks" Mobility   Outcome Measure  Help needed turning from your back to your side while in a flat bed without using bedrails?: A Little Help needed moving from lying on your back to sitting on the side of a flat bed without using bedrails?: A Little Help needed moving to and from a bed to a chair (including a wheelchair)?: A Little Help needed standing up from a chair using your arms (e.g., wheelchair or bedside chair)?: A Little Help needed to walk in hospital room?: A Little Help needed climbing 3-5 steps with a railing? : A Little 6 Click Score: 18    End of Session Equipment Utilized During Treatment: Gait belt Activity Tolerance: Patient tolerated treatment well Patient left: in chair;with call bell/phone within reach;with chair alarm set Nurse Communication: Mobility status PT Visit Diagnosis: Difficulty in walking, not elsewhere classified (R26.2);Muscle weakness  (generalized) (M62.81);Unsteadiness on feet (R26.81)     Time: 7106-2694 PT Time Calculation (min) (ACUTE ONLY): 30 min  Charges:  $Therapeutic Activity: 8-22 mins                     Turner Daniels, SPT  02/07/2022, 3:38 PM

## 2022-02-07 NOTE — Evaluation (Signed)
Physical Therapy Evaluation Patient Details Name: Brandy Oneill MRN: [id number] DOB: 01/14/1951 Today's Date: 02/07/2022  History of Present Illness  Pt is a 71 yo F s/p L3-5 decompression 02/07/22. PMH HTN, DM, HLD, OA, lymphedema, CAD, BL TKA.  Clinical Impression  Pt was pleasant and motivated to participate during the session and put forth good effort throughout. Pt somewhat impulsive w/ directions. Pt reports numbness/tingling in BLE but even sensation throughout and with RLE MMT 5/5 and LLE 4/5. Pt is able to complete bed mobility w/ log roll w/ CGA and verbal cuing for sequencing and extra time and effort to complete. Pt able to complete sit to stand transfers w/ CGA w/ good functional strength through BLE and min cuing for hand placement; minor instability when first standing but no LOB. Pt was able to ambulate 139f using RW w/ CGA w/ slow but steady gait and no LOB; min verbal cuing to maintain RW close. Pt will benefit from HHPT upon discharge to safely address deficits listed in patient problem list for decreased caregiver assistance and eventual return to PLOF.       Recommendations for follow up therapy are one component of a multi-disciplinary discharge planning process, led by the attending physician.  Recommendations may be updated based on patient status, additional functional criteria and insurance authorization.  Follow Up Recommendations Home health PT    Assistance Recommended at Discharge Intermittent Supervision/Assistance  Patient can return home with the following  A little help with walking and/or transfers;A little help with bathing/dressing/bathroom;Assistance with cooking/housework;Assist for transportation;Help with stairs or ramp for entrance    Equipment Recommendations Rolling walker (2 wheels);BSC/3in1  Recommendations for Other Services       Functional Status Assessment Patient has had a recent decline in their functional status and demonstrates the  ability to make significant improvements in function in a reasonable and predictable amount of time.     Precautions / Restrictions Precautions Precautions: Fall;Back Precaution Booklet Issued: No Precaution Comments: log rolling and spinal precautions for pain management Restrictions Weight Bearing Restrictions: No Other Position/Activity Restrictions: no brace needed      Mobility  Bed Mobility Overal bed mobility: Needs Assistance Bed Mobility: Supine to Sit, Sit to Supine     Supine to sit: Min guard Sit to supine: Min guard   General bed mobility comments: extra time and effort to complete log roll; verbal cuing for sequencing    Transfers Overall transfer level: Needs assistance Equipment used: Rolling walker (2 wheels) Transfers: Sit to/from Stand Sit to Stand: Min guard           General transfer comment: good functional strength through BLE; verbal cuing for hand placement    Ambulation/Gait Ambulation/Gait assistance: Min guard Gait Distance (Feet): 160 Feet Assistive device: Rolling walker (2 wheels) Gait Pattern/deviations: Step-through pattern, Decreased step length - right, Decreased step length - left Gait velocity: decreased     General Gait Details: steady gait w/ no LOB; verbal cuing to maintain RW close  Stairs            Wheelchair Mobility    Modified Rankin (Stroke Patients Only)       Balance Overall balance assessment: Needs assistance Sitting-balance support: Feet supported Sitting balance-Leahy Scale: Good     Standing balance support: Bilateral upper extremity supported, During functional activity Standing balance-Leahy Scale: Fair  Pertinent Vitals/Pain Pain Assessment Pain Assessment: No/denies pain    Home Living Family/patient expects to be discharged to:: Private residence Living Arrangements: Alone;Other (Comment) (daughter available until 6/23) Available Help at  Discharge: Family;Available PRN/intermittently Type of Home: House Home Access: Stairs to enter Entrance Stairs-Rails: Can reach both Entrance Stairs-Number of Steps: 3   Home Layout: Two level;1/2 bath on main level;Able to live on main level with bedroom/bathroom Home Equipment: None      Prior Function Prior Level of Function : Independent/Modified Independent             Mobility Comments: ind household ambulator; limited community ambulation and reports using carts if able. 1 hx of fall reported ADLs Comments: ind w/ ADLs     Hand Dominance        Extremity/Trunk Assessment   Upper Extremity Assessment Upper Extremity Assessment: Overall WFL for tasks assessed    Lower Extremity Assessment Lower Extremity Assessment: Defer to PT evaluation RLE Deficits / Details: MMT 4/5 RLE Sensation: decreased light touch (reports numbness/tingling but even on BLE) LLE Deficits / Details: MMT 5/5 LLE Sensation: decreased light touch (reports numbness/tingling but even on BLE)       Communication   Communication: No difficulties  Cognition Arousal/Alertness: Awake/alert Behavior During Therapy: WFL for tasks assessed/performed Overall Cognitive Status: Within Functional Limits for tasks assessed                                          General Comments      Exercises Other Exercises Other Exercises: Pt education on spinal precautions Other Exercises: Pt education on log roll sequencing   Assessment/Plan    PT Assessment Patient needs continued PT services  PT Problem List Decreased strength;Decreased mobility;Decreased range of motion;Decreased activity tolerance;Decreased balance;Decreased knowledge of precautions;Decreased knowledge of use of DME       PT Treatment Interventions DME instruction;Therapeutic exercise;Gait training;Balance training;Stair training;Neuromuscular re-education;Functional mobility training;Therapeutic  activities;Patient/family education    PT Goals (Current goals can be found in the Care Plan section)  Acute Rehab PT Goals Patient Stated Goal: Pt would like to get back independent walking and to walk her dog PT Goal Formulation: With patient Time For Goal Achievement: 02/20/22 Potential to Achieve Goals: Good    Frequency BID     Co-evaluation               AM-PAC PT "6 Clicks" Mobility  Outcome Measure Help needed turning from your back to your side while in a flat bed without using bedrails?: A Little Help needed moving from lying on your back to sitting on the side of a flat bed without using bedrails?: A Little Help needed moving to and from a bed to a chair (including a wheelchair)?: A Little Help needed standing up from a chair using your arms (e.g., wheelchair or bedside chair)?: A Little Help needed to walk in hospital room?: A Little Help needed climbing 3-5 steps with a railing? : A Little 6 Click Score: 18    End of Session Equipment Utilized During Treatment: Gait belt Activity Tolerance: Patient tolerated treatment well Patient left: in chair;with call bell/phone within reach;with chair alarm set Nurse Communication: Mobility status PT Visit Diagnosis: Difficulty in walking, not elsewhere classified (R26.2);Muscle weakness (generalized) (M62.81);Unsteadiness on feet (R26.81)    Time: 1884-1660 PT Time Calculation (min) (ACUTE ONLY): 38 min   Charges:  Turner Daniels, SPT  02/07/2022, 1:39 PM

## 2022-02-07 NOTE — Evaluation (Signed)
Occupational Therapy Evaluation Patient Details Name: Brandy Oneill MRN: [id number] DOB: July 07, 1951 Today's Date: 02/07/2022   History of Present Illness Pt is a 71 yo F s/p L3-5 decompression 02/07/22. PMH HTN, DM, HLD, OA, lymphedema, CAD, BL TKA.   Clinical Impression   Pt was seen for OT evaluation this date. Prior to hospital admission, pt was independent with all ADLs. Pt lives alone in a two-level house with 3 stairs to enter - pt reported able to live on main level with bedroom/bathroom. Pt presents to acute OT demonstrating impaired ADL performance and functional mobility 2/2 decreased activity tolerance and s/p L3-5 decompression - functional strength/ROM/balance deficits. Pt currently requires CGA + RW with mod vcs for safe hand placement and maintaining pcns for STS. MIN A with mod vcs to maintain pcns for lower body dressing while seated - needs assitance threading underpants onto RLE. CGA + RW for pulling underpants into place while standing. CGA + RW for balance/unsteadiness on feet with mod vcs for sequencing and safe hand placement for toilet t/f - noted posterior lean during ambulation. Pt educated on spinal pcns within functional context and provided handout. Pt left in recliner with all needs met and PT present. Pt would benefit from skilled OT to address noted impairments and functional limitations (see below for any additional details). Upon hospital discharge, recommend STR to maximize pt safety and return to PLOF.      Recommendations for follow up therapy are one component of a multi-disciplinary discharge planning process, led by the attending physician.  Recommendations may be updated based on patient status, additional functional criteria and insurance authorization.   Follow Up Recommendations  Skilled nursing-short term rehab (<3 hours/day)    Assistance Recommended at Discharge Intermittent Supervision/Assistance  Patient can return home with the following A little  help with walking and/or transfers;A little help with bathing/dressing/bathroom;Assistance with cooking/housework;Assist for transportation    Functional Status Assessment  Patient has had a recent decline in their functional status and demonstrates the ability to make significant improvements in function in a reasonable and predictable amount of time.  Equipment Recommendations  BSC/3in1    Recommendations for Other Services       Precautions / Restrictions Precautions Precautions: Fall;Back Precaution Booklet Issued: No Precaution Comments: log rolling and spinal precautions for pain management Restrictions Weight Bearing Restrictions: No Other Position/Activity Restrictions: no brace needed      Mobility Bed Mobility               General bed mobility comments: Not tested, pt in recliner at start and end of session    Transfers Overall transfer level: Needs assistance Equipment used: Rolling walker (2 wheels) Transfers: Sit to/from Stand Sit to Stand: Min guard                  Balance Overall balance assessment: Needs assistance Sitting-balance support: Feet supported, No upper extremity supported Sitting balance-Leahy Scale: Good     Standing balance support: Bilateral upper extremity supported, During functional activity, Reliant on assistive device for balance Standing balance-Leahy Scale: Fair Standing balance comment: Noted posterior lean during ambulation/static standing                           ADL either performed or assessed with clinical judgement   ADL Overall ADL's : Needs assistance/impaired  General ADL Comments: MIN A with mod vcs to maintain pcns for lower body dressing while seated - needs assitance threading underpants onto RLE. CGA + RW for pulling underpants into place while standing. CGA + RW for balance/unsteadiness on feet with mod vcs for sequencing and safe hand  placement for toilet t/f.      Pertinent Vitals/Pain Pain Assessment Pain Assessment: No/denies pain     Hand Dominance     Extremity/Trunk Assessment Upper Extremity Assessment Upper Extremity Assessment: Overall WFL for tasks assessed   Lower Extremity Assessment Lower Extremity Assessment: Defer to PT evaluation        Communication Communication Communication: No difficulties   Cognition Arousal/Alertness: Awake/alert Behavior During Therapy: WFL for tasks assessed/performed Overall Cognitive Status: Within Functional Limits for tasks assessed                                                  Home Living Family/patient expects to be discharged to:: Private residence Living Arrangements: Alone;Other (Comment) (daughter available until 6/23) Available Help at Discharge: Family;Available PRN/intermittently Type of Home: House Home Access: Stairs to enter [employer] of Steps: 3 Entrance Stairs-Rails: Can reach both Home Layout: Two level;1/2 bath on main level;Able to live on main level with bedroom/bathroom     Bathroom Shower/Tub: Teacher, early years/pre: Standard Bathroom Accessibility: Yes   Home Equipment: None          Prior Functioning/Environment Prior Level of Function : Independent/Modified Independent             Mobility Comments: ind household ambulator; limited community ambulation and reports using carts if able. 1 hx of fall reported ADLs Comments: ind w/ ADLs        OT Problem List: Decreased strength;Decreased range of motion;Decreased activity tolerance;Impaired balance (sitting and/or standing);Decreased safety awareness;Decreased knowledge of use of DME or AE      OT Treatment/Interventions: Self-care/ADL training;Therapeutic exercise;Energy conservation;DME and/or AE instruction;Therapeutic activities;Patient/family education;Balance training    OT Goals(Current goals can be found in  the care plan section) Acute Rehab OT Goals Patient Stated Goal: to return to PLOF OT Goal Formulation: With patient Time For Goal Achievement: 02/21/22 Potential to Achieve Goals: Good  OT Frequency: Min 2X/week       AM-PAC OT "6 Clicks" Daily Activity     Outcome Measure Help from another person eating meals?: None Help from another person taking care of personal grooming?: A Little Help from another person toileting, which includes using toliet, bedpan, or urinal?: A Little Help from another person bathing (including washing, rinsing, drying)?: A Lot Help from another person to put on and taking off regular upper body clothing?: A Little Help from another person to put on and taking off regular lower body clothing?: A Little 6 Click Score: 18   End of Session Equipment Utilized During Treatment: Rolling walker (2 wheels)  Activity Tolerance: Patient tolerated treatment well;Patient limited by fatigue Patient left: in chair;with call bell/phone within reach;Other (comment) (Pt left in recliner with PT present)  OT Visit Diagnosis: Unsteadiness on feet (R26.81)                Time: 1423-9532 OT Time Calculation (min): 22 min Charges:  OT General Charges $OT Visit: 1 Visit OT Evaluation $OT Eval Moderate Complexity: 1 Mod OT Treatments $Self Care/Home Management :  8-22 mins  [employer], OTDS  [employer] 02/07/2022, 12:56 PM

## 2022-02-07 NOTE — TOC Progression Note (Signed)
Transition of Care [facility]) - Progression Note    Patient Details  Name: Brandy Oneill MRN: [id number] Date of Birth: 10-26-1950  [facility]) CM/SW Contact  Donnelly Angelica, LCSW Phone Number: 02/07/2022, 1:38 PM  Clinical Narrative:    CSW confirmed that pt is active with [facility] for [facility] post hospital discharge.        Expected Discharge Plan and Services                                                 Social Determinants of Health (SDOH) Interventions    Readmission Risk Interventions     No data to display

## 2022-02-23 ENCOUNTER — Encounter: Payer: Self-pay | Admitting: Neurosurgery

## 2022-02-23 ENCOUNTER — Ambulatory Visit (INDEPENDENT_AMBULATORY_CARE_PROVIDER_SITE_OTHER): Payer: Medicare Other | Admitting: Neurosurgery

## 2022-02-23 VITALS — BP 110/60 | Temp 97.9°F | Ht 63.5 in | Wt 221.0 lb

## 2022-02-23 DIAGNOSIS — Z09 Encounter for follow-up examination after completed treatment for conditions other than malignant neoplasm: Secondary | ICD-10-CM

## 2022-02-23 DIAGNOSIS — Z9889 Other specified postprocedural states: Secondary | ICD-10-CM

## 2022-02-23 DIAGNOSIS — M48062 Spinal stenosis, lumbar region with neurogenic claudication: Secondary | ICD-10-CM

## 2022-02-23 MED ORDER — METHYLPREDNISOLONE 4 MG PO TBPK: ORAL_TABLET | ORAL | 0 refills | Status: DC
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
## 2022-03-02 ENCOUNTER — Telehealth: Payer: Self-pay

## 2022-03-02 NOTE — Telephone Encounter (Signed)
-----   Message from Peggyann Shoals sent at 03/02/2022  9:37 AM EDT ----- Regarding: rt leg pain Contact: [phone]  L3-5 posterior spinal decompression on 02/06/22 She was seen and prescribed prednisone. She was told to call back if that did not help. What else can you give her for right leg pain? [place]

## 2022-03-03 NOTE — Telephone Encounter (Signed)
Left a message for the patient to call back.  

## 2022-03-06 NOTE — Telephone Encounter (Signed)
Patient has been notified and is agreeable to increasing her gabapentin to '600mg'$  in the evening. She does not need a refill at this time, and she has also been notified to call us if this doesn't help. She verbalized understanding on all.

## 2022-03-28 ENCOUNTER — Ambulatory Visit: Payer: Medicare Other | Admitting: Neurosurgery

## 2022-03-28 ENCOUNTER — Encounter: Payer: Self-pay | Admitting: Neurosurgery

## 2022-03-28 VITALS — BP 121/74 | HR 83 | Temp 99.0°F | Ht 63.5 in | Wt 219.4 lb

## 2022-03-28 DIAGNOSIS — R2689 Other abnormalities of gait and mobility: Secondary | ICD-10-CM

## 2022-03-28 DIAGNOSIS — M48062 Spinal stenosis, lumbar region with neurogenic claudication: Secondary | ICD-10-CM

## 2022-03-28 NOTE — Progress Notes (Signed)
   DOS: 02/06/22 (L3-5 decompression)  HISTORY OF PRESENT ILLNESS: 03/28/2022 [name] is status post the above surgery.  She is not really having back pain currently, but she is having numbness below her knees.  This been very bothersome for her.  She feels unsteady on her feet.   She has also expressed that she is having some problem with her mood she finds it depressing that she cannot walk.  PHYSICAL EXAMINATION:   Vitals:   03/28/22 1342  BP: 121/74  Pulse: 83  Temp: 99 F (37.2 C)   General: Patient is well developed, well nourished, calm, collected, and in no apparent distress.  NEUROLOGICAL:  General: In no acute distress.  Awake, alert, oriented to person, place, and time. Pupils equal round and reactive to light.   Strength:  Side Iliopsoas Quads Hamstring PF DF EHL  R '5 5 5 5 5 5  '$ L '5 5 5 5 5 5   '$ Incision c/d/i   ROS (Neurologic): Negative except as noted above  IMAGING: No interval imaging to review   ASSESSMENT/PLAN:  Brandy Oneill is doing fair after lumbar decompression.  I am optimistic that she will improve.  Will start some physical therapy for her balance.  I have approved for her to go in the pool for aquatic therapy and water aerobics.  I will see her back in 6 weeks.  If she is not better at that time, we will consider EMG and lumbar MRI.  I spent a total of 15 minutes in face-to-face and non-face-to-face activities related to this patient's care today.   Meade Maw MD, [facility] Department of Neurosurgery

## 2022-04-11 ENCOUNTER — Encounter: Payer: Self-pay | Admitting: Physical Therapy

## 2022-04-11 ENCOUNTER — Ambulatory Visit: Payer: Medicare Other | Attending: Neurosurgery | Admitting: Physical Therapy

## 2022-04-11 DIAGNOSIS — M25652 Stiffness of left hip, not elsewhere classified: Secondary | ICD-10-CM | POA: Diagnosis present

## 2022-04-11 DIAGNOSIS — R209 Unspecified disturbances of skin sensation: Secondary | ICD-10-CM | POA: Insufficient documentation

## 2022-04-11 DIAGNOSIS — M5459 Other low back pain: Secondary | ICD-10-CM | POA: Insufficient documentation

## 2022-04-11 DIAGNOSIS — R262 Difficulty in walking, not elsewhere classified: Secondary | ICD-10-CM | POA: Insufficient documentation

## 2022-04-11 DIAGNOSIS — R293 Abnormal posture: Secondary | ICD-10-CM | POA: Diagnosis present

## 2022-04-11 DIAGNOSIS — M25651 Stiffness of right hip, not elsewhere classified: Secondary | ICD-10-CM | POA: Diagnosis present

## 2022-04-11 DIAGNOSIS — R2689 Other abnormalities of gait and mobility: Secondary | ICD-10-CM | POA: Insufficient documentation

## 2022-04-11 DIAGNOSIS — M6281 Muscle weakness (generalized): Secondary | ICD-10-CM | POA: Diagnosis present

## 2022-04-11 DIAGNOSIS — M48062 Spinal stenosis, lumbar region with neurogenic claudication: Secondary | ICD-10-CM | POA: Diagnosis not present

## 2022-04-11 DIAGNOSIS — R2681 Unsteadiness on feet: Secondary | ICD-10-CM | POA: Diagnosis present

## 2022-04-11 NOTE — Therapy (Signed)
OUTPATIENT PHYSICAL THERAPY THORACOLUMBAR EVALUATION   Patient Name: Brandy Oneill MRN: [id number] DOB:[birth year], 71 y.o., female Today's Date: 04/11/2022   PT End of Session - 04/11/22 1523     Visit Number 1    Number of Visits 24    Date for PT Re-Evaluation 07/04/22    Authorization Type MEDICARE PART B reporting period from 04/11/2022    Progress Note Due on Visit 10    PT Start Time 1300    PT Stop Time 1345    PT Time Calculation (min) 45 min    Activity Tolerance Patient tolerated treatment well    Behavior During Therapy [facility] for tasks assessed/performed             Past Medical History:  Diagnosis Date   Anxiety    Arthritis    CAD (coronary artery disease) 04/14/2020   a.) RHC 04/14/2020: mod CAD with sev obstructive Dz in large dom RCA; staged PCI planned. b.) PCI 04/28/2020: DES (unknown type) placed to dRCA.   Depression    Diastolic dysfunction 41/32/4401   a.) TTE 01/06/2019: EF >55%; triv panvalvular regurgitation; G1DD. b.) TTE 01/26/2020: EF > 55%; mod LVH; triv MR/TR/PR; G1DD.   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 b.) TTE 01/26/2020: EF >55%; sev AS (MPG 47.1 mmHg). c.) RHC 04/14/2020: norm CI, PVR, RAP; PCWP 10. d.) s/p TAVR 05/11/2020. e.) TTE 06/01/2020: EF >55%; mild LVH; MPG 9 mmHg. f.) TTE 06/16/2021: EF >55%; mild LVH; GLS -11.6%; MPG 24 mmHg.   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   a.) 23 mm Edwards Sapien S3 bioprosthetic valve   T2DM (type  2 diabetes mellitus) ([place])    Tubular adenoma of colon    Varicose veins of both legs with edema    Past Surgical History:  Procedure Laterality Date   bladder stimulater  2014   CARDIAC VALVE REPLACEMENT     CATARACT EXTRACTION W/ INTRAOCULAR LENS IMPLANT Bilateral 2014   one eye done then the other eye done a month later   [place] N/A 06/26/2018   Procedure: COLONOSCOPY WITH PROPOFOL;  Surgeon: Toledo, Benay Pike, MD;  Location: [facility];  Service: Gastroenterology;  Laterality: N/A;   EYE SURGERY Right 2018   macular hole   JOINT REPLACEMENT Right 10/27/2016   JOINT REPLACEMENT Left 2014   KNEE CLOSED REDUCTION Right 11/29/2016   Procedure: CLOSED MANIPULATION KNEE;  Surgeon: Leanor Kail, MD;  Location: [facility] ORS;  Service: Orthopedics;  Laterality: Right;   LUMBAR LAMINECTOMY/DECOMPRESSION MICRODISCECTOMY N/A 02/06/2022   Procedure: L3-5 DECOMPRESSION;  Surgeon: Meade Maw, MD;  Location: [facility] ORS;  Service: Neurosurgery;  Laterality: N/A;   Patient Active Problem List   Diagnosis Date Noted   Lumbar stenosis 02/06/2022   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   CAD (coronary artery disease) 04/28/2020   S/P angioplasty with stent 04/28/2020   Nonrheumatic aortic valve stenosis 03/12/2020  Essential hypertension 02/06/2019   Hyperlipidemia 02/06/2019   Spinal stenosis of lumbar region with radiculopathy 09/27/2018   BMI 38.0-38.9,adult 09/26/2018   Back pain 09/05/2018   Diet-controlled type 2 diabetes mellitus ([place]) 03/27/2018   Postmenopausal 03/27/2018   DJD (degenerative joint disease) 02/04/2018   History of angioedema 09/17/2017   Hyperglycemia 09/17/2017   Lymphedema 02/04/2017   Chronic venous insufficiency 02/04/2017   Swelling of limb 02/04/2017   Pure hypercholesterolemia 10/02/2016   History of total knee arthroplasty 09/20/2016   Varicose veins of both legs with edema 03/25/2016    Abnormal mammogram 06/01/2015   Urinary incontinence in female 06/01/2015   Prediabetes 06/16/2014   Anxiety and depression 01/28/2014   Psoriasis 01/28/2014    PCP: Tracie Harrier, MD  REFERRING PROVIDER: Meade Maw, MD  REFERRING DIAG: spinal stenosis of lumbar region with neurogenic claudication, imbalance  Rationale for Evaluation and Treatment: Rehabilitation  THERAPY DIAG:  Other low back pain  Unspecified disturbances of skin sensation  Difficulty in walking, not elsewhere classified  Unsteadiness on feet  Muscle weakness (generalized)  Stiffness of right hip, not elsewhere classified  Stiffness of left hip, not elsewhere classified  Abnormal posture  ONSET DATE: 02/06/2022  SUBJECTIVE:                                                                                                                                                                                           SUBJECTIVE STATEMENT: Patient reports she had spinal decompression on February 06, 2022 and since then her blot legs went numbn from just below her knees down and her balance is off. She feels like she is falling all the time. She has not actually fallen since her surgery, but she does not go anywhere really. She denies having symptoms like this before. She states she cannot tell where her feet are going like she is walking on an air mattress or something. IT started on the tops of her feet after surgery that were burning. Then the burning went away and the numbness came about. She feels like it is staying the same since it started, but her mind/mood is getting worse. Before her surgery she had a sore back that prevented her from standing or walking for more than a few minutes. She denies ever having pain down her legs or sciatica in the past. She states her legs didn't hurt at first after surgery. The tops of her feet started burning. She states she was so out of it after surgery she cannot  remember when it started. She knows it was present at her first post-op check up. B LE are the  same, one no worse than the other. She also states her legs are weak. She did not have trouble climbing steps prior to surgery and now she has to pull her self. "It's just like my whole lower body went out of commission." She states she did have pain really low in her back at the tops of her hips when it first started and she used to sit with ice on it. Gabapentin did not seem to help so she quit it because it was making her sick. Some days are worse than others for her symptoms. She does not have trouble standing from the toilet. She endorses diabetes diagnosed in June 2023 right before surgery. He had her blood glucose checked every 6 months prior to that. She was pre-diabetic for a couple years. Last A1C  7.2. Prior to surgery, patient was going to water aerobics 3x a week. She has just started back this week. She states she was not able to do much during the 6 weeks of back precautions. She has a RW in the car, but did not bring it into the clinic because it is so bulky and it is just a short way to the clinic. She admits to feeling embarrassed to have to use it but feels people wonder what is wrong with her and want to help her when they see her out because she is having so much trouble walking.   PERTINENT HISTORY:  Patient is a 71 y.o. female who presents to outpatient physical therapy with a referral for medical diagnosis spinal stenosis of lumbar region with neurogenic claudication, imbalance. This patient's chief complaints consist of numbness and weakness in her B lower legs following L3-5 lumbar decompression including central laminectomy and bilateral medial facetectomies including foraminotomies on  02/06/22 and difficulty with mobility and balance leading to the following functional deficits: difficulty with anything that requires walking, standing, and/or upright balance including shopping, carrying,  grocery shopping, navigating stairs, social outings, housework, yardwork, bathing and dressing. Relevant past medical history and comorbidities include anxiety, arthritis, CAD with stents placed (monitored 1x a year by GP), depression, diastolic dysfunction, diverticulosis, hereditary lymphedema of B legs, history of angioedema, hyperlipidemia, HTN, lumbar spinal stenosis, s/P TAVR (05/11/2020), T2DM, Tubular adenoma of colon, varicose veins of both legs with edema, bladder stimulator (after bladder damaged from attempts to break up kidney stones per pt), bilateral cataract surgery, B knee replacements (R 2018 with manipulation, L 2014), lumbar laminectomy/decompression/microdiscectomy at L3-5 (02/06/2022), urinary incontinence, former smoker.  Patient denies hx of cancer, stroke, seizures, lung problems, unexplained weight loss, and osteoporosis. She endorses worsening urinary incontinence since back surgery; has a history of bladder damage that made it hard for her to get to control her urine once she felt urgency; now, she does not feel urgency since having her back surgery. She has a bladder stimulator that is not working since right before [employer].  PAIN:  Are you having pain? Yes: NPRS scale: Current: 6-7/10,  Best: 6-7/10, Worst: 6-7/10. Pain location: bilateral lower legs from tibial tuberosity down.  Pain description: numbness, weakness Aggravating factors: standing up and walking around Relieving factors: in bed, lymphapress for lymphedema  FUNCTIONAL LIMITATIONS: difficulty with anything that requires walking, standing, and/or upright balance including shopping, carrying, grocery shopping, navigating stairs, social outings, housework, yardwork, bathing and dressing.   PRECAUTIONS: Other: do not lift anything over 25#  WEIGHT BEARING RESTRICTIONS No  FALLS:  Has patient fallen in last 6 months? No  LIVING ENVIRONMENT: Lives with:  lives alone Lives in: [place], two story  home.  Stairs: 3 steps to enter with R handrail; 11 steps with handrail on left to 2nd story where she sleeps.  Has following equipment at home: Single point cane, Walker - 2 wheeled, and shower chair  OCCUPATION: retired Pharmacist, hospital  LEISURE: gardening, going to the movies,   PLOF: Independent and able to get up and down stairs fine and legs did not feel weak.   PATIENT GOALS "to be able to walk right again"   OBJECTIVE  DIAGNOSTIC FINDINGS:  Lumbar Spine CT report from 12/30/2021:  CLINICAL DATA:  History of back pain with neurogenic claudication due to lumbar spinal stenosis.   EXAM: LUMBAR MYELOGRAM   CT LUMBAR MYELOGRAM   FLUOROSCOPY: 55.40 mGy air kerma   PROCEDURE: After thorough discussion of risks and benefits of the procedure including bleeding, infection, injury to nerves, blood vessels, adjacent structures as well as headache and CSF leak, written and oral informed consent was obtained. Consent was obtained by Soyla Dryer, NP. Time out form was completed.   Patient was positioned prone on the fluoroscopy table. Local anesthesia was provided with 1% lidocaine without epinephrine after prepped and draped in the usual sterile fashion. Puncture was performed at L5-S1 using a 3 1/2 inch 20-gauge spinal needle via midline approach. Using a single pass through the dura, the needle was placed within the thecal sac, with return of clear CSF. 20 mL of Omnipaque 180 was injected into the thecal sac, with normal opacification of the nerve roots and cauda equina consistent with free flow within the subarachnoid space.   I personally performed the lumbar puncture and administered the intrathecal contrast. Read by: Soyla Dryer, NP. Procedure supervised by Maurine Simmering, MD.   TECHNIQUE: Contiguous axial images were obtained through the Lumbar spine after the intrathecal infusion of infusion. Coronal and sagittal reconstructions were obtained of the axial image sets.    COMPARISON:  CT 10/24/2021.   FINDINGS: LUMBAR MYELOGRAM FINDINGS:   Successful lumbar puncture at L5-S1 with free-flowing contrast in the subarachnoid space of the thecal sac, and with normal opacification of the nerve roots and cauda equina. There is interruption of contrast flow at L3-L4 and L4-L5 consistent with spinal canal stenosis.   CT LUMBAR MYELOGRAM FINDINGS:   Segmentation: 5 lumbar type vertebrae.   Alignment: Grade 1 anterolisthesis at L4-L5.   Vertebrae: No evidence of lumbar spine fracture. No aggressive osseous lesion.   Paraspinal and other soft tissues: Aortoiliac atherosclerosis. No other significant findings.   Disc levels:   T12-L1: No significant spinal canal or neural foraminal stenosis.   L1-L2: Minimal disc bulging. Mild bilateral facet arthropathy. No significant spinal canal or neural foraminal stenosis.   L2-L3: Mild disc height loss with broad-based disc bulging, ligament flavum hypertrophy and mild bilateral facet arthropathy. There is mild spinal canal stenosis and mild left-sided neural foraminal stenosis. No right neural foraminal stenosis.   L3-L4: Mild disc height loss with asymmetric left disc bulging, ligamentum flavum hypertrophy and mild facet arthropathy. There is moderate-severe spinal canal stenosis with partial effacement of the thecal sac, mild-to-moderate left and no significant right neural foraminal stenosis.   L4-L5: Disc height loss with grade 1 anterolisthesis, disc uncovering and broad-based disc bulging. There is severe spinal canal stenosis with effacement of the thecal sac. Advanced bilateral facet arthropathy. There is mild bilateral neural foraminal stenosis.   L5-S1: Advanced bilateral facet arthropathy. No significant spinal canal stenosis. There is foraminal endplate spurring and facet  spurring resulting in abutment of the right extraforaminal nerve root (series 8, image 28). Mild left neural foraminal  narrowing.   IMPRESSION: LUMBAR MYELOGRAM IMPRESSION:   Successful lumbar puncture at L5-S1 and intrathecal contrast injection for lumbar myelogram. Notable interruption of contrast flow at L3-L4 and L4-L5 consistent with spinal canal stenosis.   CT LUMBAR MYELOGRAM IMPRESSION:   Multilevel degenerative changes of lumbar spine, most significant at L4-L5, where there is grade 1 anterolisthesis, broad-based disc bulging and advanced bilateral facet arthropathy resulting in severe spinal canal stenosis with effacement of the thecal sac. Mild bilateral neural foraminal stenosis at L4-L5.   Moderate-severe spinal canal stenosis with partial effacement of the thecal sac at L3-L4, and with mild to moderate left-sided neural foraminal stenosis at this level.   Foraminal endplate spurring and facet spurring abuts the right exiting extraforaminal nerve root at L5-S1. Mild left neural foraminal stenosis at this level.   Mild spinal canal and left-sided neural foraminal narrowing at L2-L3.     Electronically Signed   By: Maurine Simmering M.D.   On: 12/30/2021 12:15  OBSERVATION/INSPECTION Posture Posture (standing): flexed forwards at hips and low back, hips shifted right, B flexion in knees, left toe out.  Anthropometrics Tremor: none Body composition: obese Muscle bulk: decreased in bilateral calf region Skin: The incision site is closed and well healed. Edema: bilateral lower legs Functional Mobility Bed mobility: supine <> sit and rolling mod I for increased time/effort. Reports vertigo with rolling from right to left.  Transfers: sit <> stand mod I for increased time/effort care to prevent LOB. Prefers to use B UE support but can stand up without it but does demo tendency to experience LOB backwards.  Gait: ambulates unsteady with no AD with stooped posture (increased lumbar and hip flexion), heels close together but with significant toe out, short step length, slow pace, reaching  out with B UE for balance, B knee flexed.   SPINE MOTION  LUMBAR SPINE AROM *Indicates pain Flexion: fingers to toes, knees bent (reports she used to be able to place hands on floor).  Extension: barely able to get near neutral, unsteady and almost fell, knees slightly flexed. Loses balance and needs nearby plinth for UE support.  Side Flexion:   R 25%  L 50% Rotation:  R 25% L 25%  NEUROLOGICAL Dermatomes/Sensation Sensation to light touch appears diminished throughout lower legs below the knee, but patient can identify location of all touches with eyes closed.   PERIPHERAL JOINT MOTION (in degrees)  PASSIVE RANGE OF MOTION (PROM) Significant limitation in B knee extension; B hip extension limited to approximately neutral. L hip IR limited to neutral in 90 degrees flexion.   MUSCLE PERFORMANCE (MMT):  *Indicates pain 04/11/22 Date Date  Joint/Motion R/L R/L R/L  Hip     Flexion (L1, L2) 4+/4+ / /  Abduction 3-/3- / /  Knee     Extension (L3) 5/5 / /  Flexion (S2) 4+/4 / /  Ankle/Foot     Dorsiflexion (L4) 3/4+ / /  Great toe extension (L5) 3/4+ / /  Eversion (S1) 4+/5 / /  Plantarflexion (S1) 5/5 / /  Comments:  04/11/2022: unable to extend B hip off mat past neutral in prone but with at least 4/5 strength in slightly flexed position.   SPECIAL TESTS: LOWER LIMB NEURODYNAMIC TESTS Straight Leg Raise (Sciatic nerve)  R  = negative   L  = negative HIP SPECIAL TESTS FABER: R = positive for posterior hip  pain (non-concordant), L = positive for tightness.  PALPATION: Localized TTP over right superior lateral glute  FUNCTIONAL/BALANCE TESTS: Five Time Sit to Stand (5TSTS): 10 seconds form 18.5 inch plinth with no UE support, one LOB backwards caught by lean on plinth with back of legs and correction for next rep.   Static balance: -Narrow stance, eyes open: >30 seconds -Narrow stance, eyes closed: 2 seconds -Tandem stance, eyes open: R = 16 seconds, L = 5 seconds  (increased sway and shakiness in B LE bilaterally).    TODAY'S TREATMENT  education   PATIENT EDUCATION:  Education details: Education on diagnosis, prognosis, POC, anatomy and physiology of current condition. Importance of using walker to prevent falls until she is more steady on her feet.  Person educated: Patient Education method: Explanation Education comprehension: verbalized understanding and needs further education   HOME EXERCISE PROGRAM: TBD  ASSESSMENT:  CLINICAL IMPRESSION: Patient is a 71 y.o. female referred to outpatient physical therapy with a medical diagnosis of spinal stenosis of lumbar region with neurogenic claudication and imbalance who presents with signs and symptoms consistent with unsteadiness on feet, loss of sensation, and difficulty with strength and motor control of B lower legs as well as hip and back stiffness with weakness in right dorsiflexion and great toe extension. She also reported increased urinary incontinence since surgery where she leaks without urge, so she has been wearing diapers. Lower leg symptoms appear similar to that expected from peripheral neuropathy. R ankle/foot weakness suggests possible former or current compromise of L4 or L5 nerve root, but could also be related to neuropathy symptoms. Patient denies weakness or numbness in B lower legs prior to recent back surgery and and chart review lacks prior record of involvement of patient's lower legs, ankles, or feet.  Patient's symptoms were unable to be worsened or relieved with exam today and PT was unable to establish a direct link between lumbar spine and lower leg numbness/weakness. Patient is also suffering from significant stooped posture with lack of hip and back extension ROM contributing. This is likely related to having difficulty with maintaining standing postures for a prolonged period related to spinal stenosis and back pain in standing postures. Patient presents with significant  pain, ROM, sensation, balance, motor control, posture, joint stiffness, gait, muscle tension, muscle performance (strength/power/endurance) and activity tolerance impairments that are limiting ability to complete her usual activities including anything that requires walking, standing, and/or upright balance including shopping, carrying, grocery shopping, navigating stairs, social outings, housework, yardwork, bathing and dressing without difficulty. Patient will benefit from skilled physical therapy intervention to address current body structure impairments and activity limitations to improve function and work towards goals set in current POC in order to return to prior level of function or maximal functional improvement.    OBJECTIVE IMPAIRMENTS Abnormal gait, decreased activity tolerance, decreased balance, decreased coordination, decreased endurance, decreased knowledge of condition, decreased knowledge of use of DME, decreased mobility, difficulty walking, decreased ROM, decreased strength, dizziness, hypomobility, increased edema, impaired perceived functional ability, impaired flexibility, impaired sensation, improper body mechanics, postural dysfunction, obesity, and pain.   ACTIVITY LIMITATIONS carrying, lifting, standing, squatting, stairs, transfers, bed mobility, continence, bathing, dressing, reach over head, locomotion level, and caring for others  PARTICIPATION LIMITATIONS: meal prep, cleaning, laundry, interpersonal relationship, shopping, community activity, yard work, and   anything that requires walking, standing, and/or upright balance including shopping, carrying, grocery shopping, navigating stairs, social outings, housework, yardwork, bathing and dressing, gardening, going to the movies.   PERSONAL  FACTORS Age, Fitness, Time since onset of injury/illness/exacerbation, and 3+ comorbidities:   anxiety, arthritis, CAD with stents placed (monitored 1x a year by GP), depression, diastolic  dysfunction, diverticulosis, hereditary lymphedema of B legs, history of angioedema, hyperlipidemia, HTN, lumbar spinal stenosis, s/P TAVR (05/11/2020), T2DM, Tubular adenoma of colon, varicose veins of both legs with edema, bladder stimulator (after bladder damaged from attempts to break up kidney stones per pt), bilateral cataract surgery, B knee replacements (R 2018 with manipulation, L 2014), lumbar laminectomy/decompression/microdiscectomy at L3-5 (02/06/2022), urinary incontinence, former smoker are also affecting patient's functional outcome.   REHAB POTENTIAL: Good  CLINICAL DECISION MAKING: Evolving/moderate complexity  EVALUATION COMPLEXITY: Moderate   GOALS: Goals reviewed with patient? No  SHORT TERM GOALS: Target date: 04/25/2022  Patient will be independent with initial home exercise program for self-management of symptoms. Baseline: Initial HEP to be provided at visit 2 as appropriate (04/11/22); Goal status: INITIAL   LONG TERM GOALS: Target date: 07/04/2022  Patient will be independent with a long-term home exercise program for self-management of symptoms.  Baseline: Initial HEP to be provided at visit 2 as appropriate (04/11/22); Goal status: INITIAL  2.  Patient will demonstrate improved FOTO by equal or greater than 10 points to demonstrate improvement in overall condition and self-reported functional ability.  Baseline: to be measured visit 2 as appropriate (04/11/22); Goal status: INITIAL  3.  Patient will ambulate equal or greater than 1000 feet on 6 Minute Walk Test with no AD to demonstrate improved ability to safely complete household and community ambulation without falls.  Baseline: to be tested visit 2 as appropriate (04/11/22); Goal status: INITIAL  4.  Patient will demonstrate the ability to stand in tandem stance for equal or greater than 30 seconds on each side to demonstrate improved static balance for tasks such as cooking and gardening.  Baseline:  R  = 16 seconds, L = 5 seconds  (04/11/22); Goal status: INITIAL  5.  Patient will complete community, work and/or recreational activities without limitation due to current condition.  Baseline: difficulty with anything that requires walking, standing, and/or upright balance including shopping, carrying, grocery shopping, navigating stairs, social outings, housework, yardwork, bathing and dressing, walking on uneven ground (04/11/22); Goal status: INITIAL    PLAN: PT FREQUENCY: 1-2x/week  PT DURATION: 12 weeks  PLANNED INTERVENTIONS: Therapeutic exercises, Therapeutic activity, Neuromuscular re-education, Balance training, Gait training, Patient/Family education, Joint mobilization, Stair training, DME instructions, Aquatic Therapy, Dry Needling, Electrical stimulation, Spinal mobilization, Cryotherapy, Moist heat, Manual therapy, and Re-evaluation.  PLAN FOR NEXT SESSION: update HEP as appropriate, progressive balance and functional/LE strengthening as appropriate.    Everlean Alstrom. Graylon Good, PT, DPT 04/11/22, 4:12 PM  [facility] [address] [place],  [address] P: [phone] I F: [phone]

## 2022-04-12 ENCOUNTER — Encounter: Payer: Medicare Other | Admitting: Physical Therapy

## 2022-04-13 ENCOUNTER — Encounter: Payer: Self-pay | Admitting: Physical Therapy

## 2022-04-13 ENCOUNTER — Ambulatory Visit: Payer: Medicare Other | Admitting: Physical Therapy

## 2022-04-13 VITALS — BP 130/63 | HR 85

## 2022-04-13 DIAGNOSIS — M25652 Stiffness of left hip, not elsewhere classified: Secondary | ICD-10-CM

## 2022-04-13 DIAGNOSIS — M25651 Stiffness of right hip, not elsewhere classified: Secondary | ICD-10-CM

## 2022-04-13 DIAGNOSIS — R209 Unspecified disturbances of skin sensation: Secondary | ICD-10-CM

## 2022-04-13 DIAGNOSIS — R262 Difficulty in walking, not elsewhere classified: Secondary | ICD-10-CM

## 2022-04-13 DIAGNOSIS — M5459 Other low back pain: Secondary | ICD-10-CM | POA: Diagnosis not present

## 2022-04-13 DIAGNOSIS — R2681 Unsteadiness on feet: Secondary | ICD-10-CM

## 2022-04-13 DIAGNOSIS — M6281 Muscle weakness (generalized): Secondary | ICD-10-CM

## 2022-04-13 DIAGNOSIS — R293 Abnormal posture: Secondary | ICD-10-CM
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
## 2022-04-26 ENCOUNTER — Ambulatory Visit: Payer: Medicare Other | Admitting: Physical Therapy

## 2022-04-26 ENCOUNTER — Telehealth: Payer: Self-pay | Admitting: Physical Therapy

## 2022-04-26 ENCOUNTER — Ambulatory Visit: Payer: Medicare Other | Attending: Neurosurgery | Admitting: Physical Therapy

## 2022-04-26 DIAGNOSIS — R293 Abnormal posture: Secondary | ICD-10-CM | POA: Insufficient documentation

## 2022-04-26 DIAGNOSIS — M25651 Stiffness of right hip, not elsewhere classified: Secondary | ICD-10-CM | POA: Insufficient documentation

## 2022-04-26 DIAGNOSIS — M6281 Muscle weakness (generalized): Secondary | ICD-10-CM | POA: Insufficient documentation

## 2022-04-26 DIAGNOSIS — R262 Difficulty in walking, not elsewhere classified: Secondary | ICD-10-CM | POA: Insufficient documentation

## 2022-04-26 DIAGNOSIS — M5459 Other low back pain: Secondary | ICD-10-CM | POA: Insufficient documentation

## 2022-04-26 DIAGNOSIS — R2681 Unsteadiness on feet: Secondary | ICD-10-CM | POA: Insufficient documentation

## 2022-04-26 DIAGNOSIS — M25652 Stiffness of left hip, not elsewhere classified: Secondary | ICD-10-CM | POA: Insufficient documentation

## 2022-04-26 DIAGNOSIS — R209 Unspecified disturbances of skin sensation: Secondary | ICD-10-CM | POA: Insufficient documentation

## 2022-04-26 NOTE — Telephone Encounter (Signed)
Called patient when she did not show up for her 9:45am appointment today. Left VM notifying her of the missed visit and of her next schedule visit 05/01/22 at [facility]. Graylon Good, PT, DPT 04/26/22, 10:10 AM  [facility] [address] [place], [place] [address] P: [phone] I F: [phone]

## 2022-04-27 ENCOUNTER — Ambulatory Visit: Payer: Medicare Other | Admitting: Physical Therapy

## 2022-04-27 ENCOUNTER — Telehealth: Payer: Self-pay | Admitting: Physical Therapy

## 2022-04-27 ENCOUNTER — Encounter: Payer: Self-pay | Admitting: Physical Therapy

## 2022-04-27 DIAGNOSIS — M5459 Other low back pain: Secondary | ICD-10-CM

## 2022-04-27 DIAGNOSIS — R293 Abnormal posture: Secondary | ICD-10-CM

## 2022-04-27 DIAGNOSIS — M25651 Stiffness of right hip, not elsewhere classified: Secondary | ICD-10-CM

## 2022-04-27 DIAGNOSIS — M6281 Muscle weakness (generalized): Secondary | ICD-10-CM

## 2022-04-27 DIAGNOSIS — R209 Unspecified disturbances of skin sensation: Secondary | ICD-10-CM

## 2022-04-27 DIAGNOSIS — R262 Difficulty in walking, not elsewhere classified: Secondary | ICD-10-CM

## 2022-04-27 DIAGNOSIS — R2681 Unsteadiness on feet: Secondary | ICD-10-CM | POA: Diagnosis present

## 2022-04-27 DIAGNOSIS — M25652 Stiffness of left hip, not elsewhere classified: Secondary | ICD-10-CM | POA: Diagnosis present

## 2022-04-27 IMAGING — CT CT ABD-PELV W/ CM
2 of 5 series · 17 of 46 positions shown, 19 images · IV contrast (APPLIED)
Comparison: None.

CLINICAL DATA: Acute abdominal pain and diarrhea.

EXAM:
CT ABDOMEN AND PELVIS WITH CONTRAST
TECHNIQUE: Multidetector CT imaging of the abdomen and pelvis was performed
using the standard protocol following bolus administration of
intravenous contrast.
CONTRAST:  100mL OMNIPAQUE IOHEXOL 300 MG/ML  SOLN

[Series 2: routine abd/pel with · axial · 0.98mm/px · z∈[-693,-283]mm · 14 of 94 slices shown, 16 images]
[im 6/94  soft-tissue]
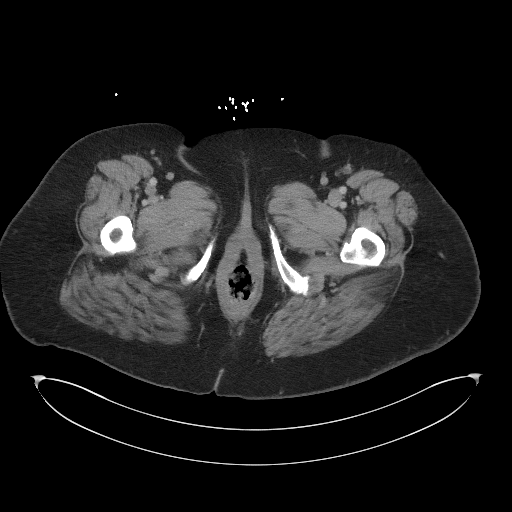
[im 6/94  bone]
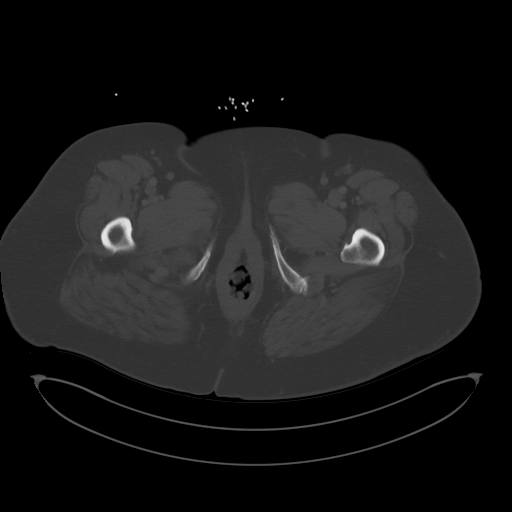
[im 11/94  soft-tissue]
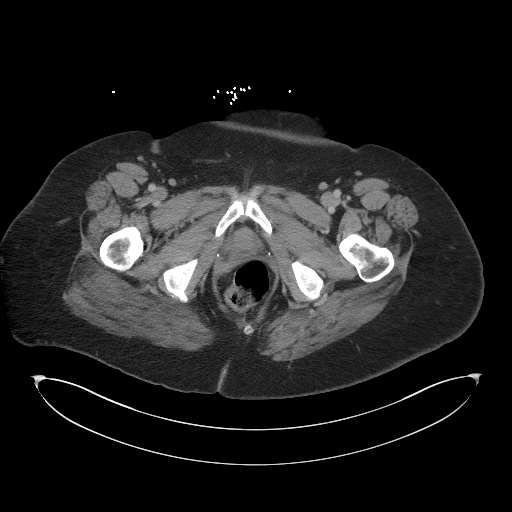
[im 17/94  soft-tissue]
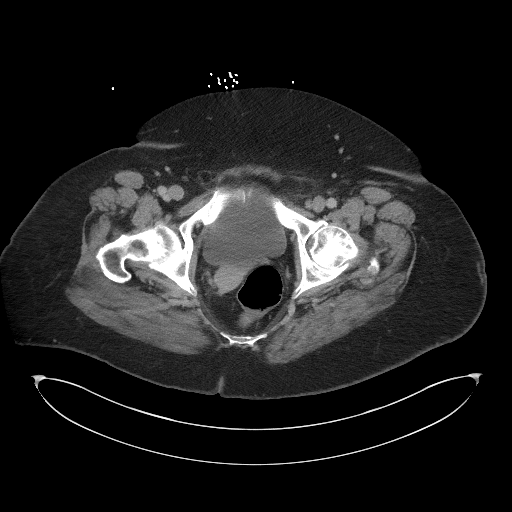
[im 28/94  soft-tissue]
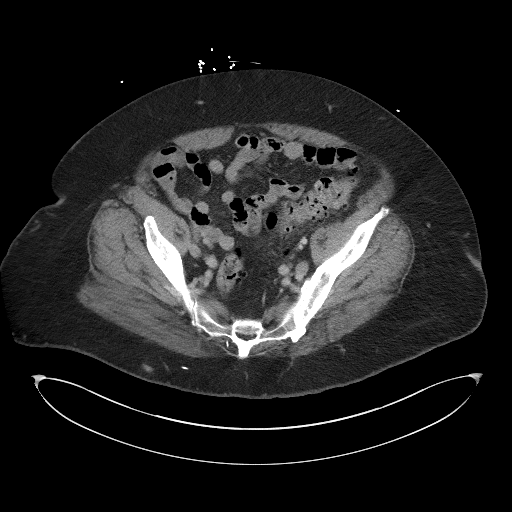
[im 33/94  soft-tissue]
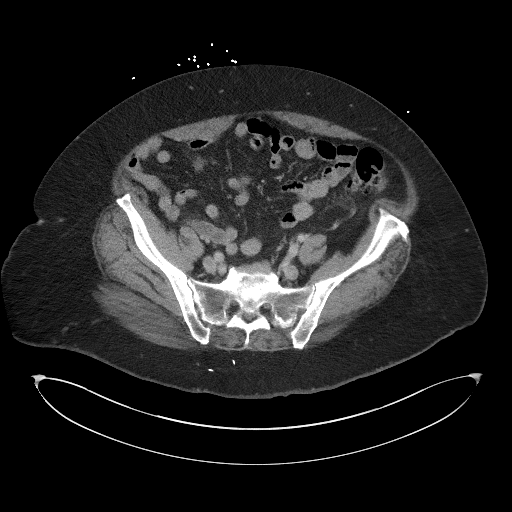
[im 39/94  soft-tissue]
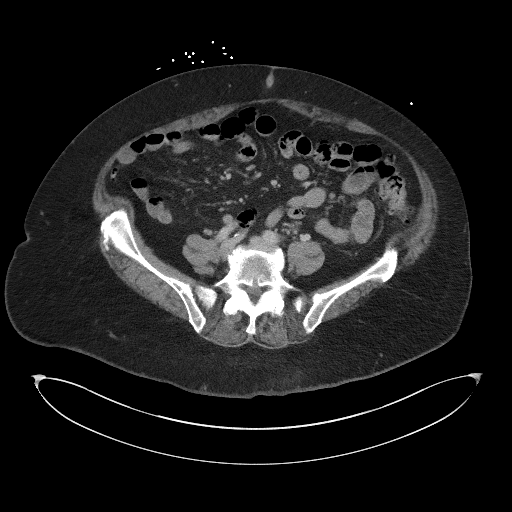
[im 44/94  soft-tissue]
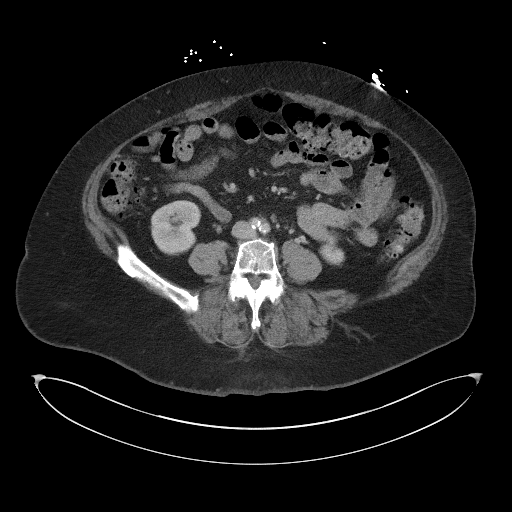
[im 50/94  soft-tissue]
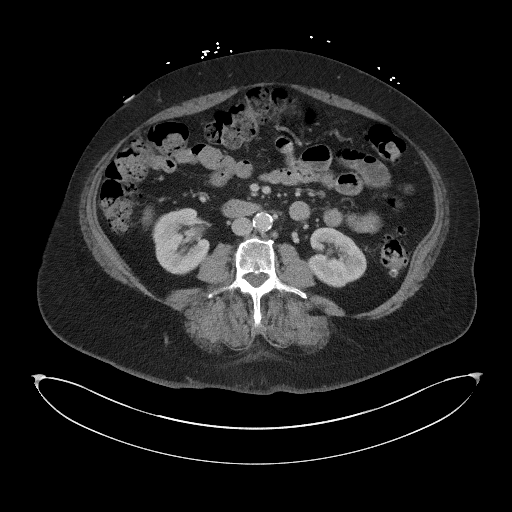
[im 55/94  soft-tissue]
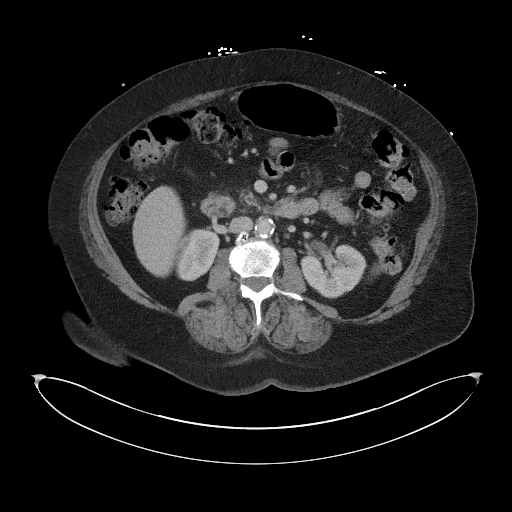
[im 55/94  bone]
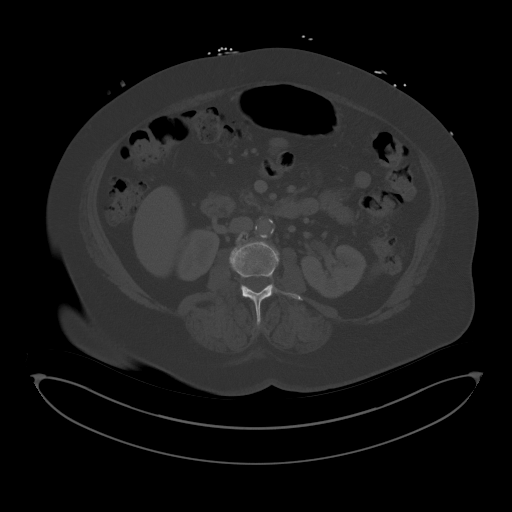
[im 61/94  soft-tissue]
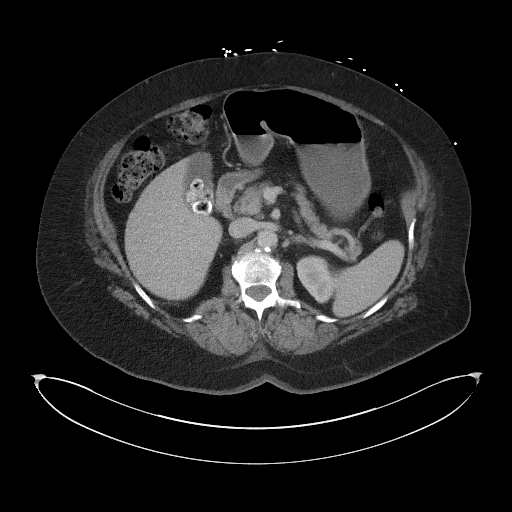
[im 72/94  soft-tissue]
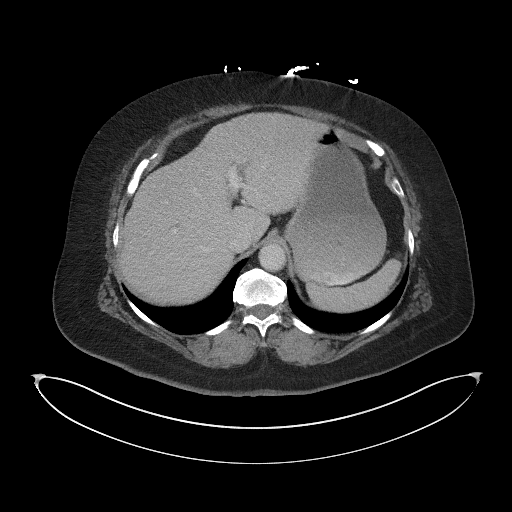
[im 77/94  soft-tissue]
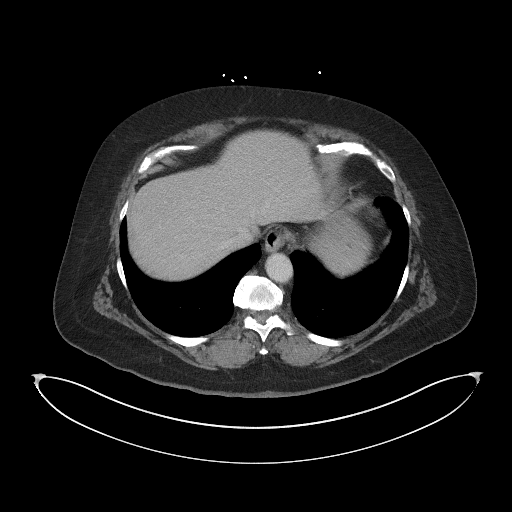
[im 83/94  soft-tissue]
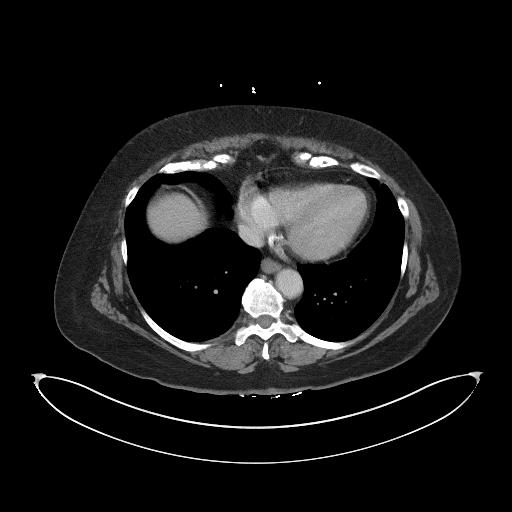
[im 88/94  soft-tissue]
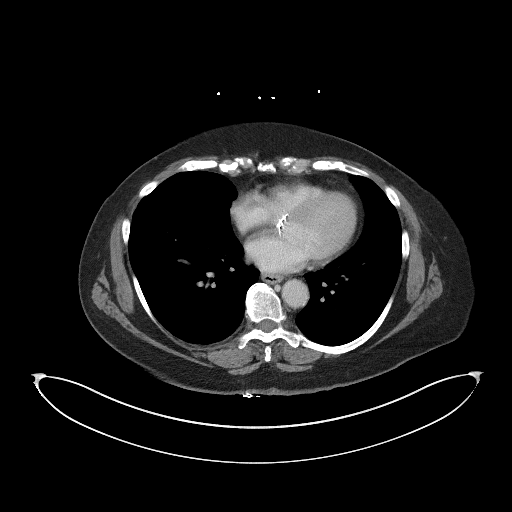

[Series 5: coronal st · coronal · 0.94mm/px · 3 of 117 slices shown]
[im 39/117  soft-tissue]
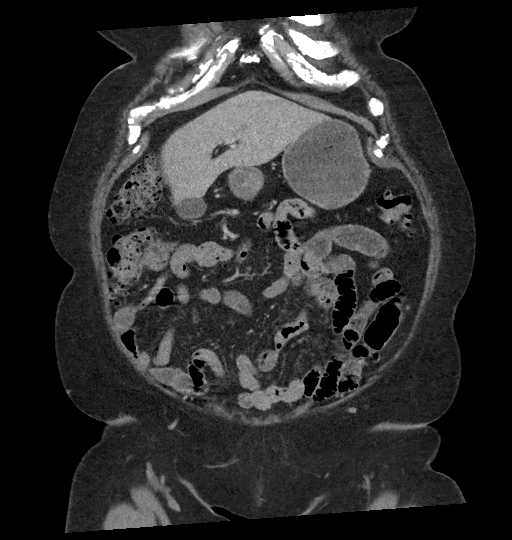
[im 52/117  soft-tissue]
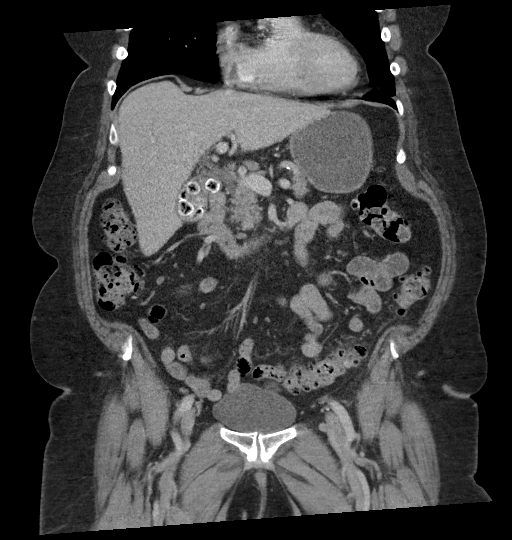
[im 65/117  soft-tissue]
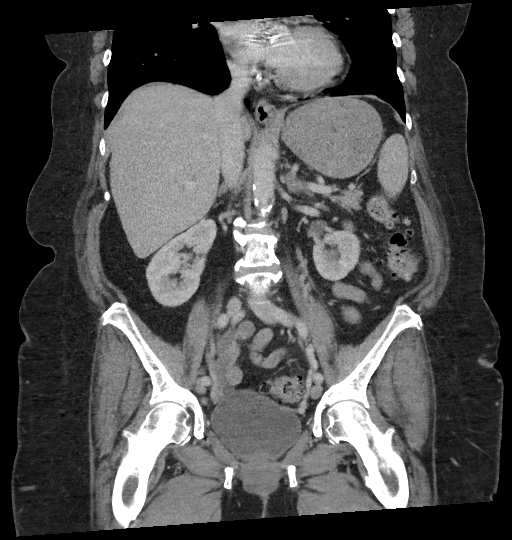

[17 of 46 positions shown; findings below may reference images not displayed]

FINDINGS: Lower Chest: No acute findings.

Hepatobiliary: No hepatic masses identified. Gallstones are seen,
however there is no evidence of cholecystitis or biliary dilatation.

Pancreas:  No mass or inflammatory changes.

Spleen: Within normal limits in size and appearance.

Adrenals/Urinary Tract: No masses identified. Small right renal cyst
noted. No evidence of ureteral calculi or hydronephrosis.

Stomach/Bowel: No evidence of obstruction, inflammatory process or
abnormal fluid collections. Diffuse colonic diverticulosis is seen
with most severe involvement of the descending and sigmoid colon.
The no signs of acute diverticulitis are seen. Normal appendix
visualized.

Vascular/Lymphatic: No pathologically enlarged lymph nodes. No acute
vascular findings. Aortic atherosclerotic calcification noted.

Reproductive:  No mass or other significant abnormality.

Other:  None.

Musculoskeletal:  No suspicious bone lesions identified.
IMPRESSION: Severe colonic diverticulosis. No definite evidence of
diverticulitis or other acute findings.

Cholelithiasis. No radiographic evidence of cholecystitis.

Aortic Atherosclerosis ([id number]).

## 2022-04-27 NOTE — Telephone Encounter (Signed)
Called patient back after she called to say she tested positive for COVID19 today after her PT appointment. Thanked patient for letting us know and gave her guidelines for return to the clinic:  today is day 0 because she first had symptoms today and had positive test today. She may return to clinic while wearing a mask after day 5 if her symptoms are better and she may return without a mask after Day 10 if her symptoms are better then. She agreed and said her daughter had called in the medication to help reduce severity. Asked office staff to cancel her appointment Monday 05/01/2022 and asked patient to call us if she does not feel up to coming to her appointment Thursday 05/04/2022.   Everlean Alstrom. Graylon Good, PT, DPT 04/27/22, 6:33 PM  [facility] [address] [place], [place] [address] P: [phone] I F: [phone]

## 2022-04-27 NOTE — Therapy (Signed)
OUTPATIENT PHYSICAL THERAPY TREATMENT NOTE   Patient Name: Brandy Oneill MRN: [id number] DOB:[birth year], 71 y.o., female Today's Date: 04/27/2022  PCP: Tracie Harrier, MD REFERRING PROVIDER: Meade Maw, MD  END OF SESSION:   PT End of Session - 04/27/22 1403     Visit Number 3    Number of Visits 24    Date for PT Re-Evaluation 07/04/22    Authorization Type MEDICARE PART B reporting period from 04/11/2022    Progress Note Due on Visit 10    PT Start Time 1350    PT Stop Time 1428    PT Time Calculation (min) 38 min    Activity Tolerance Patient tolerated treatment well    Behavior During Therapy [facility] for tasks assessed/performed             Past Medical History:  Diagnosis Date   Anxiety    Arthritis    CAD (coronary artery disease) 04/14/2020   a.) RHC 04/14/2020: mod CAD with sev obstructive Dz in large dom RCA; staged PCI planned. b.) PCI 04/28/2020: DES (unknown type) placed to dRCA.   Depression    Diastolic dysfunction 02/77/4128   a.) TTE 01/06/2019: EF >55%; triv panvalvular regurgitation; G1DD. b.) TTE 01/26/2020: EF > 55%; mod LVH; triv MR/TR/PR; G1DD.   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 b.) TTE 01/26/2020: EF >55%; sev AS (MPG 47.1 mmHg). c.) RHC 04/14/2020: norm CI, PVR, RAP; PCWP 10. d.) s/p TAVR 05/11/2020. e.) TTE 06/01/2020: EF >55%; mild LVH; MPG 9 mmHg. f.) TTE 06/16/2021: EF >55%; mild LVH; GLS -11.6%; MPG 24 mmHg.   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   a.) 23 mm Edwards Sapien S3 bioprosthetic valve   T2DM (type 2 diabetes mellitus) ([place])    Tubular adenoma of colon    Varicose veins of both legs with edema    Past Surgical History:  Procedure Laterality Date   bladder stimulater  2014   CARDIAC VALVE REPLACEMENT     CATARACT EXTRACTION W/ INTRAOCULAR LENS IMPLANT Bilateral 2014   one eye done then the other eye done a month later   [place] N/A 06/26/2018   Procedure: COLONOSCOPY WITH PROPOFOL;  Surgeon: Toledo, Benay Pike, MD;  Location: [facility] ENDOSCOPY;  Service: Gastroenterology;  Laterality: N/A;   EYE SURGERY Right 2018   macular hole   JOINT REPLACEMENT Right 10/27/2016   JOINT REPLACEMENT Left 2014   KNEE CLOSED REDUCTION Right 11/29/2016   Procedure: CLOSED MANIPULATION KNEE;  Surgeon: Leanor Kail, MD;  Location: [facility] ORS;  Service: Orthopedics;  Laterality: Right;   LUMBAR LAMINECTOMY/DECOMPRESSION MICRODISCECTOMY N/A 02/06/2022   Procedure: L3-5 DECOMPRESSION;  Surgeon: Meade Maw, MD;  Location: [facility] ORS;  Service: Neurosurgery;  Laterality: N/A;   Patient Active Problem List   Diagnosis Date Noted   Lumbar stenosis 02/06/2022   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   CAD (coronary artery disease) 04/28/2020  S/P angioplasty with stent 04/28/2020   Nonrheumatic aortic valve stenosis 03/12/2020   Essential hypertension 02/06/2019   Hyperlipidemia 02/06/2019   Spinal stenosis of lumbar region with radiculopathy 09/27/2018   BMI 38.0-38.9,adult 09/26/2018   Back pain 09/05/2018   Diet-controlled type 2 diabetes mellitus ([place]) 03/27/2018   Postmenopausal 03/27/2018   DJD (degenerative joint disease) 02/04/2018   History of angioedema 09/17/2017   Hyperglycemia 09/17/2017   Lymphedema 02/04/2017   Chronic venous insufficiency 02/04/2017   Swelling of limb 02/04/2017   Pure hypercholesterolemia 10/02/2016   History of  total knee arthroplasty 09/20/2016   Varicose veins of both legs with edema 03/25/2016   Abnormal mammogram 06/01/2015   Urinary incontinence in female 06/01/2015   Prediabetes 06/16/2014   Anxiety and depression 01/28/2014   Psoriasis 01/28/2014    REFERRING DIAG: spinal stenosis of lumbar region with neurogenic claudication, imbalance  THERAPY DIAG:  Other low back pain  Unspecified disturbances of skin sensation  Difficulty in walking, not elsewhere classified  Unsteadiness on feet  Muscle weakness (generalized)  Stiffness of right hip, not elsewhere classified  Stiffness of left hip, not elsewhere classified  Abnormal posture  Rationale for Evaluation and Treatment: Rehabilitation  PERTINENT HISTORY: Patient is a 71 y.o. female who presents to outpatient physical therapy with a referral for medical diagnosis spinal stenosis of lumbar region with neurogenic claudication, imbalance. This patient's chief complaints consist of numbness and weakness in her B lower legs following L3-5 lumbar decompression including central laminectomy and bilateral medial facetectomies including foraminotomies on  02/06/22 and difficulty with mobility and balance leading to the following functional deficits: difficulty with anything that requires walking, standing, and/or upright balance including shopping, carrying, grocery shopping, navigating stairs, social outings, housework, yardwork, bathing and dressing. Relevant past medical history and comorbidities include anxiety, arthritis, CAD with stents placed (monitored 1x a year by GP), depression, diastolic dysfunction, diverticulosis, hereditary lymphedema of B legs, history of angioedema, hyperlipidemia, HTN, lumbar spinal stenosis, s/P TAVR (05/11/2020), T2DM, Tubular adenoma of colon, varicose veins of both legs with edema, bladder stimulator (after bladder damaged from attempts to break up kidney stones per pt), bilateral cataract surgery, B knee  replacements (R 2018 with manipulation, L 2014), lumbar laminectomy/decompression/microdiscectomy at L3-5 (02/06/2022), urinary incontinence, former smoker.  Patient denies hx of cancer, stroke, seizures, lung problems, unexplained weight loss, and osteoporosis. She endorses worsening urinary incontinence since back surgery; has a history of bladder damage that made it hard for her to get to control her urine once she felt urgency; now, she does not feel urgency since having her back surgery. She has a bladder stimulator that is not working since right before [employer].   PRECAUTIONS: do not lift anything over 25#, fall.   SUBJECTIVE: Patient arrives with [facility] She states she feels awful today with her joints aching. She states she has days like this. She denies sore throat or viral symptoms. She feels "funny" not dizzy. She accidentally locked herself out of the front door last night and had to improvise to get to the back door by using a dolly as a walker. She states she is "about to give up." She reports pain in bilateral shoulders of 6/10. She got back from her trip on Tuesday night and was very tired. She did not get good sleep on her trip. She fell when she was leaning over on the bed and leaned too far and slid off the bed. She could not get up by herself and eventually got  help to get up. She is tired of being dependent on people. She states she did some of her HEP and she is still wobbly.   PAIN:  Are you having pain? Yes 6/10 in bilateral UE.   OBJECTIVE  TODAY'S TREATMENT  Neuromuscular Re-education: to improve, balance, postural strength, muscle activation patterns, and stabilization strength required for functional activities: - static standing self selected stance on airex, eyes open, 1x1 min, UE support available, SBA - static standing self selected stance on airex, eyes closed, 1x1 min, intermittent UE support. SBA - static standing, self selected stance on airex, eyes open with  head turns, 2x20 each direction/ intermittent UE support, SBA.  - lateral stepping on airex beam, 2x5 each direction (pt moves approx 3 feet each direction). CGA and UE support as needed.  - standing alternating tapping on 8.5 inch surface with B UE support attempting to progress to looking up and lessening UE support,  SBA. 3x10 each side.  - standing cone tip/right with SPC and CGA, 1x10 each side. Seated rest between.  - sit <> stand from 18 inch (green) chair with ball toss to/from PT using lightweight pink theraball. 1x10. Patient forgetting sequencing at times.  - sit <> stand with 3kg ball drop/slam and retrieval, 1x10 with cuing for sequencing and min A at one point to prevent fall with LOB forwards.   Pt required multimodal cuing for proper technique and to facilitate improved neuromuscular control, strength, range of motion, and functional ability resulting in improved performance and form.     PATIENT EDUCATION:  Education details: Exercise purpose/form. Self management techniques. Education on HEP including handout  Person educated: Patient Education method: Explanation, demonstration, verbal cuing Education comprehension: verbalized and demonstrated understanding and needs further education     HOME EXERCISE PROGRAM: Access Code: [email or web address] URL: [email or web address] Date: 04/13/2022 Prepared by: Rosita Kea  Exercises - Tandem Stance  - 1 x daily - 2 sets - 1 minute hold - Corner Balance Feet Together With Eyes Closed  - 1 x daily - 2 sets - 1 min hold - Corner Balance Feet Together: Eyes Open With Head Turns  - 1 x daily - 2 sets - 20 reps - Sit to Stand Without Arm Support  - 1 x daily - 3 sets - 10 reps   ASSESSMENT:   CLINICAL IMPRESSION: Patient returns to PT after being away on a trip. She continues to have quick fatigue with standing exercises and crouched posture. She requires seated breaks throughout session and complains of tingling in her lower  legs developing during session. She is unsteady on feet and needs UE support for  balance during ambulation. Plan to continue working on standing tolerance and balance next session. Patient would benefit from continued management of limiting condition by skilled physical therapist to address remaining impairments and functional limitations to work towards stated goals and return to PLOF or maximal functional independence.    Patient is a 71 y.o. female referred to outpatient physical therapy with a medical diagnosis of spinal stenosis of lumbar region with neurogenic claudication and imbalance who presents with signs and symptoms consistent with unsteadiness on feet, loss of sensation, and difficulty with strength and motor control of B lower legs as well as hip and back stiffness with weakness in right dorsiflexion and great toe extension. She also reported increased urinary incontinence since surgery where she leaks without urge, so she has been wearing diapers. Lower leg symptoms appear similar to that expected from peripheral neuropathy.  R ankle/foot weakness suggests possible former or current compromise of L4 or L5 nerve root, but could also be related to neuropathy symptoms. Patient denies weakness or numbness in B lower legs prior to recent back surgery and and chart review lacks prior record of involvement of patient's lower legs, ankles, or feet.  Patient's symptoms were unable to be worsened or relieved with exam today and PT was unable to establish a direct link between lumbar spine and lower leg numbness/weakness. Patient is also suffering from significant stooped posture with lack of hip and back extension ROM contributing. This is likely related to having difficulty with maintaining standing postures for a prolonged period related to spinal stenosis and back pain in standing postures. Patient presents with significant pain, ROM, sensation, balance, motor control, posture, joint stiffness, gait,  muscle tension, muscle performance (strength/power/endurance) and activity tolerance impairments that are limiting ability to complete her usual activities including anything that requires walking, standing, and/or upright balance including shopping, carrying, grocery shopping, navigating stairs, social outings, housework, yardwork, bathing and dressing without difficulty. Patient will benefit from skilled physical therapy intervention to address current body structure impairments and activity limitations to improve function and work towards goals set in current POC in order to return to prior level of function or maximal functional improvement.      OBJECTIVE IMPAIRMENTS Abnormal gait, decreased activity tolerance, decreased balance, decreased coordination, decreased endurance, decreased knowledge of condition, decreased knowledge of use of DME, decreased mobility, difficulty walking, decreased ROM, decreased strength, dizziness, hypomobility, increased edema, impaired perceived functional ability, impaired flexibility, impaired sensation, improper body mechanics, postural dysfunction, obesity, and pain.    ACTIVITY LIMITATIONS carrying, lifting, standing, squatting, stairs, transfers, bed mobility, continence, bathing, dressing, reach over head, locomotion level, and caring for others   PARTICIPATION LIMITATIONS: meal prep, cleaning, laundry, interpersonal relationship, shopping, community activity, yard work, and   anything that requires walking, standing, and/or upright balance including shopping, carrying, grocery shopping, navigating stairs, social outings, housework, yardwork, bathing and dressing, gardening, going to the movies.    PERSONAL FACTORS Age, Fitness, Time since onset of injury/illness/exacerbation, and 3+ comorbidities:   anxiety, arthritis, CAD with stents placed (monitored 1x a year by GP), depression, diastolic dysfunction, diverticulosis, hereditary lymphedema of B legs, history of  angioedema, hyperlipidemia, HTN, lumbar spinal stenosis, s/P TAVR (05/11/2020), T2DM, Tubular adenoma of colon, varicose veins of both legs with edema, bladder stimulator (after bladder damaged from attempts to break up kidney stones per pt), bilateral cataract surgery, B knee replacements (R 2018 with manipulation, L 2014), lumbar laminectomy/decompression/microdiscectomy at L3-5 (02/06/2022), urinary incontinence, former smoker are also affecting patient's functional outcome.    REHAB POTENTIAL: Good   CLINICAL DECISION MAKING: Evolving/moderate complexity   EVALUATION COMPLEXITY: Moderate     GOALS: Goals reviewed with patient? No   SHORT TERM GOALS: Target date: 04/25/2022   Patient will be independent with initial home exercise program for self-management of symptoms. Baseline: Initial HEP to be provided at visit 2 as appropriate (04/11/22); initial HEP provided at visit 2 (04/13/2022);  Goal status: In-progress     LONG TERM GOALS: Target date: 07/04/2022   Patient will be independent with a long-term home exercise program for self-management of symptoms.  Baseline: Initial HEP to be provided at visit 2 as appropriate (04/11/22); initial HEP provided at visit 2 (04/13/2022);  Goal status: In-progress   2.  Patient will demonstrate improved FOTO by equal or greater than 10 points to demonstrate improvement in overall condition and self-reported  functional ability.  Baseline: to be measured visit 2 as appropriate (04/11/22); 40 at visit #2 (04/13/2022);  Goal status: In-progress   3.  Patient will ambulate equal or greater than 1000 feet on 6 Minute Walk Test with no AD to demonstrate improved ability to safely complete household and community ambulation without falls.  Baseline: to be tested visit 2 as appropriate (04/11/22); 710 feet with RW and mildly stooped posture, flexed knees. Reports her B LE felt less numb during last part of test. Reported B UE fatigue (04/13/2022);  Goal  status: In-progress   4.  Patient will demonstrate the ability to stand in tandem stance for equal or greater than 30 seconds on each side to demonstrate improved static balance for tasks such as cooking and gardening.  Baseline:  R = 16 seconds, L = 5 seconds  (04/11/22); Goal status: In-progress   5.  Patient will complete community, work and/or recreational activities without limitation due to current condition.  Baseline: difficulty with anything that requires walking, standing, and/or upright balance including shopping, carrying, grocery shopping, navigating stairs, social outings, housework, yardwork, bathing and dressing, walking on uneven ground (04/11/22); Goal status: In-progress       PLAN: PT FREQUENCY: 1-2x/week   PT DURATION: 12 weeks   PLANNED INTERVENTIONS: Therapeutic exercises, Therapeutic activity, Neuromuscular re-education, Balance training, Gait training, Patient/Family education, Joint mobilization, Stair training, DME instructions, Aquatic Therapy, Dry Needling, Electrical stimulation, Spinal mobilization, Cryotherapy, Moist heat, Manual therapy, and Re-evaluation.   PLAN FOR NEXT SESSION: update HEP as appropriate, progressive balance and functional/LE strengthening as appropriate.    Nancy Nordmann, PT, DPT 04/27/2022, 2:30 PM  [facility] [address] [place], [place] [address] P: [phone] I F: [phone]

## 2022-05-01 ENCOUNTER — Ambulatory Visit: Payer: Medicare Other | Admitting: Physical Therapy

## 2022-05-01 IMAGING — MG MM DIGITAL SCREENING BILAT W/ TOMO AND CAD
8 series · 8 of 24 positions shown · non-contrast
Comparison: Previous exam(s).

CLINICAL DATA: Screening.

EXAM:
DIGITAL SCREENING BILATERAL MAMMOGRAM WITH TOMOSYNTHESIS AND CAD
TECHNIQUE: Bilateral screening digital craniocaudal and mediolateral oblique
mammograms were obtained. Bilateral screening digital breast
tomosynthesis was performed. The images were evaluated with
computer-aided detection.

[L CC synth-2D]
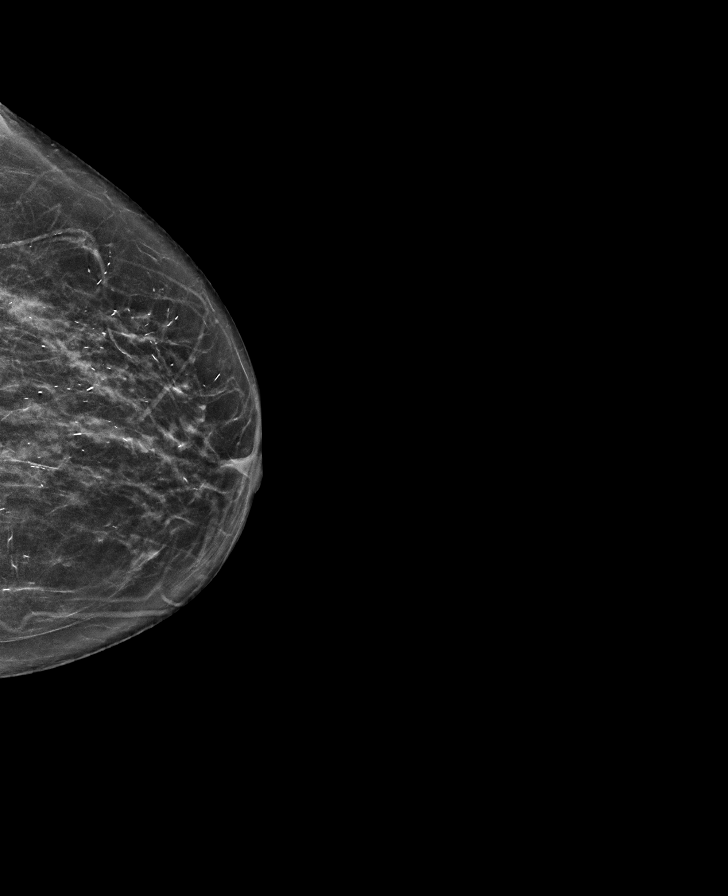

[L MLO synth-2D]
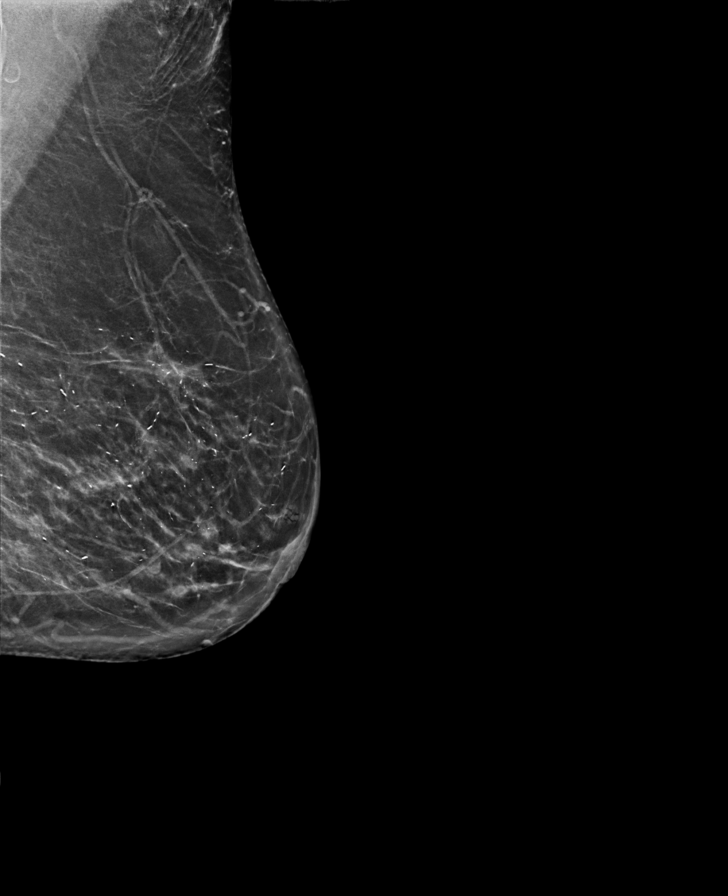

[R MLO synth-2D]
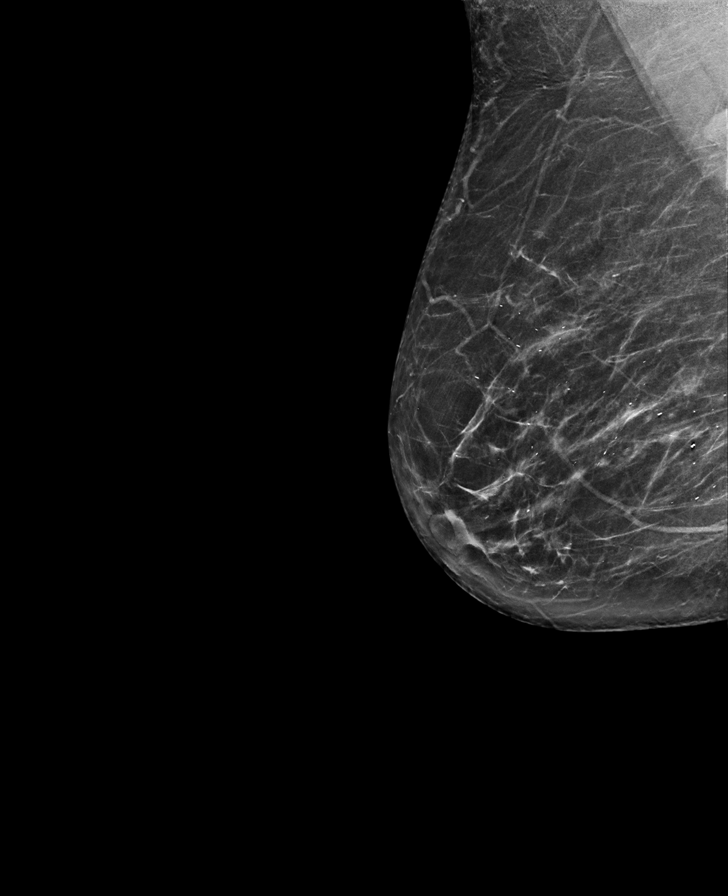

[R CC synth-2D]
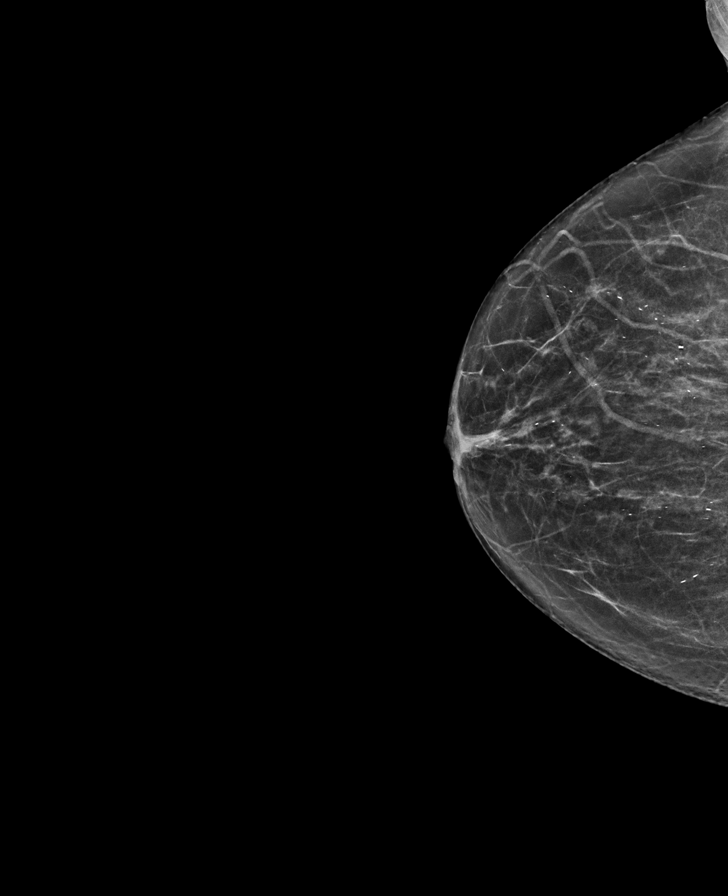

[L MLO tomo · tomo slice 36/71.0]
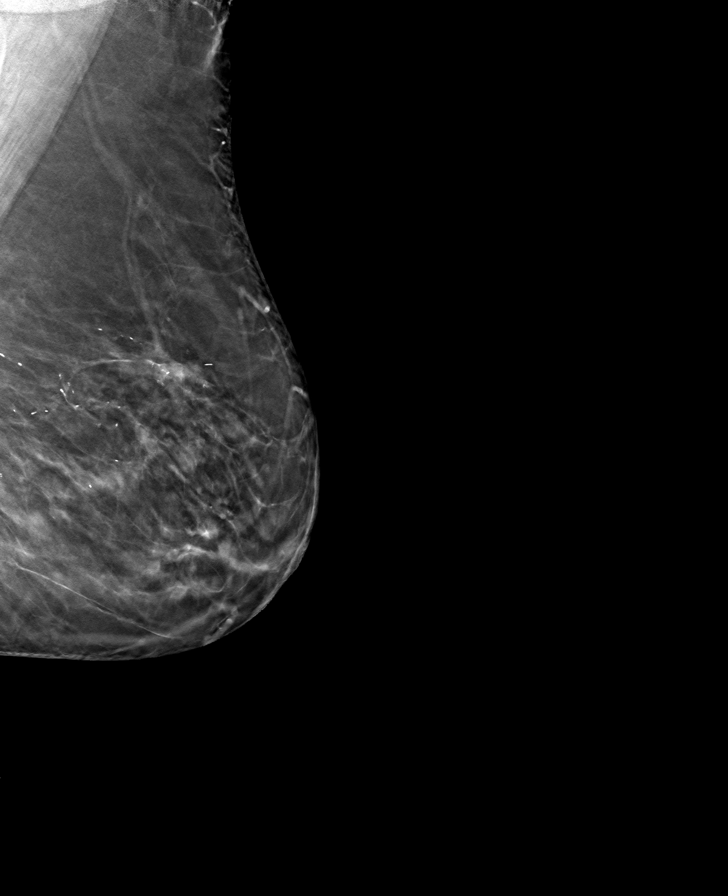

[R MLO tomo · tomo slice 37/73.0]
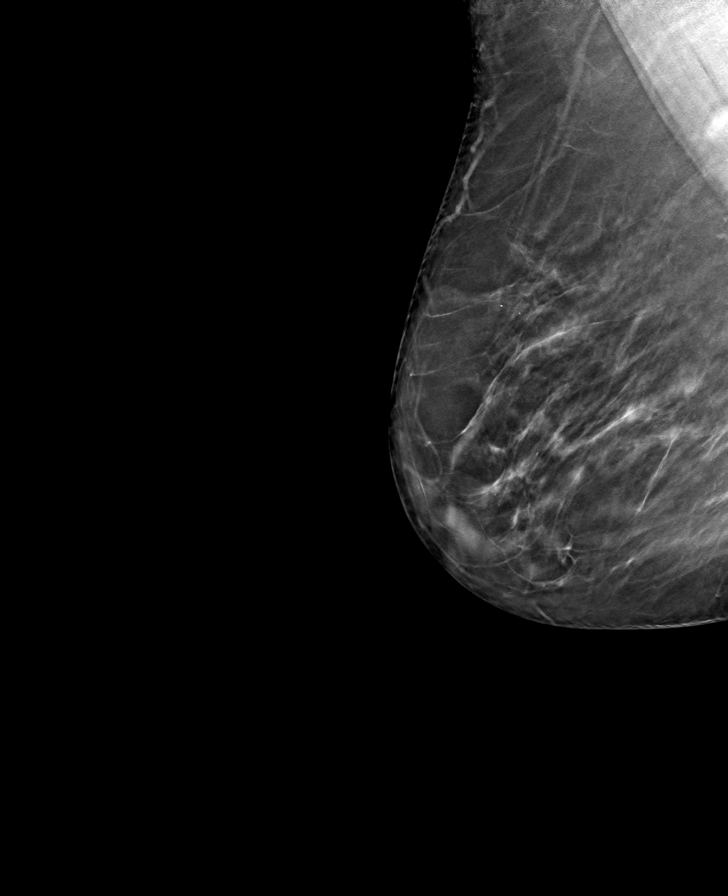

[L CC tomo · tomo slice 31/62.0]
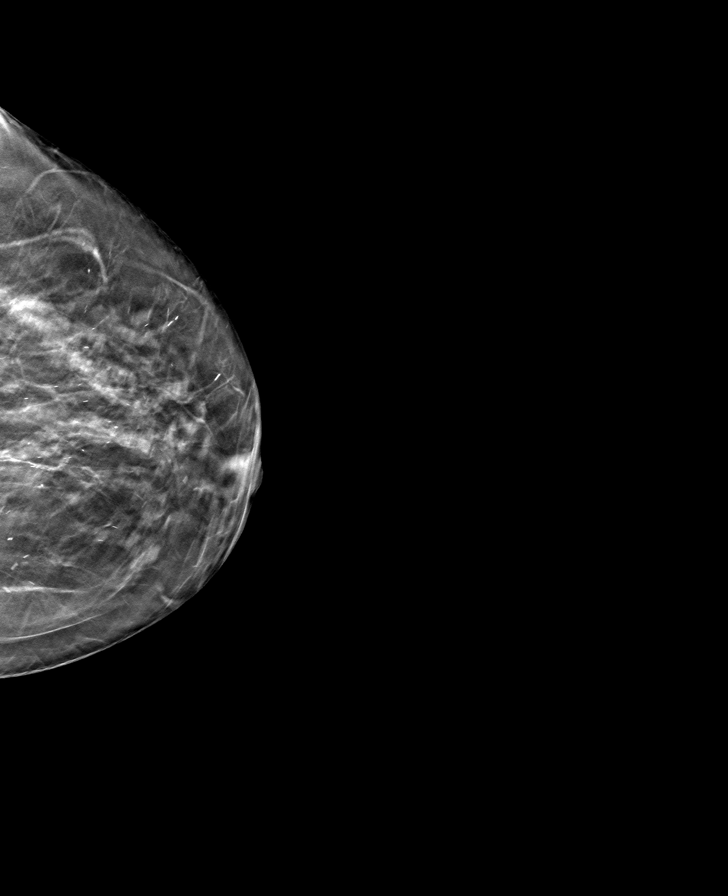

[R CC tomo · tomo slice 30/59.0]
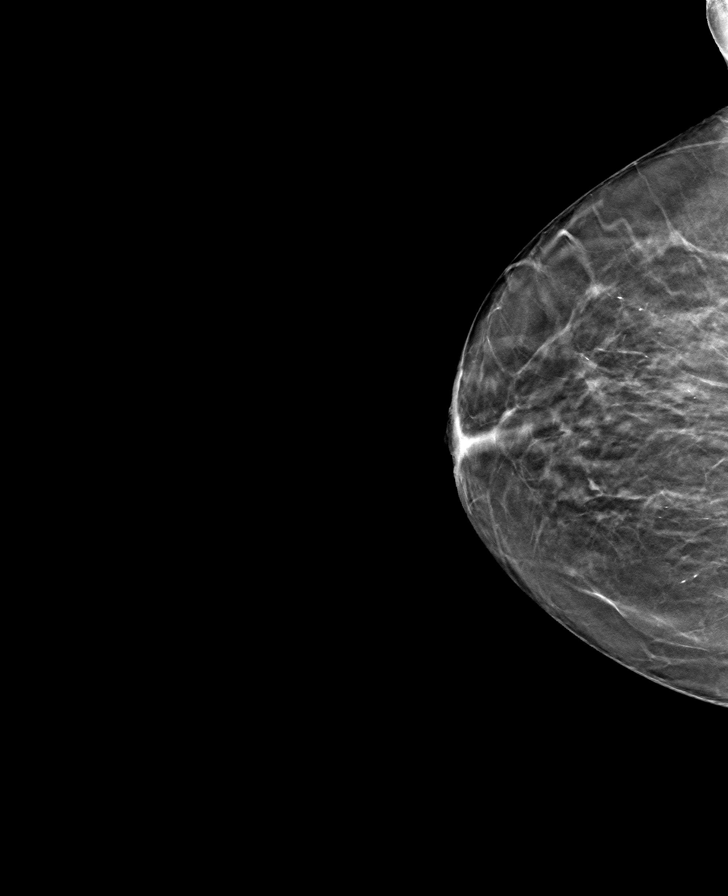

[8 of 24 positions shown; findings below may reference images not displayed]

ACR Breast Density Category b: There are scattered areas of
fibroglandular density.
FINDINGS: In the right breast, a possible mass with distortion warrants
further evaluation. This possible small mass with distortion is seen
within the outer RIGHT breast, cc view only, slice 34.

In the left breast, no findings suspicious for malignancy.
IMPRESSION: Further evaluation is suggested for a possible mass in the right
breast.

RECOMMENDATION:
Diagnostic mammogram and possibly ultrasound of the right breast.
(Code:[id number])

The patient will be contacted regarding the findings, and additional
imaging will be scheduled.

BI-RADS CATEGORY  0: Incomplete. Need additional imaging evaluation
and/or prior mammograms for comparison.

## 2022-05-04 ENCOUNTER — Ambulatory Visit: Payer: Medicare Other | Admitting: Physical Therapy

## 2022-05-08 ENCOUNTER — Ambulatory Visit: Payer: Medicare Other | Admitting: Physical Therapy

## 2022-05-08 ENCOUNTER — Encounter: Payer: Self-pay | Admitting: Physical Therapy

## 2022-05-08 VITALS — BP 133/55 | HR 95

## 2022-05-08 DIAGNOSIS — M5459 Other low back pain: Secondary | ICD-10-CM | POA: Diagnosis not present

## 2022-05-08 DIAGNOSIS — R209 Unspecified disturbances of skin sensation: Secondary | ICD-10-CM

## 2022-05-08 DIAGNOSIS — M25651 Stiffness of right hip, not elsewhere classified: Secondary | ICD-10-CM

## 2022-05-08 DIAGNOSIS — R262 Difficulty in walking, not elsewhere classified: Secondary | ICD-10-CM

## 2022-05-08 DIAGNOSIS — M6281 Muscle weakness (generalized): Secondary | ICD-10-CM

## 2022-05-08 DIAGNOSIS — R2681 Unsteadiness on feet: Secondary | ICD-10-CM

## 2022-05-08 NOTE — Therapy (Signed)
OUTPATIENT PHYSICAL THERAPY TREATMENT NOTE   Patient Name: Brandy Oneill MRN: [id number] DOB:1950-10-31, 71 y.o., female Today's Date: 05/08/2022  PCP: Tracie Harrier, MD REFERRING PROVIDER: Meade Maw, MD  END OF SESSION:   PT End of Session - 05/08/22 1738     Visit Number 4    Number of Visits 24    Date for PT Re-Evaluation 07/04/22    Authorization Type MEDICARE PART B reporting period from 04/11/2022    Progress Note Due on Visit 10    PT Start Time 1735    PT Stop Time 1813    PT Time Calculation (min) 38 min    Activity Tolerance Patient tolerated treatment well;Patient limited by fatigue    Behavior During Therapy [facility] for tasks assessed/performed             Past Medical History:  Diagnosis Date   Anxiety    Arthritis    CAD (coronary artery disease) 04/14/2020   a.) RHC 04/14/2020: mod CAD with sev obstructive Dz in large dom RCA; staged PCI planned. b.) PCI 04/28/2020: DES (unknown type) placed to dRCA.   Depression    Diastolic dysfunction 45/36/4680   a.) TTE 01/06/2019: EF >55%; triv panvalvular regurgitation; G1DD. b.) TTE 01/26/2020: EF > 55%; mod LVH; triv MR/TR/PR; G1DD.   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 b.) TTE 01/26/2020: EF >55%; sev AS (MPG 47.1 mmHg). c.) RHC 04/14/2020: norm CI, PVR, RAP; PCWP 10. d.) s/p TAVR 05/11/2020. e.) TTE 06/01/2020: EF >55%; mild LVH; MPG 9 mmHg. f.) TTE 06/16/2021: EF >55%; mild LVH; GLS -11.6%; MPG 24 mmHg.   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   a.) 23 mm Edwards Sapien S3 bioprosthetic valve   T2DM (type 2 diabetes mellitus) ([place])    Tubular adenoma of colon    Varicose veins of both legs with edema    Past Surgical History:  Procedure Laterality Date   bladder stimulater  2014   CARDIAC VALVE REPLACEMENT     CATARACT EXTRACTION W/ INTRAOCULAR LENS IMPLANT Bilateral 2014   one eye done then the other eye done a month later   [place] N/A 06/26/2018   Procedure: COLONOSCOPY WITH PROPOFOL;  Surgeon: Tol[place], Benay Pike, MD;  Location: [facility] ENDOSCOPY;  Service: Gastroenterology;  Laterality: N/A;   EYE SURGERY Right 2018   macular hole   JOINT REPLACEMENT Right 10/27/2016   JOINT REPLACEMENT Left 2014   KNEE CLOSED REDUCTION Right 11/29/2016   Procedure: CLOSED MANIPULATION KNEE;  Surgeon: Leanor Kail, MD;  Location: [facility] ORS;  Service: Orthopedics;  Laterality: Right;   LUMBAR LAMINECTOMY/DECOMPRESSION MICRODISCECTOMY N/A 02/06/2022   Procedure: L3-5 DECOMPRESSION;  Surgeon: Meade Maw, MD;  Location: [facility] ORS;  Service: Neurosurgery;  Laterality: N/A;   Patient Active Problem List   Diagnosis Date Noted   Lumbar stenosis 02/06/2022   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   CAD (coronary  artery disease) 04/28/2020   S/P angioplasty with stent 04/28/2020   Nonrheumatic aortic valve stenosis 03/12/2020   Essential hypertension 02/06/2019   Hyperlipidemia 02/06/2019   Spinal stenosis of lumbar region with radiculopathy 09/27/2018   BMI 38.0-38.9,adult 09/26/2018   Back pain 09/05/2018   Diet-controlled type 2 diabetes mellitus ([place]) 03/27/2018   Postmenopausal 03/27/2018   DJD (degenerative joint disease) 02/04/2018   History of angioedema 09/17/2017   Hyperglycemia 09/17/2017   Lymphedema 02/04/2017   Chronic venous insufficiency 02/04/2017   Swelling of limb 02/04/2017   Pure  hypercholesterolemia 10/02/2016   History of total knee arthroplasty 09/20/2016   Varicose veins of both legs with edema 03/25/2016   Abnormal mammogram 06/01/2015   Urinary incontinence in female 06/01/2015   Prediabetes 06/16/2014   Anxiety and depression 01/28/2014   Psoriasis 01/28/2014    REFERRING DIAG: spinal stenosis of lumbar region with neurogenic claudication, imbalance  THERAPY DIAG:  Other low back pain  Unspecified disturbances of skin sensation  Difficulty in walking, not elsewhere classified  Unsteadiness on feet  Muscle weakness (generalized)  Stiffness of right hip, not elsewhere classified  Rationale for Evaluation and Treatment: Rehabilitation  PERTINENT HISTORY: Patient is a 71 y.o. female who presents to outpatient physical therapy with a referral for medical diagnosis spinal stenosis of lumbar region with neurogenic claudication, imbalance. This patient's chief complaints consist of numbness and weakness in her B lower legs following L3-5 lumbar decompression including central laminectomy and bilateral medial facetectomies including foraminotomies on  02/06/22 and difficulty with mobility and balance leading to the following functional deficits: difficulty with anything that requires walking, standing, and/or upright balance including shopping, carrying, grocery shopping, navigating stairs, social outings, housework, yardwork, bathing and dressing. Relevant past medical history and comorbidities include anxiety, arthritis, CAD with stents placed (monitored 1x a year by GP), depression, diastolic dysfunction, diverticulosis, hereditary lymphedema of B legs, history of angioedema, hyperlipidemia, HTN, lumbar spinal stenosis, s/P TAVR (05/11/2020), T2DM, Tubular adenoma of colon, varicose veins of both legs with edema, bladder stimulator (after bladder damaged from attempts to break up kidney stones per pt), bilateral cataract surgery, B knee replacements (R 2018 with  manipulation, L 2014), lumbar laminectomy/decompression/microdiscectomy at L3-5 (02/06/2022), urinary incontinence, former smoker.  Patient denies hx of cancer, stroke, seizures, lung problems, unexplained weight loss, and osteoporosis. She endorses worsening urinary incontinence since back surgery; has a history of bladder damage that made it hard for her to get to control her urine once she felt urgency; now, she does not feel urgency since having her back surgery. She has a bladder stimulator that is not working since right before [employer].   PRECAUTIONS: do not lift anything over 25#, fall.   SUBJECTIVE: Patient reports she is feeling better today after being sick with [place]. She tested positive/first had symptoms on 04/27/2022 and missed her PT appointments last week due to illness. She states she had congestion and a lot of fatigue. She continues to have trouble with balance and walking activities. She feels unsteady when she turns quickly. She arrives with [facility] and wearing KN95 mask.   PAIN:  Are you having pain? Numbness in bilateral lower legs.   OBJECTIVE  Vitals:   05/08/22 1739  BP: (!) 133/55  Pulse: 95  SpO2: 97%    TODAY'S TREATMENT  Therapeutic exercise: to centralize symptoms and improve ROM, strength, muscular endurance, and activity tolerance required for successful completion of functional activities.  - Vitals measurement for safety prior to exercise (see above).  -  NuStep level 1 using bilateral upper and lower extremities. Seat/handle setting 7/7. For improved extremity mobility, muscular endurance, and activity tolerance; and to induce the analgesic effect of aerobic exercise, stimulate improved joint nutrition, and prepare body structures and systems for following interventions. x 6  minutes. Average SPM = 66.  Neuromuscular Re-education: to improve, balance, postural strength, muscle activation patterns, and stabilization strength required for functional  activities: - static standing, self selected stance on airex, eyes open with head turns, 3x20 each direction/ intermittent UE support, SBA.  - static standing self selected stance on airex, eyes closed, 3x1 min, intermittent UE support. SBA. - lateral stepping on airex beam (5 feet), 2x5 each direction, CGA and UE support as needed.  - lateral stepping over 6 inch hurdle, 1x10 each direction with CGA and UE support.  - standing cone tip/right with U UE support and CGA, 1x10 each side.  Pt required multimodal cuing for proper technique and to facilitate improved neuromuscular control, strength, range of motion, and functional ability resulting in improved performance and form.     PATIENT EDUCATION:  Education details: Exercise purpose/form. Self management techniques. Education on HEP including handout  Person educated: Patient Education method: Explanation, demonstration, verbal cuing Education comprehension: verbalized and demonstrated understanding and needs further education     HOME EXERCISE PROGRAM: Access Code: [email or web address] URL: [email or web address] Date: 04/13/2022 Prepared by: Rosita Kea  Exercises - Tandem Stance  - 1 x daily - 2 sets - 1 minute hold - Corner Balance Feet Together With Eyes Closed  - 1 x daily - 2 sets - 1 min hold - Corner Balance Feet Together: Eyes Open With Head Turns  - 1 x daily - 2 sets - 20 reps - Sit to Stand Without Arm Support  - 1 x daily - 3 sets - 10 reps   ASSESSMENT:   CLINICAL IMPRESSION: Patient returns to PT after being ill with COVID19. She was quckly fatigued and had increased perspiration during session. SpO2 and HR remained WFL during session and BP WFL with no reported symptoms of lightheadedness when checked. Patient is very unsteady on feet and continues to complain of paresthesia in B lower legs. Plan to continue working on balance as tolerated. Patient would benefit from continued management of limiting condition by  skilled physical therapist to address remaining impairments and functional limitations to work towards stated goals and return to PLOF or maximal functional independence.   From PT eval 04/11/2022:  Patient is a 72 y.o. female referred to outpatient physical therapy with a medical diagnosis of spinal stenosis of lumbar region with neurogenic claudication and imbalance who presents with signs and symptoms consistent with unsteadiness on feet, loss of sensation, and difficulty with strength and motor control of B lower legs as well as hip and back stiffness with weakness in right dorsiflexion and great toe extension. She also reported increased urinary incontinence since surgery where she leaks without urge, so she has been wearing diapers. Lower leg symptoms appear similar to that expected from peripheral neuropathy. R ankle/foot weakness suggests possible former or current compromise of L4 or L5 nerve root, but could also be related to neuropathy symptoms. Patient denies weakness or numbness in B lower legs prior to recent back surgery and and chart review lacks prior record of involvement of patient's lower legs, ankles, or feet.  Patient's symptoms were unable to be worsened or relieved with exam today and PT was unable to establish a direct link between lumbar spine and lower  leg numbness/weakness. Patient is also suffering from significant stooped posture with lack of hip and back extension ROM contributing. This is likely related to having difficulty with maintaining standing postures for a prolonged period related to spinal stenosis and back pain in standing postures. Patient presents with significant pain, ROM, sensation, balance, motor control, posture, joint stiffness, gait, muscle tension, muscle performance (strength/power/endurance) and activity tolerance impairments that are limiting ability to complete her usual activities including anything that requires walking, standing, and/or upright balance  including shopping, carrying, grocery shopping, navigating stairs, social outings, housework, yardwork, bathing and dressing without difficulty. Patient will benefit from skilled physical therapy intervention to address current body structure impairments and activity limitations to improve function and work towards goals set in current POC in order to return to prior level of function or maximal functional improvement.      OBJECTIVE IMPAIRMENTS Abnormal gait, decreased activity tolerance, decreased balance, decreased coordination, decreased endurance, decreased knowledge of condition, decreased knowledge of use of DME, decreased mobility, difficulty walking, decreased ROM, decreased strength, dizziness, hypomobility, increased edema, impaired perceived functional ability, impaired flexibility, impaired sensation, improper body mechanics, postural dysfunction, obesity, and pain.    ACTIVITY LIMITATIONS carrying, lifting, standing, squatting, stairs, transfers, bed mobility, continence, bathing, dressing, reach over head, locomotion level, and caring for others   PARTICIPATION LIMITATIONS: meal prep, cleaning, laundry, interpersonal relationship, shopping, community activity, yard work, and   anything that requires walking, standing, and/or upright balance including shopping, carrying, grocery shopping, navigating stairs, social outings, housework, yardwork, bathing and dressing, gardening, going to the movies.    PERSONAL FACTORS Age, Fitness, Time since onset of injury/illness/exacerbation, and 3+ comorbidities:   anxiety, arthritis, CAD with stents placed (monitored 1x a year by GP), depression, diastolic dysfunction, diverticulosis, hereditary lymphedema of B legs, history of angioedema, hyperlipidemia, HTN, lumbar spinal stenosis, s/P TAVR (05/11/2020), T2DM, Tubular adenoma of colon, varicose veins of both legs with edema, bladder stimulator (after bladder damaged from attempts to break up kidney stones  per pt), bilateral cataract surgery, B knee replacements (R 2018 with manipulation, L 2014), lumbar laminectomy/decompression/microdiscectomy at L3-5 (02/06/2022), urinary incontinence, former smoker are also affecting patient's functional outcome.    REHAB POTENTIAL: Good   CLINICAL DECISION MAKING: Evolving/moderate complexity   EVALUATION COMPLEXITY: Moderate     GOALS: Goals reviewed with patient? No   SHORT TERM GOALS: Target date: 04/25/2022   Patient will be independent with initial home exercise program for self-management of symptoms. Baseline: Initial HEP to be provided at visit 2 as appropriate (04/11/22); initial HEP provided at visit 2 (04/13/2022);  Goal status: In-progress     LONG TERM GOALS: Target date: 07/04/2022   Patient will be independent with a long-term home exercise program for self-management of symptoms.  Baseline: Initial HEP to be provided at visit 2 as appropriate (04/11/22); initial HEP provided at visit 2 (04/13/2022);  Goal status: In-progress   2.  Patient will demonstrate improved FOTO by equal or greater than 10 points to demonstrate improvement in overall condition and self-reported functional ability.  Baseline: to be measured visit 2 as appropriate (04/11/22); 40 at visit #2 (04/13/2022);  Goal status: In-progress   3.  Patient will ambulate equal or greater than 1000 feet on 6 Minute Walk Test with no AD to demonstrate improved ability to safely complete household and community ambulation without falls.  Baseline: to be tested visit 2 as appropriate (04/11/22); 710 feet with RW and mildly stooped posture, flexed knees. Reports her B LE felt  less numb during last part of test. Reported B UE fatigue (04/13/2022);  Goal status: In-progress   4.  Patient will demonstrate the ability to stand in tandem stance for equal or greater than 30 seconds on each side to demonstrate improved static balance for tasks such as cooking and gardening.  Baseline:  R =  16 seconds, L = 5 seconds  (04/11/22); Goal status: In-progress   5.  Patient will complete community, work and/or recreational activities without limitation due to current condition.  Baseline: difficulty with anything that requires walking, standing, and/or upright balance including shopping, carrying, grocery shopping, navigating stairs, social outings, housework, yardwork, bathing and dressing, walking on uneven ground (04/11/22); Goal status: In-progress       PLAN: PT FREQUENCY: 1-2x/week   PT DURATION: 12 weeks   PLANNED INTERVENTIONS: Therapeutic exercises, Therapeutic activity, Neuromuscular re-education, Balance training, Gait training, Patient/Family education, Joint mobilization, Stair training, DME instructions, Aquatic Therapy, Dry Needling, Electrical stimulation, Spinal mobilization, Cryotherapy, Moist heat, Manual therapy, and Re-evaluation.   PLAN FOR NEXT SESSION: update HEP as appropriate, progressive balance and functional/LE strengthening as appropriate.    Nancy Nordmann, PT, DPT 05/08/2022, 8:05 PM  [facility] [address] [place], [place] [address] P: [phone] I F: [phone]

## 2022-05-10 ENCOUNTER — Encounter: Payer: Self-pay | Admitting: Physical Therapy

## 2022-05-10 ENCOUNTER — Ambulatory Visit: Payer: Medicare Other | Admitting: Physical Therapy

## 2022-05-10 DIAGNOSIS — R2681 Unsteadiness on feet: Secondary | ICD-10-CM

## 2022-05-10 DIAGNOSIS — M6281 Muscle weakness (generalized): Secondary | ICD-10-CM

## 2022-05-10 DIAGNOSIS — M25651 Stiffness of right hip, not elsewhere classified: Secondary | ICD-10-CM

## 2022-05-10 DIAGNOSIS — R209 Unspecified disturbances of skin sensation: Secondary | ICD-10-CM

## 2022-05-10 DIAGNOSIS — M25652 Stiffness of left hip, not elsewhere classified: Secondary | ICD-10-CM

## 2022-05-10 DIAGNOSIS — M5459 Other low back pain: Secondary | ICD-10-CM

## 2022-05-10 DIAGNOSIS — R262 Difficulty in walking, not elsewhere classified: Secondary | ICD-10-CM

## 2022-05-10 DIAGNOSIS — R293 Abnormal posture: Secondary | ICD-10-CM

## 2022-05-10 NOTE — Therapy (Signed)
OUTPATIENT PHYSICAL THERAPY TREATMENT NOTE   Patient Name: Brandy Oneill MRN: [id number] DOB:Mar 26, 1951, 71 y.o., female Today's Date: 05/10/2022  PCP: Tracie Harrier, MD REFERRING PROVIDER: Meade Maw, MD  END OF SESSION:   PT End of Session - 05/10/22 1044     Visit Number 5    Number of Visits 24    Date for PT Re-Evaluation 07/04/22    Authorization Type MEDICARE PART B reporting period from 04/11/2022    Progress Note Due on Visit 10    PT Start Time 1037    PT Stop Time 1115    PT Time Calculation (min) 38 min    Activity Tolerance Patient tolerated treatment well;Patient limited by fatigue    Behavior During Therapy [facility] for tasks assessed/performed              Past Medical History:  Diagnosis Date   Anxiety    Arthritis    CAD (coronary artery disease) 04/14/2020   a.) RHC 04/14/2020: mod CAD with sev obstructive Dz in large dom RCA; staged PCI planned. b.) PCI 04/28/2020: DES (unknown type) placed to dRCA.   Depression    Diastolic dysfunction 28/00/3491   a.) TTE 01/06/2019: EF >55%; triv panvalvular regurgitation; G1DD. b.) TTE 01/26/2020: EF > 55%; mod LVH; triv MR/TR/PR; G1DD.   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 b.) TTE 01/26/2020: EF >55%; sev AS (MPG 47.1 mmHg). c.) RHC 04/14/2020: norm CI, PVR, RAP; PCWP 10. d.) s/p TAVR 05/11/2020. e.) TTE 06/01/2020: EF >55%; mild LVH; MPG 9 mmHg. f.) TTE 06/16/2021: EF >55%; mild LVH; GLS -11.6%; MPG 24 mmHg.   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   a.) 23 mm Edwards Sapien S3 bioprosthetic valve   T2DM (type 2 diabetes mellitus) ([place])    Tubular adenoma of colon    Varicose veins of both legs with edema    Past Surgical History:  Procedure Laterality Date   bladder stimulater  2014   CARDIAC VALVE REPLACEMENT     CATARACT EXTRACTION W/ INTRAOCULAR LENS IMPLANT Bilateral 2014   one eye done then the other eye done a month later   [place] N/A 06/26/2018   Procedure: COLONOSCOPY WITH PROPOFOL;  Surgeon: Toledo, Benay Pike, MD;  Location: [facility] ENDOSCOPY;  Service: Gastroenterology;  Laterality: N/A;   EYE SURGERY Right 2018   macular hole   JOINT REPLACEMENT Right 10/27/2016   JOINT REPLACEMENT Left 2014   KNEE CLOSED REDUCTION Right 11/29/2016   Procedure: CLOSED MANIPULATION KNEE;  Surgeon: Leanor Kail, MD;  Location: [facility] ORS;  Service: Orthopedics;  Laterality: Right;   LUMBAR LAMINECTOMY/DECOMPRESSION MICRODISCECTOMY N/A 02/06/2022   Procedure: L3-5 DECOMPRESSION;  Surgeon: Meade Maw, MD;  Location: [facility] ORS;  Service: Neurosurgery;  Laterality: N/A;   Patient Active Problem List   Diagnosis Date Noted   Lumbar stenosis 02/06/2022   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   CAD (  coronary artery disease) 04/28/2020   S/P angioplasty with stent 04/28/2020   Nonrheumatic aortic valve stenosis 03/12/2020   Essential hypertension 02/06/2019   Hyperlipidemia 02/06/2019   Spinal stenosis of lumbar region with radiculopathy 09/27/2018   BMI 38.0-38.9,adult 09/26/2018   Back pain 09/05/2018   Diet-controlled type 2 diabetes mellitus ([place]) 03/27/2018   Postmenopausal 03/27/2018   DJD (degenerative joint disease) 02/04/2018   History of angioedema 09/17/2017   Hyperglycemia 09/17/2017   Lymphedema 02/04/2017   Chronic venous insufficiency 02/04/2017   Swelling of limb 02/04/2017   Pure  hypercholesterolemia 10/02/2016   History of total knee arthroplasty 09/20/2016   Varicose veins of both legs with edema 03/25/2016   Abnormal mammogram 06/01/2015   Urinary incontinence in female 06/01/2015   Prediabetes 06/16/2014   Anxiety and depression 01/28/2014   Psoriasis 01/28/2014    REFERRING DIAG: spinal stenosis of lumbar region with neurogenic claudication, imbalance  THERAPY DIAG:  Other low back pain  Unspecified disturbances of skin sensation  Difficulty in walking, not elsewhere classified  Unsteadiness on feet  Muscle weakness (generalized)  Stiffness of right hip, not elsewhere classified  Stiffness of left hip, not elsewhere classified  Abnormal posture  Rationale for Evaluation and Treatment: Rehabilitation  PERTINENT HISTORY: Patient is a 71 y.o. female who presents to outpatient physical therapy with a referral for medical diagnosis spinal stenosis of lumbar region with neurogenic claudication, imbalance. This patient's chief complaints consist of numbness and weakness in her B lower legs following L3-5 lumbar decompression including central laminectomy and bilateral medial facetectomies including foraminotomies on  02/06/22 and difficulty with mobility and balance leading to the following functional deficits: difficulty with anything that requires walking, standing, and/or upright balance including shopping, carrying, grocery shopping, navigating stairs, social outings, housework, yardwork, bathing and dressing. Relevant past medical history and comorbidities include anxiety, arthritis, CAD with stents placed (monitored 1x a year by GP), depression, diastolic dysfunction, diverticulosis, hereditary lymphedema of B legs, history of angioedema, hyperlipidemia, HTN, lumbar spinal stenosis, s/P TAVR (05/11/2020), T2DM, Tubular adenoma of colon, varicose veins of both legs with edema, bladder stimulator (after bladder damaged from attempts to break up kidney stones  per pt), bilateral cataract surgery, B knee replacements (R 2018 with manipulation, L 2014), lumbar laminectomy/decompression/microdiscectomy at L3-5 (02/06/2022), urinary incontinence, former smoker.  Patient denies hx of cancer, stroke, seizures, lung problems, unexplained weight loss, and osteoporosis. She endorses worsening urinary incontinence since back surgery; has a history of bladder damage that made it hard for her to get to control her urine once she felt urgency; now, she does not feel urgency since having her back surgery. She has a bladder stimulator that is not working since right before [employer].   PRECAUTIONS: do not lift anything over 25#, fall.   SUBJECTIVE: Patient reports she continues to feel better. She states she was hot after last PT session but recovered okay.  She arrives with [facility] and wearing KN95 mask. She denies pain but endorses numbness in B LE. She has not been doing her HEP, but she did some cleaning.   PAIN:  Are you having pain? Numbness in bilateral lower legs.   OBJECTIVE  SELF-REPORTED FUNCTION FOTO score: 54/100 (balance questionnaire)  TODAY'S TREATMENT  Therapeutic exercise: to centralize symptoms and improve ROM, strength, muscular endurance, and activity tolerance required for successful completion of functional activities.  - NuStep level 3 using bilateral upper and lower extremities. Seat/handle setting 7/7. For improved extremity mobility, muscular endurance, and activity tolerance; and  to induce the analgesic effect of aerobic exercise, stimulate improved joint nutrition, and prepare body structures and systems for following interventions. x 6 minutes. Average SPM = 56. - standing B heel raise with balls of feet on 2x4 board, B UE support, 2x10.   Neuromuscular Re-education: to improve, balance, postural strength, muscle activation patterns, and stabilization strength required for functional activities: - static standing, self selected stance  on airex progressing to semi-narrow stance, eyes open with head turns, 3x20 each direction/ intermittent UE support, SBA.  - static standing semi-narrow stance on airex, eyes closed, 3x1 min, intermittent UE support. SBA. - standing alternating foot taps on 8.5 inch surface progressing from B UE support and looking at feet to looking straight ahead and using 1 finger on one UE for support.  - forwards/backwards stepping over 6 inch hurdle, 1x10 each direction with CGA and U UE support on TM bar. - forwards/backwards stepping over 6 inch hurdle, 1x10 each direction with CGA and U UE support on SPC. - ambulation approx 100 feet down ramp with SPC, and SBA  Pt required multimodal cuing for proper technique and to facilitate improved neuromuscular control, strength, range of motion, and functional ability resulting in improved performance and form.     PATIENT EDUCATION:  Education details: Exercise purpose/form. Self management techniques. Education on HEP including handout  Person educated: Patient Education method: Explanation, demonstration, verbal cuing Education comprehension: verbalized and demonstrated understanding and needs further education     HOME EXERCISE PROGRAM: Access Code: [email or web address] URL: [email or web address] Date: 04/13/2022 Prepared by: Rosita Kea  Exercises - Tandem Stance  - 1 x daily - 2 sets - 1 minute hold - Corner Balance Feet Together With Eyes Closed  - 1 x daily - 2 sets - 1 min hold - Corner Balance Feet Together: Eyes Open With Head Turns  - 1 x daily - 2 sets - 20 reps - Sit to Stand Without Arm Support  - 1 x daily - 3 sets - 10 reps   ASSESSMENT:   CLINICAL IMPRESSION: Patient tolerated treatment well overall with sufficient rest breaks. Session continued to focus on static/dynamic balance and B LE and functional strengthening. Patient is very unsteady on feet and often stumbles backwards when turning and/or walking backwards. Patient would  benefit from continued management of limiting condition by skilled physical therapist to address remaining impairments and functional limitations to work towards stated goals and return to PLOF or maximal functional independence.   From PT eval 04/11/2022:  Patient is a 71 y.o. female referred to outpatient physical therapy with a medical diagnosis of spinal stenosis of lumbar region with neurogenic claudication and imbalance who presents with signs and symptoms consistent with unsteadiness on feet, loss of sensation, and difficulty with strength and motor control of B lower legs as well as hip and back stiffness with weakness in right dorsiflexion and great toe extension. She also reported increased urinary incontinence since surgery where she leaks without urge, so she has been wearing diapers. Lower leg symptoms appear similar to that expected from peripheral neuropathy. R ankle/foot weakness suggests possible former or current compromise of L4 or L5 nerve root, but could also be related to neuropathy symptoms. Patient denies weakness or numbness in B lower legs prior to recent back surgery and and chart review lacks prior record of involvement of patient's lower legs, ankles, or feet.  Patient's symptoms were unable to be worsened or relieved with exam today and PT was unable to establish a direct  link between lumbar spine and lower leg numbness/weakness. Patient is also suffering from significant stooped posture with lack of hip and back extension ROM contributing. This is likely related to having difficulty with maintaining standing postures for a prolonged period related to spinal stenosis and back pain in standing postures. Patient presents with significant pain, ROM, sensation, balance, motor control, posture, joint stiffness, gait, muscle tension, muscle performance (strength/power/endurance) and activity tolerance impairments that are limiting ability to complete her usual activities including anything  that requires walking, standing, and/or upright balance including shopping, carrying, grocery shopping, navigating stairs, social outings, housework, yardwork, bathing and dressing without difficulty. Patient will benefit from skilled physical therapy intervention to address current body structure impairments and activity limitations to improve function and work towards goals set in current POC in order to return to prior level of function or maximal functional improvement.      OBJECTIVE IMPAIRMENTS Abnormal gait, decreased activity tolerance, decreased balance, decreased coordination, decreased endurance, decreased knowledge of condition, decreased knowledge of use of DME, decreased mobility, difficulty walking, decreased ROM, decreased strength, dizziness, hypomobility, increased edema, impaired perceived functional ability, impaired flexibility, impaired sensation, improper body mechanics, postural dysfunction, obesity, and pain.    ACTIVITY LIMITATIONS carrying, lifting, standing, squatting, stairs, transfers, bed mobility, continence, bathing, dressing, reach over head, locomotion level, and caring for others   PARTICIPATION LIMITATIONS: meal prep, cleaning, laundry, interpersonal relationship, shopping, community activity, yard work, and   anything that requires walking, standing, and/or upright balance including shopping, carrying, grocery shopping, navigating stairs, social outings, housework, yardwork, bathing and dressing, gardening, going to the movies.    PERSONAL FACTORS Age, Fitness, Time since onset of injury/illness/exacerbation, and 3+ comorbidities:   anxiety, arthritis, CAD with stents placed (monitored 1x a year by GP), depression, diastolic dysfunction, diverticulosis, hereditary lymphedema of B legs, history of angioedema, hyperlipidemia, HTN, lumbar spinal stenosis, s/P TAVR (05/11/2020), T2DM, Tubular adenoma of colon, varicose veins of both legs with edema, bladder stimulator (after  bladder damaged from attempts to break up kidney stones per pt), bilateral cataract surgery, B knee replacements (R 2018 with manipulation, L 2014), lumbar laminectomy/decompression/microdiscectomy at L3-5 (02/06/2022), urinary incontinence, former smoker are also affecting patient's functional outcome.    REHAB POTENTIAL: Good   CLINICAL DECISION MAKING: Evolving/moderate complexity   EVALUATION COMPLEXITY: Moderate     GOALS: Goals reviewed with patient? No   SHORT TERM GOALS: Target date: 04/25/2022   Patient will be independent with initial home exercise program for self-management of symptoms. Baseline: Initial HEP to be provided at visit 2 as appropriate (04/11/22); initial HEP provided at visit 2 (04/13/2022);  Goal status: In-progress     LONG TERM GOALS: Target date: 07/04/2022   Patient will be independent with a long-term home exercise program for self-management of symptoms.  Baseline: Initial HEP to be provided at visit 2 as appropriate (04/11/22); initial HEP provided at visit 2 (04/13/2022);  Goal status: In-progress   2.  Patient will demonstrate improved FOTO by equal or greater than 10 points to demonstrate improvement in overall condition and self-reported functional ability.  Baseline: to be measured visit 2 as appropriate (04/11/22); 40 at visit #2 (04/13/2022);  Goal status: In-progress   3.  Patient will ambulate equal or greater than 1000 feet on 6 Minute Walk Test with no AD to demonstrate improved ability to safely complete household and community ambulation without falls.  Baseline: to be tested visit 2 as appropriate (04/11/22); 710 feet with RW and mildly stooped posture, flexed  knees. Reports her B LE felt less numb during last part of test. Reported B UE fatigue (04/13/2022);  Goal status: In-progress   4.  Patient will demonstrate the ability to stand in tandem stance for equal or greater than 30 seconds on each side to demonstrate improved static balance for  tasks such as cooking and gardening.  Baseline:  R = 16 seconds, L = 5 seconds  (04/11/22); Goal status: In-progress   5.  Patient will complete community, work and/or recreational activities without limitation due to current condition.  Baseline: difficulty with anything that requires walking, standing, and/or upright balance including shopping, carrying, grocery shopping, navigating stairs, social outings, housework, yardwork, bathing and dressing, walking on uneven ground (04/11/22); Goal status: In-progress       PLAN: PT FREQUENCY: 1-2x/week   PT DURATION: 12 weeks   PLANNED INTERVENTIONS: Therapeutic exercises, Therapeutic activity, Neuromuscular re-education, Balance training, Gait training, Patient/Family education, Joint mobilization, Stair training, DME instructions, Aquatic Therapy, Dry Needling, Electrical stimulation, Spinal mobilization, Cryotherapy, Moist heat, Manual therapy, and Re-evaluation.   PLAN FOR NEXT SESSION: update HEP as appropriate, progressive balance and functional/LE strengthening as appropriate.    Nancy Nordmann, PT, DPT 05/10/2022, 1:33 PM  [facility] [address] [place], [place] [address] P: [phone] I F: [phone]

## 2022-05-11 ENCOUNTER — Ambulatory Visit (INDEPENDENT_AMBULATORY_CARE_PROVIDER_SITE_OTHER): Payer: Medicare Other | Admitting: Neurosurgery

## 2022-05-11 ENCOUNTER — Encounter: Payer: Self-pay | Admitting: Neurosurgery

## 2022-05-11 VITALS — BP 116/76 | Ht 63.5 in | Wt 218.2 lb

## 2022-05-11 DIAGNOSIS — Z09 Encounter for follow-up examination after completed treatment for conditions other than malignant neoplasm: Secondary | ICD-10-CM | POA: Diagnosis not present

## 2022-05-11 DIAGNOSIS — M48062 Spinal stenosis, lumbar region with neurogenic claudication: Secondary | ICD-10-CM | POA: Diagnosis not present

## 2022-05-11 NOTE — Progress Notes (Signed)
   DOS: 02/06/22 (L3-5 decompression)  HISTORY OF PRESENT ILLNESS: 05/11/2022 [name] is status post the above surgery.  Her leg symptoms have improved significantly, but she still has numbness in her legs which has made it somewhat difficult to walk.  She is doing better than she was last time I saw her.    She got COVID and tested positive on September 8.  She is no longer febrile, but continues to have some upper respiratory symptoms.  PHYSICAL EXAMINATION:   Vitals:   05/11/22 0958  BP: 116/76    General: Patient is well developed, well nourished, calm, collected, and in no apparent distress.  NEUROLOGICAL:  General: In no acute distress.  Awake, alert, oriented to person, place, and time. Pupils equal round and reactive to light.   Strength:  Side Iliopsoas Quads Hamstring PF DF EHL  R '5 5 5 5 5 5  '$ L '5 5 5 5 5 5   '$ Incision c/d/i   ROS (Neurologic): Negative except as noted above  IMAGING: No interval imaging to review   ASSESSMENT/PLAN:  Brandy Oneill is doing better after lumbar decompression.  I will see her back in 3 months to see if her neuropathy is improving.  We discussed gabapentin or other neuropathic pain meds, but she would not like to start those at this point.  She has gotten somewhat better with therapy.  I am hopeful that she will continue to improve.    I spent a total of 15 minutes in face-to-face and non-face-to-face activities related to this patient's care today.   Meade Maw MD, [facility] Department of Neurosurgery

## 2022-05-16 ENCOUNTER — Ambulatory Visit: Payer: Medicare Other | Admitting: Physical Therapy

## 2022-05-16 ENCOUNTER — Encounter: Payer: Self-pay | Admitting: Physical Therapy

## 2022-05-16 DIAGNOSIS — R293 Abnormal posture: Secondary | ICD-10-CM

## 2022-05-16 DIAGNOSIS — M25651 Stiffness of right hip, not elsewhere classified: Secondary | ICD-10-CM

## 2022-05-16 DIAGNOSIS — R209 Unspecified disturbances of skin sensation: Secondary | ICD-10-CM

## 2022-05-16 DIAGNOSIS — M25652 Stiffness of left hip, not elsewhere classified: Secondary | ICD-10-CM

## 2022-05-16 DIAGNOSIS — M5459 Other low back pain: Secondary | ICD-10-CM | POA: Diagnosis not present

## 2022-05-16 DIAGNOSIS — M6281 Muscle weakness (generalized): Secondary | ICD-10-CM

## 2022-05-16 DIAGNOSIS — R2681 Unsteadiness on feet: Secondary | ICD-10-CM

## 2022-05-16 DIAGNOSIS — R262 Difficulty in walking, not elsewhere classified: Secondary | ICD-10-CM

## 2022-05-16 NOTE — Therapy (Signed)
OUTPATIENT PHYSICAL THERAPY TREATMENT NOTE   Patient Name: Brandy Oneill MRN: [id number] DOB:01/29/1951, 71 y.o., female Today's Date: 05/16/2022  PCP: Tracie Harrier, MD REFERRING PROVIDER: Meade Maw, MD  END OF SESSION:   PT End of Session - 05/16/22 1448     Visit Number 6    Number of Visits 24    Date for PT Re-Evaluation 07/04/22    Authorization Type MEDICARE PART B reporting period from 04/11/2022    Progress Note Due on Visit 10    PT Start Time 1440    PT Stop Time 1510    PT Time Calculation (min) 30 min    Activity Tolerance Patient tolerated treatment well;Patient limited by fatigue    Behavior During Therapy [facility] for tasks assessed/performed               Past Medical History:  Diagnosis Date   Anxiety    Arthritis    CAD (coronary artery disease) 04/14/2020   a.) RHC 04/14/2020: mod CAD with sev obstructive Dz in large dom RCA; staged PCI planned. b.) PCI 04/28/2020: DES (unknown type) placed to dRCA.   Depression    Diastolic dysfunction 40/98/1191   a.) TTE 01/06/2019: EF >55%; triv panvalvular regurgitation; G1DD. b.) TTE 01/26/2020: EF > 55%; mod LVH; triv MR/TR/PR; G1DD.   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 b.) TTE 01/26/2020: EF >55%; sev AS (MPG 47.1 mmHg). c.) RHC 04/14/2020: norm CI, PVR, RAP; PCWP 10. d.) s/p TAVR 05/11/2020. e.) TTE 06/01/2020: EF >55%; mild LVH; MPG 9 mmHg. f.) TTE 06/16/2021: EF >55%; mild LVH; GLS -11.6%; MPG 24 mmHg.   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   a.) 23 mm Edwards Sapien S3 bioprosthetic valve   T2DM (type 2 diabetes mellitus) ([place])    Tubular adenoma of colon    Varicose veins of both legs with edema    Past Surgical History:  Procedure Laterality Date   bladder stimulater  2014   CARDIAC VALVE REPLACEMENT     CATARACT EXTRACTION W/ INTRAOCULAR LENS IMPLANT Bilateral 2014   one eye done then the other eye done a month later   [place] N/A 06/26/2018   Procedure: COLONOSCOPY WITH PROPOFOL;  Surgeon: Toledo, Benay Pike, MD;  Location: [facility] ENDOSCOPY;  Service: Gastroenterology;  Laterality: N/A;   EYE SURGERY Right 2018   macular hole   JOINT REPLACEMENT Right 10/27/2016   JOINT REPLACEMENT Left 2014   KNEE CLOSED REDUCTION Right 11/29/2016   Procedure: CLOSED MANIPULATION KNEE;  Surgeon: Leanor Kail, MD;  Location: [facility] ORS;  Service: Orthopedics;  Laterality: Right;   LUMBAR LAMINECTOMY/DECOMPRESSION MICRODISCECTOMY N/A 02/06/2022   Procedure: L3-5 DECOMPRESSION;  Surgeon: Meade Maw, MD;  Location: [facility] ORS;  Service: Neurosurgery;  Laterality: N/A;   Patient Active Problem List   Diagnosis Date Noted   Lumbar stenosis 02/06/2022   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  CAD (coronary artery disease) 04/28/2020   S/P angioplasty with stent 04/28/2020   Nonrheumatic aortic valve stenosis 03/12/2020   Essential hypertension 02/06/2019   Hyperlipidemia 02/06/2019   Spinal stenosis of lumbar region with radiculopathy 09/27/2018   BMI 38.0-38.9,adult 09/26/2018   Back pain 09/05/2018   Diet-controlled type 2 diabetes mellitus ([place]) 03/27/2018   Postmenopausal 03/27/2018   DJD (degenerative joint disease) 02/04/2018   History of angioedema 09/17/2017   Hyperglycemia 09/17/2017   Lymphedema 02/04/2017   Chronic venous insufficiency 02/04/2017   Swelling of limb 02/04/2017   Pure  hypercholesterolemia 10/02/2016   History of total knee arthroplasty 09/20/2016   Varicose veins of both legs with edema 03/25/2016   Abnormal mammogram 06/01/2015   Urinary incontinence in female 06/01/2015   Prediabetes 06/16/2014   Anxiety and depression 01/28/2014   Psoriasis 01/28/2014    REFERRING DIAG: spinal stenosis of lumbar region with neurogenic claudication, imbalance  THERAPY DIAG:  Other low back pain  Unspecified disturbances of skin sensation  Difficulty in walking, not elsewhere classified  Unsteadiness on feet  Muscle weakness (generalized)  Stiffness of right hip, not elsewhere classified  Stiffness of left hip, not elsewhere classified  Abnormal posture  Rationale for Evaluation and Treatment: Rehabilitation  PERTINENT HISTORY: Patient is a 71 y.o. female who presents to outpatient physical therapy with a referral for medical diagnosis spinal stenosis of lumbar region with neurogenic claudication, imbalance. This patient's chief complaints consist of numbness and weakness in her B lower legs following L3-5 lumbar decompression including central laminectomy and bilateral medial facetectomies including foraminotomies on  02/06/22 and difficulty with mobility and balance leading to the following functional deficits: difficulty with anything that requires walking, standing, and/or upright balance including shopping, carrying, grocery shopping, navigating stairs, social outings, housework, yardwork, bathing and dressing. Relevant past medical history and comorbidities include anxiety, arthritis, CAD with stents placed (monitored 1x a year by GP), depression, diastolic dysfunction, diverticulosis, hereditary lymphedema of B legs, history of angioedema, hyperlipidemia, HTN, lumbar spinal stenosis, s/P TAVR (05/11/2020), T2DM, Tubular adenoma of colon, varicose veins of both legs with edema, bladder stimulator (after bladder damaged from attempts to break up kidney stones  per pt), bilateral cataract surgery, B knee replacements (R 2018 with manipulation, L 2014), lumbar laminectomy/decompression/microdiscectomy at L3-5 (02/06/2022), urinary incontinence, former smoker.  Patient denies hx of cancer, stroke, seizures, lung problems, unexplained weight loss, and osteoporosis. She endorses worsening urinary incontinence since back surgery; has a history of bladder damage that made it hard for her to get to control her urine once she felt urgency; now, she does not feel urgency since having her back surgery. She has a bladder stimulator that is not working since right before [employer].   PRECAUTIONS: do not lift anything over 25#, fall.   SUBJECTIVE: Patient arrives with [facility]. She is planning to go back to [facility] for water aerobics. She used to do water aerobics (T/R 7-7:45) and chair yoga (11 am T/R). She states she prefers the Press photographer those days. PT has been interfering with her chair yoga time. She states she feels like her balance is improving but the numbness is staying the same. She saw Dr. Izora Ribas who advised her to come back in January to see if her neuropathy is improving. She denies falls since last PT session. She felt okay after last PT session.   PAIN:  Are you having pain? Numbness in bilateral lower legs.   OBJECTIVE   TODAY'S TREATMENT  Therapeutic exercise: to  centralize symptoms and improve ROM, strength, muscular endurance, and activity tolerance required for successful completion of functional activities.  - NuStep level 3 using bilateral upper and lower extremities. Seat/handle setting 7/9. For improved extremity mobility, muscular endurance, and activity tolerance; and to induce the analgesic effect of aerobic exercise, stimulate improved joint nutrition, and prepare body structures and systems for following interventions. x 6 minutes. Average SPM = 56. - standing B heel raise with balls of feet on 2x4 board, B UE support, 3x10.    Neuromuscular Re-education: to improve, balance, postural strength, muscle activation patterns, and stabilization strength required for functional activities: - static standing, self selected stance on airex progressing to semi-narrow stance, eyes open with head turns, 3x20 each direction/ intermittent UE support, SBA.  - static standing semi-narrow stance on airex, eyes closed, 3x1 min, intermittent UE support. SBA. - standing alternating foot taps on 8.5 inch surface progressing from B UE support and looking at feet to looking straight ahead and using 1 finger on one UE to no UE for support. 3x10 each side.  - forwards/backwards stepping over 6 inch hurdle, 2x10 each direction with CGA-minA and U UE support on SPC - ambulation approx 100 feet down ramp from clinic to vehicle with SPC, and SBA  Pt required multimodal cuing for proper technique and to facilitate improved neuromuscular control, strength, range of motion, and functional ability resulting in improved performance and form.     PATIENT EDUCATION:  Education details: Exercise purpose/form. Self management techniques. Education on HEP including handout  Person educated: Patient Education method: Explanation, demonstration, verbal cuing Education comprehension: verbalized and demonstrated understanding and needs further education     HOME EXERCISE PROGRAM: Access Code: [email or web address] URL: [email or web address] Date: 04/13/2022 Prepared by: Rosita Kea  Exercises - Tandem Stance  - 1 x daily - 2 sets - 1 minute hold - Corner Balance Feet Together With Eyes Closed  - 1 x daily - 2 sets - 1 min hold - Corner Balance Feet Together: Eyes Open With Head Turns  - 1 x daily - 2 sets - 20 reps - Sit to Stand Without Arm Support  - 1 x daily - 3 sets - 10 reps   ASSESSMENT:   CLINICAL IMPRESSION: Patient arrived late and session was not able to be extended. She had more energy this session and needed less seated breaks. She  tolerated treatment well overall and was able to progress her exercises slightly. She continues to be quite unsteady on feet, especially with backwards walking and turning. Plan to continue with balance and functional strengthening as appropriate next session. Patient would benefit from continued management of limiting condition by skilled physical therapist to address remaining impairments and functional limitations to work towards stated goals and return to PLOF or maximal functional independence.   From PT eval 04/11/2022:  Patient is a 71 y.o. female referred to outpatient physical therapy with a medical diagnosis of spinal stenosis of lumbar region with neurogenic claudication and imbalance who presents with signs and symptoms consistent with unsteadiness on feet, loss of sensation, and difficulty with strength and motor control of B lower legs as well as hip and back stiffness with weakness in right dorsiflexion and great toe extension. She also reported increased urinary incontinence since surgery where she leaks without urge, so she has been wearing diapers. Lower leg symptoms appear similar to that expected from peripheral neuropathy. R ankle/foot weakness suggests possible former or current compromise of L4 or L5 nerve root, but could  also be related to neuropathy symptoms. Patient denies weakness or numbness in B lower legs prior to recent back surgery and and chart review lacks prior record of involvement of patient's lower legs, ankles, or feet.  Patient's symptoms were unable to be worsened or relieved with exam today and PT was unable to establish a direct link between lumbar spine and lower leg numbness/weakness. Patient is also suffering from significant stooped posture with lack of hip and back extension ROM contributing. This is likely related to having difficulty with maintaining standing postures for a prolonged period related to spinal stenosis and back pain in standing postures. Patient  presents with significant pain, ROM, sensation, balance, motor control, posture, joint stiffness, gait, muscle tension, muscle performance (strength/power/endurance) and activity tolerance impairments that are limiting ability to complete her usual activities including anything that requires walking, standing, and/or upright balance including shopping, carrying, grocery shopping, navigating stairs, social outings, housework, yardwork, bathing and dressing without difficulty. Patient will benefit from skilled physical therapy intervention to address current body structure impairments and activity limitations to improve function and work towards goals set in current POC in order to return to prior level of function or maximal functional improvement.      OBJECTIVE IMPAIRMENTS Abnormal gait, decreased activity tolerance, decreased balance, decreased coordination, decreased endurance, decreased knowledge of condition, decreased knowledge of use of DME, decreased mobility, difficulty walking, decreased ROM, decreased strength, dizziness, hypomobility, increased edema, impaired perceived functional ability, impaired flexibility, impaired sensation, improper body mechanics, postural dysfunction, obesity, and pain.    ACTIVITY LIMITATIONS carrying, lifting, standing, squatting, stairs, transfers, bed mobility, continence, bathing, dressing, reach over head, locomotion level, and caring for others   PARTICIPATION LIMITATIONS: meal prep, cleaning, laundry, interpersonal relationship, shopping, community activity, yard work, and   anything that requires walking, standing, and/or upright balance including shopping, carrying, grocery shopping, navigating stairs, social outings, housework, yardwork, bathing and dressing, gardening, going to the movies.    PERSONAL FACTORS Age, Fitness, Time since onset of injury/illness/exacerbation, and 3+ comorbidities:   anxiety, arthritis, CAD with stents placed (monitored 1x a year  by GP), depression, diastolic dysfunction, diverticulosis, hereditary lymphedema of B legs, history of angioedema, hyperlipidemia, HTN, lumbar spinal stenosis, s/P TAVR (05/11/2020), T2DM, Tubular adenoma of colon, varicose veins of both legs with edema, bladder stimulator (after bladder damaged from attempts to break up kidney stones per pt), bilateral cataract surgery, B knee replacements (R 2018 with manipulation, L 2014), lumbar laminectomy/decompression/microdiscectomy at L3-5 (02/06/2022), urinary incontinence, former smoker are also affecting patient's functional outcome.    REHAB POTENTIAL: Good   CLINICAL DECISION MAKING: Evolving/moderate complexity   EVALUATION COMPLEXITY: Moderate     GOALS: Goals reviewed with patient? No   SHORT TERM GOALS: Target date: 04/25/2022   Patient will be independent with initial home exercise program for self-management of symptoms. Baseline: Initial HEP to be provided at visit 2 as appropriate (04/11/22); initial HEP provided at visit 2 (04/13/2022);  Goal status: In-progress     LONG TERM GOALS: Target date: 07/04/2022   Patient will be independent with a long-term home exercise program for self-management of symptoms.  Baseline: Initial HEP to be provided at visit 2 as appropriate (04/11/22); initial HEP provided at visit 2 (04/13/2022);  Goal status: In-progress   2.  Patient will demonstrate improved FOTO by equal or greater than 10 points to demonstrate improvement in overall condition and self-reported functional ability.  Baseline: to be measured visit 2 as appropriate (04/11/22); 40 at visit #2 (04/13/2022);  Goal status: In-progress   3.  Patient will ambulate equal or greater than 1000 feet on 6 Minute Walk Test with no AD to demonstrate improved ability to safely complete household and community ambulation without falls.  Baseline: to be tested visit 2 as appropriate (04/11/22); 710 feet with RW and mildly stooped posture, flexed knees.  Reports her B LE felt less numb during last part of test. Reported B UE fatigue (04/13/2022);  Goal status: In-progress   4.  Patient will demonstrate the ability to stand in tandem stance for equal or greater than 30 seconds on each side to demonstrate improved static balance for tasks such as cooking and gardening.  Baseline:  R = 16 seconds, L = 5 seconds  (04/11/22); Goal status: In-progress   5.  Patient will complete community, work and/or recreational activities without limitation due to current condition.  Baseline: difficulty with anything that requires walking, standing, and/or upright balance including shopping, carrying, grocery shopping, navigating stairs, social outings, housework, yardwork, bathing and dressing, walking on uneven ground (04/11/22); Goal status: In-progress       PLAN: PT FREQUENCY: 1-2x/week   PT DURATION: 12 weeks   PLANNED INTERVENTIONS: Therapeutic exercises, Therapeutic activity, Neuromuscular re-education, Balance training, Gait training, Patient/Family education, Joint mobilization, Stair training, DME instructions, Aquatic Therapy, Dry Needling, Electrical stimulation, Spinal mobilization, Cryotherapy, Moist heat, Manual therapy, and Re-evaluation.   PLAN FOR NEXT SESSION: update HEP as appropriate, progressive balance and functional/LE strengthening as appropriate.    Nancy Nordmann, PT, DPT 05/16/2022, 5:15 PM  [facility] [address] [place], [place] [address] P: [phone] I F: [phone]

## 2022-05-18 ENCOUNTER — Encounter: Payer: Self-pay | Admitting: Physical Therapy

## 2022-05-18 ENCOUNTER — Ambulatory Visit: Payer: Medicare Other | Admitting: Physical Therapy

## 2022-05-18 DIAGNOSIS — M5459 Other low back pain: Secondary | ICD-10-CM

## 2022-05-18 DIAGNOSIS — M6281 Muscle weakness (generalized): Secondary | ICD-10-CM

## 2022-05-18 DIAGNOSIS — R209 Unspecified disturbances of skin sensation: Secondary | ICD-10-CM

## 2022-05-18 DIAGNOSIS — R293 Abnormal posture: Secondary | ICD-10-CM

## 2022-05-18 DIAGNOSIS — R262 Difficulty in walking, not elsewhere classified: Secondary | ICD-10-CM

## 2022-05-18 DIAGNOSIS — M25651 Stiffness of right hip, not elsewhere classified: Secondary | ICD-10-CM

## 2022-05-18 DIAGNOSIS — R2681 Unsteadiness on feet: Secondary | ICD-10-CM

## 2022-05-18 DIAGNOSIS — M25652 Stiffness of left hip, not elsewhere classified: Secondary | ICD-10-CM

## 2022-05-18 NOTE — Therapy (Signed)
OUTPATIENT PHYSICAL THERAPY TREATMENT NOTE   Patient Name: Brandy Oneill MRN: [id number] DOB:Apr 27, 1951, 71 y.o., female Today's Date: 05/18/2022  PCP: Tracie Harrier, MD REFERRING PROVIDER: Meade Maw, MD  END OF SESSION:   PT End of Session - 05/18/22 1613     Visit Number 7    Number of Visits 24    Date for PT Re-Evaluation 07/04/22    Authorization Type MEDICARE PART B reporting period from 04/11/2022    Progress Note Due on Visit 10    PT Start Time 1607    PT Stop Time 1645    PT Time Calculation (min) 38 min    Activity Tolerance Patient tolerated treatment well;Patient limited by fatigue    Behavior During Therapy [facility] for tasks assessed/performed              Past Medical History:  Diagnosis Date   Anxiety    Arthritis    CAD (coronary artery disease) 04/14/2020   a.) RHC 04/14/2020: mod CAD with sev obstructive Dz in large dom RCA; staged PCI planned. b.) PCI 04/28/2020: DES (unknown type) placed to dRCA.   Depression    Diastolic dysfunction 08/67/6195   a.) TTE 01/06/2019: EF >55%; triv panvalvular regurgitation; G1DD. b.) TTE 01/26/2020: EF > 55%; mod LVH; triv MR/TR/PR; G1DD.   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 b.) TTE 01/26/2020: EF >55%; sev AS (MPG 47.1 mmHg). c.) RHC 04/14/2020: norm CI, PVR, RAP; PCWP 10. d.) s/p TAVR 05/11/2020. e.) TTE 06/01/2020: EF >55%; mild LVH; MPG 9 mmHg. f.) TTE 06/16/2021: EF >55%; mild LVH; GLS -11.6%; MPG 24 mmHg.   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   a.) 23 mm Edwards Sapien S3 bioprosthetic valve   T2DM (type 2 diabetes mellitus) ([place])    Tubular adenoma of colon    Varicose veins of both legs with edema    Past Surgical History:  Procedure Laterality Date   bladder stimulater  2014   CARDIAC VALVE REPLACEMENT     CATARACT EXTRACTION W/ INTRAOCULAR LENS IMPLANT Bilateral 2014   one eye done then the other eye done a month later   [place] N/A 06/26/2018   Procedure: COLONOSCOPY WITH PROPOFOL;  Surgeon: Toledo, Benay Pike, MD;  Location: [facility] ENDOSCOPY;  Service: Gastroenterology;  Laterality: N/A;   EYE SURGERY Right 2018   macular hole   JOINT REPLACEMENT Right 10/27/2016   JOINT REPLACEMENT Left 2014   KNEE CLOSED REDUCTION Right 11/29/2016   Procedure: CLOSED MANIPULATION KNEE;  Surgeon: Leanor Kail, MD;  Location: [facility] ORS;  Service: Orthopedics;  Laterality: Right;   LUMBAR LAMINECTOMY/DECOMPRESSION MICRODISCECTOMY N/A 02/06/2022   Procedure: L3-5 DECOMPRESSION;  Surgeon: Meade Maw, MD;  Location: [facility] ORS;  Service: Neurosurgery;  Laterality: N/A;   Patient Active Problem List   Diagnosis Date Noted   Lumbar stenosis 02/06/2022   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   CAD (  coronary artery disease) 04/28/2020   S/P angioplasty with stent 04/28/2020   Nonrheumatic aortic valve stenosis 03/12/2020   Essential hypertension 02/06/2019   Hyperlipidemia 02/06/2019   Spinal stenosis of lumbar region with radiculopathy 09/27/2018   BMI 38.0-38.9,adult 09/26/2018   Back pain 09/05/2018   Diet-controlled type 2 diabetes mellitus ([place]) 03/27/2018   Postmenopausal 03/27/2018   DJD (degenerative joint disease) 02/04/2018   History of angioedema 09/17/2017   Hyperglycemia 09/17/2017   Lymphedema 02/04/2017   Chronic venous insufficiency 02/04/2017   Swelling of limb 02/04/2017   Pure  hypercholesterolemia 10/02/2016   History of total knee arthroplasty 09/20/2016   Varicose veins of both legs with edema 03/25/2016   Abnormal mammogram 06/01/2015   Urinary incontinence in female 06/01/2015   Prediabetes 06/16/2014   Anxiety and depression 01/28/2014   Psoriasis 01/28/2014    REFERRING DIAG: spinal stenosis of lumbar region with neurogenic claudication, imbalance  THERAPY DIAG:  Other low back pain  Unspecified disturbances of skin sensation  Difficulty in walking, not elsewhere classified  Unsteadiness on feet  Muscle weakness (generalized)  Stiffness of right hip, not elsewhere classified  Stiffness of left hip, not elsewhere classified  Abnormal posture  Rationale for Evaluation and Treatment: Rehabilitation  PERTINENT HISTORY: Patient is a 71 y.o. female who presents to outpatient physical therapy with a referral for medical diagnosis spinal stenosis of lumbar region with neurogenic claudication, imbalance. This patient's chief complaints consist of numbness and weakness in her B lower legs following L3-5 lumbar decompression including central laminectomy and bilateral medial facetectomies including foraminotomies on  02/06/22 and difficulty with mobility and balance leading to the following functional deficits: difficulty with anything that requires walking, standing, and/or upright balance including shopping, carrying, grocery shopping, navigating stairs, social outings, housework, yardwork, bathing and dressing. Relevant past medical history and comorbidities include anxiety, arthritis, CAD with stents placed (monitored 1x a year by GP), depression, diastolic dysfunction, diverticulosis, hereditary lymphedema of B legs, history of angioedema, hyperlipidemia, HTN, lumbar spinal stenosis, s/P TAVR (05/11/2020), T2DM, Tubular adenoma of colon, varicose veins of both legs with edema, bladder stimulator (after bladder damaged from attempts to break up kidney stones  per pt), bilateral cataract surgery, B knee replacements (R 2018 with manipulation, L 2014), lumbar laminectomy/decompression/microdiscectomy at L3-5 (02/06/2022), urinary incontinence, former smoker.  Patient denies hx of cancer, stroke, seizures, lung problems, unexplained weight loss, and osteoporosis. She endorses worsening urinary incontinence since back surgery; has a history of bladder damage that made it hard for her to get to control her urine once she felt urgency; now, she does not feel urgency since having her back surgery. She has a bladder stimulator that is not working since right before [employer].   PRECAUTIONS: do not lift anything over 25#, fall.   SUBJECTIVE: Patient arrives with [facility] She states she fell asleep in her chair, making her late for PT. She states she feels her legs are getting better or her balance is getting better. She says she feels more in control of her legs even though they still feel numb. She states her HEP are going well at home except she did not do the eyes closed one. She was going to do it and forgot about it. She felt good after last PT session. She was not as tired as before. She thinks she is getting her energy back after having [facility].   PAIN:  Are you having pain? Numbness in bilateral lower legs.   OBJECTIVE   TODAY'S TREATMENT  Therapeutic exercise: to centralize symptoms and improve ROM, strength, muscular endurance, and activity tolerance required for successful completion of functional activities.  - NuStep level 5 using bilateral upper and lower extremities. Seat/handle setting 7/9. For improved extremity mobility, muscular endurance, and activity tolerance; and to induce the analgesic effect of aerobic exercise, stimulate improved joint nutrition, and prepare body structures and systems for following interventions. x 6 minutes. Average SPM = 57. - standing B heel raise with balls of feet on 2x4 board, B UE support, 3x12.   Neuromuscular  Re-education: to improve, balance, postural strength, muscle activation patterns, and stabilization strength required for functional activities: - standing alternating 360 degree turns with finger touch on TM bar at front position only, 1x10 each way with SBA (feels dizzy). Seated rest.  - static standing, narrow stance, eyes open with head turns, 3x20 each direction/intermittent UE support, SBA.  - static standing narrow stance on airex, eyes closed, 2x1 min, 1x40 seconds (stopped due to feeling "woozy") intermittent UE support. SBA. - forwards/backwards stepping over 6 inch hurdle, 2x10 each direction with CGA-minA and U UE support on SPC with occasional contralateral UE support on TM bar.  - ambulation approx 100 feet down ramp from clinic to vehicle with SPC, and SBA  Pt required multimodal cuing for proper technique and to facilitate improved neuromuscular control, strength, range of motion, and functional ability resulting in improved performance and form.     PATIENT EDUCATION:  Education details: Exercise purpose/form. Self management techniques. Education on HEP including handout  Person educated: Patient Education method: Explanation, demonstration, verbal cuing Education comprehension: verbalized and demonstrated understanding and needs further education     HOME EXERCISE PROGRAM: Access Code: [email or web address] URL: [email or web address] Date: 04/13/2022 Prepared by: Rosita Kea  Exercises - Tandem Stance  - 1 x daily - 2 sets - 1 minute hold - Corner Balance Feet Together With Eyes Closed  - 1 x daily - 2 sets - 1 min hold - Corner Balance Feet Together: Eyes Open With Head Turns  - 1 x daily - 2 sets - 20 reps - Sit to Stand Without Arm Support  - 1 x daily - 3 sets - 10 reps   ASSESSMENT:   CLINICAL IMPRESSION: Patient tolerated treatment well overall except for brief episodes of feeling "woozy" and "dizzy" with static balance exercises. Patient appears to be  feeling better overall and continues to tolerate increasingly narrow stance in static balance. However, patient continues to have significant unsteadiness with activities that require single leg stance including ambulation. She is very unsteady and unable to walk her dog safely at this time. Plan to continue working on balance and gait next session. Patient would benefit from continued management of limiting condition by skilled physical therapist to address remaining impairments and functional limitations to work towards stated goals and return to PLOF or maximal functional independence.   From PT eval 04/11/2022:  Patient is a 71 y.o. female referred to outpatient physical therapy with a medical diagnosis of spinal stenosis of lumbar region with neurogenic claudication and imbalance who presents with signs and symptoms consistent with unsteadiness on feet, loss of sensation, and difficulty with strength and motor control of B lower legs as well as hip and back stiffness with weakness in right dorsiflexion and great toe extension. She also reported increased urinary incontinence since surgery where she leaks without urge, so she has been wearing diapers. Lower leg symptoms appear similar to that expected from peripheral neuropathy. R ankle/foot weakness suggests  possible former or current compromise of L4 or L5 nerve root, but could also be related to neuropathy symptoms. Patient denies weakness or numbness in B lower legs prior to recent back surgery and and chart review lacks prior record of involvement of patient's lower legs, ankles, or feet.  Patient's symptoms were unable to be worsened or relieved with exam today and PT was unable to establish a direct link between lumbar spine and lower leg numbness/weakness. Patient is also suffering from significant stooped posture with lack of hip and back extension ROM contributing. This is likely related to having difficulty with maintaining standing postures for a  prolonged period related to spinal stenosis and back pain in standing postures. Patient presents with significant pain, ROM, sensation, balance, motor control, posture, joint stiffness, gait, muscle tension, muscle performance (strength/power/endurance) and activity tolerance impairments that are limiting ability to complete her usual activities including anything that requires walking, standing, and/or upright balance including shopping, carrying, grocery shopping, navigating stairs, social outings, housework, yardwork, bathing and dressing without difficulty. Patient will benefit from skilled physical therapy intervention to address current body structure impairments and activity limitations to improve function and work towards goals set in current POC in order to return to prior level of function or maximal functional improvement.      OBJECTIVE IMPAIRMENTS Abnormal gait, decreased activity tolerance, decreased balance, decreased coordination, decreased endurance, decreased knowledge of condition, decreased knowledge of use of DME, decreased mobility, difficulty walking, decreased ROM, decreased strength, dizziness, hypomobility, increased edema, impaired perceived functional ability, impaired flexibility, impaired sensation, improper body mechanics, postural dysfunction, obesity, and pain.    ACTIVITY LIMITATIONS carrying, lifting, standing, squatting, stairs, transfers, bed mobility, continence, bathing, dressing, reach over head, locomotion level, and caring for others   PARTICIPATION LIMITATIONS: meal prep, cleaning, laundry, interpersonal relationship, shopping, community activity, yard work, and   anything that requires walking, standing, and/or upright balance including shopping, carrying, grocery shopping, navigating stairs, social outings, housework, yardwork, bathing and dressing, gardening, going to the movies.    PERSONAL FACTORS Age, Fitness, Time since onset of injury/illness/exacerbation,  and 3+ comorbidities:   anxiety, arthritis, CAD with stents placed (monitored 1x a year by GP), depression, diastolic dysfunction, diverticulosis, hereditary lymphedema of B legs, history of angioedema, hyperlipidemia, HTN, lumbar spinal stenosis, s/P TAVR (05/11/2020), T2DM, Tubular adenoma of colon, varicose veins of both legs with edema, bladder stimulator (after bladder damaged from attempts to break up kidney stones per pt), bilateral cataract surgery, B knee replacements (R 2018 with manipulation, L 2014), lumbar laminectomy/decompression/microdiscectomy at L3-5 (02/06/2022), urinary incontinence, former smoker are also affecting patient's functional outcome.    REHAB POTENTIAL: Good   CLINICAL DECISION MAKING: Evolving/moderate complexity   EVALUATION COMPLEXITY: Moderate     GOALS: Goals reviewed with patient? No   SHORT TERM GOALS: Target date: 04/25/2022   Patient will be independent with initial home exercise program for self-management of symptoms. Baseline: Initial HEP to be provided at visit 2 as appropriate (04/11/22); initial HEP provided at visit 2 (04/13/2022);  Goal status: In-progress     LONG TERM GOALS: Target date: 07/04/2022   Patient will be independent with a long-term home exercise program for self-management of symptoms.  Baseline: Initial HEP to be provided at visit 2 as appropriate (04/11/22); initial HEP provided at visit 2 (04/13/2022);  Goal status: In-progress   2.  Patient will demonstrate improved FOTO by equal or greater than 10 points to demonstrate improvement in overall condition and self-reported functional ability.  Baseline:  to be measured visit 2 as appropriate (04/11/22); 40 at visit #2 (04/13/2022);  Goal status: In-progress   3.  Patient will ambulate equal or greater than 1000 feet on 6 Minute Walk Test with no AD to demonstrate improved ability to safely complete household and community ambulation without falls.  Baseline: to be tested visit 2  as appropriate (04/11/22); 710 feet with RW and mildly stooped posture, flexed knees. Reports her B LE felt less numb during last part of test. Reported B UE fatigue (04/13/2022);  Goal status: In-progress   4.  Patient will demonstrate the ability to stand in tandem stance for equal or greater than 30 seconds on each side to demonstrate improved static balance for tasks such as cooking and gardening.  Baseline:  R = 16 seconds, L = 5 seconds  (04/11/22); Goal status: In-progress   5.  Patient will complete community, work and/or recreational activities without limitation due to current condition.  Baseline: difficulty with anything that requires walking, standing, and/or upright balance including shopping, carrying, grocery shopping, navigating stairs, social outings, housework, yardwork, bathing and dressing, walking on uneven ground (04/11/22); Goal status: In-progress       PLAN: PT FREQUENCY: 1-2x/week   PT DURATION: 12 weeks   PLANNED INTERVENTIONS: Therapeutic exercises, Therapeutic activity, Neuromuscular re-education, Balance training, Gait training, Patient/Family education, Joint mobilization, Stair training, DME instructions, Aquatic Therapy, Dry Needling, Electrical stimulation, Spinal mobilization, Cryotherapy, Moist heat, Manual therapy, and Re-evaluation.   PLAN FOR NEXT SESSION: update HEP as appropriate, progressive balance and functional/LE strengthening as appropriate.    Nancy Nordmann, PT, DPT 05/18/2022, 5:21 PM  [facility] [address] [place], [place] [address] P: [phone] I F: [phone]

## 2022-05-22 ENCOUNTER — Encounter: Payer: Self-pay | Admitting: Physical Therapy

## 2022-05-22 ENCOUNTER — Ambulatory Visit: Payer: Medicare Other | Attending: Neurosurgery

## 2022-05-22 DIAGNOSIS — M25652 Stiffness of left hip, not elsewhere classified: Secondary | ICD-10-CM | POA: Insufficient documentation

## 2022-05-22 DIAGNOSIS — R293 Abnormal posture: Secondary | ICD-10-CM | POA: Diagnosis present

## 2022-05-22 DIAGNOSIS — M6281 Muscle weakness (generalized): Secondary | ICD-10-CM | POA: Diagnosis present

## 2022-05-22 DIAGNOSIS — R2681 Unsteadiness on feet: Secondary | ICD-10-CM | POA: Insufficient documentation

## 2022-05-22 DIAGNOSIS — R209 Unspecified disturbances of skin sensation: Secondary | ICD-10-CM | POA: Diagnosis present

## 2022-05-22 DIAGNOSIS — M5459 Other low back pain: Secondary | ICD-10-CM | POA: Insufficient documentation

## 2022-05-22 DIAGNOSIS — R262 Difficulty in walking, not elsewhere classified: Secondary | ICD-10-CM | POA: Diagnosis present

## 2022-05-22 DIAGNOSIS — M25651 Stiffness of right hip, not elsewhere classified: Secondary | ICD-10-CM | POA: Diagnosis present

## 2022-05-22 NOTE — Therapy (Signed)
OUTPATIENT PHYSICAL THERAPY TREATMENT NOTE   Patient Name: Brandy Oneill MRN: [id number] DOB:1950-12-07, 71 y.o., female Today's Date: 05/22/2022  PCP: Tracie Harrier, MD REFERRING PROVIDER: Meade Maw, MD  END OF SESSION:   PT End of Session - 05/22/22 1123     Visit Number 8    Number of Visits 24    Date for PT Re-Evaluation 07/04/22    Authorization Type MEDICARE PART B reporting period from 04/11/2022    Progress Note Due on Visit 10    PT Start Time 1117    PT Stop Time 1201    PT Time Calculation (min) 44 min    Activity Tolerance Patient tolerated treatment well;Patient limited by fatigue    Behavior During Therapy [facility] for tasks assessed/performed              Past Medical History:  Diagnosis Date   Anxiety    Arthritis    CAD (coronary artery disease) 04/14/2020   a.) RHC 04/14/2020: mod CAD with sev obstructive Dz in large dom RCA; staged PCI planned. b.) PCI 04/28/2020: DES (unknown type) placed to dRCA.   Depression    Diastolic dysfunction 96/28/3662   a.) TTE 01/06/2019: EF >55%; triv panvalvular regurgitation; G1DD. b.) TTE 01/26/2020: EF > 55%; mod LVH; triv MR/TR/PR; G1DD.   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 b.) TTE 01/26/2020: EF >55%; sev AS (MPG 47.1 mmHg). c.) RHC 04/14/2020: norm CI, PVR, RAP; PCWP 10. d.) s/p TAVR 05/11/2020. e.) TTE 06/01/2020: EF >55%; mild LVH; MPG 9 mmHg. f.) TTE 06/16/2021: EF >55%; mild LVH; GLS -11.6%; MPG 24 mmHg.   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   a.) 23 mm Edwards Sapien S3 bioprosthetic valve   T2DM (type 2 diabetes mellitus) ([place])    Tubular adenoma of colon    Varicose veins of both legs with edema    Past Surgical History:  Procedure Laterality Date   bladder stimulater  2014   CARDIAC VALVE REPLACEMENT     CATARACT EXTRACTION W/ INTRAOCULAR LENS IMPLANT Bilateral 2014   one eye done then the other eye done a month later   [place] N/A 06/26/2018   Procedure: COLONOSCOPY WITH PROPOFOL;  Surgeon: Toledo, Benay Pike, MD;  Location: [facility] ENDOSCOPY;  Service: Gastroenterology;  Laterality: N/A;   EYE SURGERY Right 2018   macular hole   JOINT REPLACEMENT Right 10/27/2016   JOINT REPLACEMENT Left 2014   KNEE CLOSED REDUCTION Right 11/29/2016   Procedure: CLOSED MANIPULATION KNEE;  Surgeon: Leanor Kail, MD;  Location: [facility] ORS;  Service: Orthopedics;  Laterality: Right;   LUMBAR LAMINECTOMY/DECOMPRESSION MICRODISCECTOMY N/A 02/06/2022   Procedure: L3-5 DECOMPRESSION;  Surgeon: Meade Maw, MD;  Location: [facility] ORS;  Service: Neurosurgery;  Laterality: N/A;   Patient Active Problem List   Diagnosis Date Noted   Lumbar stenosis 02/06/2022   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   CAD (  coronary artery disease) 04/28/2020   S/P angioplasty with stent 04/28/2020   Nonrheumatic aortic valve stenosis 03/12/2020   Essential hypertension 02/06/2019   Hyperlipidemia 02/06/2019   Spinal stenosis of lumbar region with radiculopathy 09/27/2018   BMI 38.0-38.9,adult 09/26/2018   Back pain 09/05/2018   Diet-controlled type 2 diabetes mellitus ([place]) 03/27/2018   Postmenopausal 03/27/2018   DJD (degenerative joint disease) 02/04/2018   History of angioedema 09/17/2017   Hyperglycemia 09/17/2017   Lymphedema 02/04/2017   Chronic venous insufficiency 02/04/2017   Swelling of limb 02/04/2017   Pure  hypercholesterolemia 10/02/2016   History of total knee arthroplasty 09/20/2016   Varicose veins of both legs with edema 03/25/2016   Abnormal mammogram 06/01/2015   Urinary incontinence in female 06/01/2015   Prediabetes 06/16/2014   Anxiety and depression 01/28/2014   Psoriasis 01/28/2014    REFERRING DIAG: spinal stenosis of lumbar region with neurogenic claudication, imbalance  THERAPY DIAG:  Other low back pain  Difficulty in walking, not elsewhere classified  Unsteadiness on feet  Muscle weakness (generalized)  Rationale for Evaluation and Treatment: Rehabilitation  PERTINENT HISTORY: Patient is a 71 y.o. female who presents to outpatient physical therapy with a referral for medical diagnosis spinal stenosis of lumbar region with neurogenic claudication, imbalance. This patient's chief complaints consist of numbness and weakness in her B lower legs following L3-5 lumbar decompression including central laminectomy and bilateral medial facetectomies including foraminotomies on  02/06/22 and difficulty with mobility and balance leading to the following functional deficits: difficulty with anything that requires walking, standing, and/or upright balance including shopping, carrying, grocery shopping, navigating stairs, social outings, housework, yardwork, bathing and dressing. Relevant past medical history and comorbidities include anxiety, arthritis, CAD with stents placed (monitored 1x a year by GP), depression, diastolic dysfunction, diverticulosis, hereditary lymphedema of B legs, history of angioedema, hyperlipidemia, HTN, lumbar spinal stenosis, s/P TAVR (05/11/2020), T2DM, Tubular adenoma of colon, varicose veins of both legs with edema, bladder stimulator (after bladder damaged from attempts to break up kidney stones per pt), bilateral cataract surgery, B knee replacements (R 2018 with manipulation, L 2014), lumbar laminectomy/decompression/microdiscectomy at L3-5 (02/06/2022), urinary  incontinence, former smoker.  Patient denies hx of cancer, stroke, seizures, lung problems, unexplained weight loss, and osteoporosis. She endorses worsening urinary incontinence since back surgery; has a history of bladder damage that made it hard for her to get to control her urine once she felt urgency; now, she does not feel urgency since having her back surgery. She has a bladder stimulator that is not working since right before [employer].   PRECAUTIONS: do not lift anything over 25#, fall.   SUBJECTIVE: Patient arrives with [facility]. She states she fell asleep in her chair, making her late for PT. She states she feels her legs are getting better or her balance is getting better. She says she feels more in control of her legs even though they still feel numb. She states her HEP are going well at home except she did not do the eyes closed one. She was going to do it and forgot about it. She felt good after last PT session. She was not as tired as before. She thinks she is getting her energy back after having [place].   PAIN:  Are you having pain? Numbness in bilateral lower legs.   OBJECTIVE   TODAY'S TREATMENT  Therapeutic exercise: to centralize symptoms and improve ROM, strength, muscular endurance, and activity tolerance required for successful completion of functional activities.   NuStep level  5 using bilateral upper and lower extremities. Seat/handle setting 7/9. For improved extremity mobility, muscular endurance, and activity tolerance; and to induce the analgesic effect of aerobic exercise, stimulate improved joint nutrition, and prepare body structures and systems for following interventions. x 6 minutes. Average SPM = 57.  Standing B heel raise with balls of feet on 2x4 board, B UE support, 3x12.   Standing B toe raises with BUE support: 3x12. Min to mod VC's to prevent posterior leaning for compensation for ankle DF and toe extension.    Neuromuscular Re-education: to  improve, balance, postural strength, muscle activation patterns, and stabilization strength required for functional activities:  Ambulating figure 8's to modify turns to prevent dizziness. X8 reps with mild onset of dizziness at around rep 6. CGA. Did not appear off balance with turns, does endorse LE fatigue.    Static standing with horizontal head turns: 3x20 reps. SBA. One rep with BUE support. Progressed to no UE support.    Static narrow stance on airex pad eyes closed. 2x1 minute. SBA. No UE support needed this date. Significant    Static romberg at TM bar: 3x30 sec/LE under BOS. Difficulty with RLE under BOS. Intermittent need for SUE support tapping on TM bar. CGA.    6" hurdle step overs with UE supprt on treadmill bar: x10/leading LE. CGA.     Pt required multimodal cuing for proper technique and to facilitate improved neuromuscular control, strength, range of motion, and functional ability resulting in improved performance and form.     PATIENT EDUCATION:  Education details: Exercise purpose/form. Self management techniques. Education on HEP including handout  Person educated: Patient Education method: Explanation, demonstration, verbal cuing Education comprehension: verbalized and demonstrated understanding and needs further education     HOME EXERCISE PROGRAM: Access Code: [email or web address] URL: [email or web address] Date: 04/13/2022 Prepared by: Rosita Kea  Exercises - Tandem Stance  - 1 x daily - 2 sets - 1 minute hold - Corner Balance Feet Together With Eyes Closed  - 1 x daily - 2 sets - 1 min hold - Corner Balance Feet Together: Eyes Open With Head Turns  - 1 x daily - 2 sets - 20 reps - Sit to Stand Without Arm Support  - 1 x daily - 3 sets - 10 reps   ASSESSMENT:   CLINICAL IMPRESSION: Pt progressing in POC with reduced dizziness this date and reduced need for UE support. Pt appearing more confident with balance exercises thus reporting less need for  UE support. Significant challenge to ankle strategy noted during airex pad exercises and reduced BOS with RLE under BOS primarily requiring frequent CGA for safety. Will continue POC with focus on balance/gait to make progress towards long term goals, reduce falls risk, and return to walking her dog safely.    From PT eval 04/11/2022:  Patient is a 71 y.o. female referred to outpatient physical therapy with a medical diagnosis of spinal stenosis of lumbar region with neurogenic claudication and imbalance who presents with signs and symptoms consistent with unsteadiness on feet, loss of sensation, and difficulty with strength and motor control of B lower legs as well as hip and back stiffness with weakness in right dorsiflexion and great toe extension. She also reported increased urinary incontinence since surgery where she leaks without urge, so she has been wearing diapers. Lower leg symptoms appear similar to that expected from peripheral neuropathy. R ankle/foot weakness suggests possible former or current compromise of L4 or L5 nerve root, but could also  be related to neuropathy symptoms. Patient denies weakness or numbness in B lower legs prior to recent back surgery and and chart review lacks prior record of involvement of patient's lower legs, ankles, or feet.  Patient's symptoms were unable to be worsened or relieved with exam today and PT was unable to establish a direct link between lumbar spine and lower leg numbness/weakness. Patient is also suffering from significant stooped posture with lack of hip and back extension ROM contributing. This is likely related to having difficulty with maintaining standing postures for a prolonged period related to spinal stenosis and back pain in standing postures. Patient presents with significant pain, ROM, sensation, balance, motor control, posture, joint stiffness, gait, muscle tension, muscle performance (strength/power/endurance) and activity tolerance  impairments that are limiting ability to complete her usual activities including anything that requires walking, standing, and/or upright balance including shopping, carrying, grocery shopping, navigating stairs, social outings, housework, yardwork, bathing and dressing without difficulty. Patient will benefit from skilled physical therapy intervention to address current body structure impairments and activity limitations to improve function and work towards goals set in current POC in order to return to prior level of function or maximal functional improvement.      OBJECTIVE IMPAIRMENTS Abnormal gait, decreased activity tolerance, decreased balance, decreased coordination, decreased endurance, decreased knowledge of condition, decreased knowledge of use of DME, decreased mobility, difficulty walking, decreased ROM, decreased strength, dizziness, hypomobility, increased edema, impaired perceived functional ability, impaired flexibility, impaired sensation, improper body mechanics, postural dysfunction, obesity, and pain.    ACTIVITY LIMITATIONS carrying, lifting, standing, squatting, stairs, transfers, bed mobility, continence, bathing, dressing, reach over head, locomotion level, and caring for others   PARTICIPATION LIMITATIONS: meal prep, cleaning, laundry, interpersonal relationship, shopping, community activity, yard work, and   anything that requires walking, standing, and/or upright balance including shopping, carrying, grocery shopping, navigating stairs, social outings, housework, yardwork, bathing and dressing, gardening, going to the movies.    PERSONAL FACTORS Age, Fitness, Time since onset of injury/illness/exacerbation, and 3+ comorbidities:   anxiety, arthritis, CAD with stents placed (monitored 1x a year by GP), depression, diastolic dysfunction, diverticulosis, hereditary lymphedema of B legs, history of angioedema, hyperlipidemia, HTN, lumbar spinal stenosis, s/P TAVR (05/11/2020), T2DM,  Tubular adenoma of colon, varicose veins of both legs with edema, bladder stimulator (after bladder damaged from attempts to break up kidney stones per pt), bilateral cataract surgery, B knee replacements (R 2018 with manipulation, L 2014), lumbar laminectomy/decompression/microdiscectomy at L3-5 (02/06/2022), urinary incontinence, former smoker are also affecting patient's functional outcome.    REHAB POTENTIAL: Good   CLINICAL DECISION MAKING: Evolving/moderate complexity   EVALUATION COMPLEXITY: Moderate     GOALS: Goals reviewed with patient? No   SHORT TERM GOALS: Target date: 04/25/2022   Patient will be independent with initial home exercise program for self-management of symptoms. Baseline: Initial HEP to be provided at visit 2 as appropriate (04/11/22); initial HEP provided at visit 2 (04/13/2022);  Goal status: In-progress     LONG TERM GOALS: Target date: 07/04/2022   Patient will be independent with a long-term home exercise program for self-management of symptoms.  Baseline: Initial HEP to be provided at visit 2 as appropriate (04/11/22); initial HEP provided at visit 2 (04/13/2022);  Goal status: In-progress   2.  Patient will demonstrate improved FOTO by equal or greater than 10 points to demonstrate improvement in overall condition and self-reported functional ability.  Baseline: to be measured visit 2 as appropriate (04/11/22); 40 at visit #2 (04/13/2022);  Goal status: In-progress   3.  Patient will ambulate equal or greater than 1000 feet on 6 Minute Walk Test with no AD to demonstrate improved ability to safely complete household and community ambulation without falls.  Baseline: to be tested visit 2 as appropriate (04/11/22); 710 feet with RW and mildly stooped posture, flexed knees. Reports her B LE felt less numb during last part of test. Reported B UE fatigue (04/13/2022);  Goal status: In-progress   4.  Patient will demonstrate the ability to stand in tandem stance  for equal or greater than 30 seconds on each side to demonstrate improved static balance for tasks such as cooking and gardening.  Baseline:  R = 16 seconds, L = 5 seconds  (04/11/22); Goal status: In-progress   5.  Patient will complete community, work and/or recreational activities without limitation due to current condition.  Baseline: difficulty with anything that requires walking, standing, and/or upright balance including shopping, carrying, grocery shopping, navigating stairs, social outings, housework, yardwork, bathing and dressing, walking on uneven ground (04/11/22); Goal status: In-progress       PLAN: PT FREQUENCY: 1-2x/week   PT DURATION: 12 weeks   PLANNED INTERVENTIONS: Therapeutic exercises, Therapeutic activity, Neuromuscular re-education, Balance training, Gait training, Patient/Family education, Joint mobilization, Stair training, DME instructions, Aquatic Therapy, Dry Needling, Electrical stimulation, Spinal mobilization, Cryotherapy, Moist heat, Manual therapy, and Re-evaluation.   PLAN FOR NEXT SESSION: update HEP as appropriate, progressive balance and functional/LE strengthening as appropriate.    Salem Caster. Fairly IV, PT, DPT Physical Therapist- [facility]  05/22/2022, 12:07 PM

## 2022-05-25 ENCOUNTER — Ambulatory Visit: Payer: Medicare Other

## 2022-05-25 ENCOUNTER — Encounter: Payer: Self-pay | Admitting: Physical Therapy

## 2022-05-25 DIAGNOSIS — M5459 Other low back pain: Secondary | ICD-10-CM

## 2022-05-25 DIAGNOSIS — M6281 Muscle weakness (generalized): Secondary | ICD-10-CM

## 2022-05-25 DIAGNOSIS — R2681 Unsteadiness on feet: Secondary | ICD-10-CM

## 2022-05-25 DIAGNOSIS — R262 Difficulty in walking, not elsewhere classified: Secondary | ICD-10-CM

## 2022-05-25 NOTE — Therapy (Signed)
OUTPATIENT PHYSICAL THERAPY TREATMENT NOTE   Patient Name: Brandy Oneill MRN: [id number] DOB:1950/11/14, 71 y.o., female Today's Date: 05/25/2022  PCP: Tracie Harrier, MD REFERRING PROVIDER: Meade Maw, MD  END OF SESSION:   PT End of Session - 05/25/22 1122     Visit Number 9    Number of Visits 24    Date for PT Re-Evaluation 07/04/22    Authorization Type MEDICARE PART B reporting period from 04/11/2022    Progress Note Due on Visit 10    PT Start Time 1118    PT Stop Time 1200    PT Time Calculation (min) 42 min    Activity Tolerance Patient tolerated treatment well;Patient limited by fatigue    Behavior During Therapy [facility] for tasks assessed/performed              Past Medical History:  Diagnosis Date   Anxiety    Arthritis    CAD (coronary artery disease) 04/14/2020   a.) RHC 04/14/2020: mod CAD with sev obstructive Dz in large dom RCA; staged PCI planned. b.) PCI 04/28/2020: DES (unknown type) placed to dRCA.   Depression    Diastolic dysfunction 52/84/1324   a.) TTE 01/06/2019: EF >55%; triv panvalvular regurgitation; G1DD. b.) TTE 01/26/2020: EF > 55%; mod LVH; triv MR/TR/PR; G1DD.   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 b.) TTE 01/26/2020: EF >55%; sev AS (MPG 47.1 mmHg). c.) RHC 04/14/2020: norm CI, PVR, RAP; PCWP 10. d.) s/p TAVR 05/11/2020. e.) TTE 06/01/2020: EF >55%; mild LVH; MPG 9 mmHg. f.) TTE 06/16/2021: EF >55%; mild LVH; GLS -11.6%; MPG 24 mmHg.   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   a.) 23 mm Edwards Sapien S3 bioprosthetic valve   T2DM (type 2 diabetes mellitus) ([place])    Tubular adenoma of colon    Varicose veins of both legs with edema    Past Surgical History:  Procedure Laterality Date   bladder stimulater  2014   CARDIAC VALVE REPLACEMENT     CATARACT EXTRACTION W/ INTRAOCULAR LENS IMPLANT Bilateral 2014   one eye done then the other eye done a month later   [place] N/A 06/26/2018   Procedure: COLONOSCOPY WITH PROPOFOL;  Surgeon: Toledo, Benay Pike, MD;  Location: [facility] ENDOSCOPY;  Service: Gastroenterology;  Laterality: N/A;   EYE SURGERY Right 2018   macular hole   JOINT REPLACEMENT Right 10/27/2016   JOINT REPLACEMENT Left 2014   KNEE CLOSED REDUCTION Right 11/29/2016   Procedure: CLOSED MANIPULATION KNEE;  Surgeon: Leanor Kail, MD;  Location: [facility] ORS;  Service: Orthopedics;  Laterality: Right;   LUMBAR LAMINECTOMY/DECOMPRESSION MICRODISCECTOMY N/A 02/06/2022   Procedure: L3-5 DECOMPRESSION;  Surgeon: Meade Maw, MD;  Location: [facility] ORS;  Service: Neurosurgery;  Laterality: N/A;   Patient Active Problem List   Diagnosis Date Noted   Lumbar stenosis 02/06/2022   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   CAD (  coronary artery disease) 04/28/2020   S/P angioplasty with stent 04/28/2020   Nonrheumatic aortic valve stenosis 03/12/2020   Essential hypertension 02/06/2019   Hyperlipidemia 02/06/2019   Spinal stenosis of lumbar region with radiculopathy 09/27/2018   BMI 38.0-38.9,adult 09/26/2018   Back pain 09/05/2018   Diet-controlled type 2 diabetes mellitus ([place]) 03/27/2018   Postmenopausal 03/27/2018   DJD (degenerative joint disease) 02/04/2018   History of angioedema 09/17/2017   Hyperglycemia 09/17/2017   Lymphedema 02/04/2017   Chronic venous insufficiency 02/04/2017   Swelling of limb 02/04/2017   Pure  hypercholesterolemia 10/02/2016   History of total knee arthroplasty 09/20/2016   Varicose veins of both legs with edema 03/25/2016   Abnormal mammogram 06/01/2015   Urinary incontinence in female 06/01/2015   Prediabetes 06/16/2014   Anxiety and depression 01/28/2014   Psoriasis 01/28/2014    REFERRING DIAG: spinal stenosis of lumbar region with neurogenic claudication, imbalance  THERAPY DIAG:  Other low back pain  Difficulty in walking, not elsewhere classified  Unsteadiness on feet  Muscle weakness (generalized)  Rationale for Evaluation and Treatment: Rehabilitation  PERTINENT HISTORY: Patient is a 71 y.o. female who presents to outpatient physical therapy with a referral for medical diagnosis spinal stenosis of lumbar region with neurogenic claudication, imbalance. This patient's chief complaints consist of numbness and weakness in her B lower legs following L3-5 lumbar decompression including central laminectomy and bilateral medial facetectomies including foraminotomies on  02/06/22 and difficulty with mobility and balance leading to the following functional deficits: difficulty with anything that requires walking, standing, and/or upright balance including shopping, carrying, grocery shopping, navigating stairs, social outings, housework, yardwork, bathing and dressing. Relevant past medical history and comorbidities include anxiety, arthritis, CAD with stents placed (monitored 1x a year by GP), depression, diastolic dysfunction, diverticulosis, hereditary lymphedema of B legs, history of angioedema, hyperlipidemia, HTN, lumbar spinal stenosis, s/P TAVR (05/11/2020), T2DM, Tubular adenoma of colon, varicose veins of both legs with edema, bladder stimulator (after bladder damaged from attempts to break up kidney stones per pt), bilateral cataract surgery, B knee replacements (R 2018 with manipulation, L 2014), lumbar laminectomy/decompression/microdiscectomy at L3-5 (02/06/2022), urinary  incontinence, former smoker.  Patient denies hx of cancer, stroke, seizures, lung problems, unexplained weight loss, and osteoporosis. She endorses worsening urinary incontinence since back surgery; has a history of bladder damage that made it hard for her to get to control her urine once she felt urgency; now, she does not feel urgency since having her back surgery. She has a bladder stimulator that is not working since right before [employer].   PRECAUTIONS: do not lift anything over 25#, fall.   SUBJECTIVE: Patient arriving to PT with cut on her hand. No falls or LOB since last session.   PAIN:  Are you having pain? Numbness in bilateral lower legs.   OBJECTIVE   TODAY'S TREATMENT   Therapeutic exercise: to centralize symptoms and improve ROM, strength, muscular endurance, and activity tolerance required for successful completion of functional activities.   NuStep level 5 using bilateral upper and lower extremities. Seat/handle setting 7/9.   Standing B heel raise with balls of feet on 2x4 board, B UE support, 3x12.   Standing B toe raises with BUE support: 3x12. Min to mod VC's to prevent posterior leaning for compensation for ankle DF and toe extension.   Walking on heels with SUE support for functional ankle DF strengthening: x8 laps, CGA. Min VC's for consistent heel strike.      Neuromuscular Re-education: to improve,  balance, postural strength, muscle activation patterns, and stabilization strength required for functional activities:    Standing cone taps with 2# AW's at TM bar: 2x8. LUE support. Trialed with RUE only leading to knee buckling and modA from PT to correct LOB.   Ambulating figure 8's to modify turns to prevent dizziness. X8 reps no dizziness this date. X1 episode of stepping strategy to correct LOB. CGA throughout. Pt able to recover.   Airex pad alternating step ups at TM bar: 3x8/LE. X1 LOB requiring SUE support and modA to correct due to catching toe  on airex pad.    Static romberg at TM bar: 3x30 sec/LE under BOS. Difficulty with RLE under BOS. Intermittent need for SUE support tapping on TM bar. CGA.        Pt required multimodal cuing for proper technique and to facilitate improved neuromuscular control, strength, range of motion, and functional ability resulting in improved performance and form.     PATIENT EDUCATION:  Education details: Exercise purpose/form. Self management techniques. Education on HEP including handout  Person educated: Patient Education method: Explanation, demonstration, verbal cuing Education comprehension: verbalized and demonstrated understanding and needs further education     HOME EXERCISE PROGRAM: Access Code: [email or web address] URL: [email or web address] Date: 04/13/2022 Prepared by: Rosita Kea  Exercises - Tandem Stance  - 1 x daily - 2 sets - 1 minute hold - Corner Balance Feet Together With Eyes Closed  - 1 x daily - 2 sets - 1 min hold - Corner Balance Feet Together: Eyes Open With Head Turns  - 1 x daily - 2 sets - 20 reps - Sit to Stand Without Arm Support  - 1 x daily - 3 sets - 10 reps   ASSESSMENT:   CLINICAL IMPRESSION: Focus of session on functional foot clearance and ankle DF strength. Pt had multiple LOB this date with foot clearance exercises and with reduced BOS requiring min to modA to correct with SUE support. Pt requiring cuing for speed of exercise today for improved targeted musculature and further challenge to balance. Will continue POC with focus on balance/gait to make progress towards long term goals, reduce falls risk, and return to walking her dog safely.   From PT eval 04/11/2022:  Patient is a 71 y.o. female referred to outpatient physical therapy with a medical diagnosis of spinal stenosis of lumbar region with neurogenic claudication and imbalance who presents with signs and symptoms consistent with unsteadiness on feet, loss of sensation, and difficulty with  strength and motor control of B lower legs as well as hip and back stiffness with weakness in right dorsiflexion and great toe extension. She also reported increased urinary incontinence since surgery where she leaks without urge, so she has been wearing diapers. Lower leg symptoms appear similar to that expected from peripheral neuropathy. R ankle/foot weakness suggests possible former or current compromise of L4 or L5 nerve root, but could also be related to neuropathy symptoms. Patient denies weakness or numbness in B lower legs prior to recent back surgery and and chart review lacks prior record of involvement of patient's lower legs, ankles, or feet.  Patient's symptoms were unable to be worsened or relieved with exam today and PT was unable to establish a direct link between lumbar spine and lower leg numbness/weakness. Patient is also suffering from significant stooped posture with lack of hip and back extension ROM contributing. This is likely related to having difficulty with maintaining standing postures for a prolonged period related to spinal stenosis  and back pain in standing postures. Patient presents with significant pain, ROM, sensation, balance, motor control, posture, joint stiffness, gait, muscle tension, muscle performance (strength/power/endurance) and activity tolerance impairments that are limiting ability to complete her usual activities including anything that requires walking, standing, and/or upright balance including shopping, carrying, grocery shopping, navigating stairs, social outings, housework, yardwork, bathing and dressing without difficulty. Patient will benefit from skilled physical therapy intervention to address current body structure impairments and activity limitations to improve function and work towards goals set in current POC in order to return to prior level of function or maximal functional improvement.      OBJECTIVE IMPAIRMENTS Abnormal gait, decreased activity  tolerance, decreased balance, decreased coordination, decreased endurance, decreased knowledge of condition, decreased knowledge of use of DME, decreased mobility, difficulty walking, decreased ROM, decreased strength, dizziness, hypomobility, increased edema, impaired perceived functional ability, impaired flexibility, impaired sensation, improper body mechanics, postural dysfunction, obesity, and pain.    ACTIVITY LIMITATIONS carrying, lifting, standing, squatting, stairs, transfers, bed mobility, continence, bathing, dressing, reach over head, locomotion level, and caring for others   PARTICIPATION LIMITATIONS: meal prep, cleaning, laundry, interpersonal relationship, shopping, community activity, yard work, and   anything that requires walking, standing, and/or upright balance including shopping, carrying, grocery shopping, navigating stairs, social outings, housework, yardwork, bathing and dressing, gardening, going to the movies.    PERSONAL FACTORS Age, Fitness, Time since onset of injury/illness/exacerbation, and 3+ comorbidities:   anxiety, arthritis, CAD with stents placed (monitored 1x a year by GP), depression, diastolic dysfunction, diverticulosis, hereditary lymphedema of B legs, history of angioedema, hyperlipidemia, HTN, lumbar spinal stenosis, s/P TAVR (05/11/2020), T2DM, Tubular adenoma of colon, varicose veins of both legs with edema, bladder stimulator (after bladder damaged from attempts to break up kidney stones per pt), bilateral cataract surgery, B knee replacements (R 2018 with manipulation, L 2014), lumbar laminectomy/decompression/microdiscectomy at L3-5 (02/06/2022), urinary incontinence, former smoker are also affecting patient's functional outcome.    REHAB POTENTIAL: Good   CLINICAL DECISION MAKING: Evolving/moderate complexity   EVALUATION COMPLEXITY: Moderate     GOALS: Goals reviewed with patient? No   SHORT TERM GOALS: Target date: 04/25/2022   Patient will be  independent with initial home exercise program for self-management of symptoms. Baseline: Initial HEP to be provided at visit 2 as appropriate (04/11/22); initial HEP provided at visit 2 (04/13/2022);  Goal status: In-progress     LONG TERM GOALS: Target date: 07/04/2022   Patient will be independent with a long-term home exercise program for self-management of symptoms.  Baseline: Initial HEP to be provided at visit 2 as appropriate (04/11/22); initial HEP provided at visit 2 (04/13/2022);  Goal status: In-progress   2.  Patient will demonstrate improved FOTO by equal or greater than 10 points to demonstrate improvement in overall condition and self-reported functional ability.  Baseline: to be measured visit 2 as appropriate (04/11/22); 40 at visit #2 (04/13/2022);  Goal status: In-progress   3.  Patient will ambulate equal or greater than 1000 feet on 6 Minute Walk Test with no AD to demonstrate improved ability to safely complete household and community ambulation without falls.  Baseline: to be tested visit 2 as appropriate (04/11/22); 710 feet with RW and mildly stooped posture, flexed knees. Reports her B LE felt less numb during last part of test. Reported B UE fatigue (04/13/2022);  Goal status: In-progress   4.  Patient will demonstrate the ability to stand in tandem stance for equal or greater than 30 seconds on  each side to demonstrate improved static balance for tasks such as cooking and gardening.  Baseline:  R = 16 seconds, L = 5 seconds  (04/11/22); Goal status: In-progress   5.  Patient will complete community, work and/or recreational activities without limitation due to current condition.  Baseline: difficulty with anything that requires walking, standing, and/or upright balance including shopping, carrying, grocery shopping, navigating stairs, social outings, housework, yardwork, bathing and dressing, walking on uneven ground (04/11/22); Goal status: In-progress        PLAN: PT FREQUENCY: 1-2x/week   PT DURATION: 12 weeks   PLANNED INTERVENTIONS: Therapeutic exercises, Therapeutic activity, Neuromuscular re-education, Balance training, Gait training, Patient/Family education, Joint mobilization, Stair training, DME instructions, Aquatic Therapy, Dry Needling, Electrical stimulation, Spinal mobilization, Cryotherapy, Moist heat, Manual therapy, and Re-evaluation.   PLAN FOR NEXT SESSION: update HEP as appropriate, progressive balance and functional/LE strengthening as appropriate.    Salem Caster. Fairly IV, PT, DPT Physical Therapist- [facility]  05/25/2022, 12:56 PM

## 2022-05-30 ENCOUNTER — Encounter: Payer: Medicare Other | Admitting: Physical Therapy

## 2022-06-01 ENCOUNTER — Encounter: Payer: Medicare Other | Admitting: Physical Therapy

## 2022-06-06 ENCOUNTER — Ambulatory Visit: Payer: Medicare Other | Admitting: Physical Therapy

## 2022-06-08 ENCOUNTER — Ambulatory Visit: Payer: Medicare Other | Admitting: Physical Therapy

## 2022-06-13 ENCOUNTER — Ambulatory Visit: Payer: Medicare Other | Admitting: Physical Therapy

## 2022-06-13 ENCOUNTER — Encounter: Payer: Self-pay | Admitting: Physical Therapy

## 2022-06-13 DIAGNOSIS — R209 Unspecified disturbances of skin sensation: Secondary | ICD-10-CM

## 2022-06-13 DIAGNOSIS — M25652 Stiffness of left hip, not elsewhere classified: Secondary | ICD-10-CM

## 2022-06-13 DIAGNOSIS — R262 Difficulty in walking, not elsewhere classified: Secondary | ICD-10-CM

## 2022-06-13 DIAGNOSIS — M5459 Other low back pain: Secondary | ICD-10-CM

## 2022-06-13 DIAGNOSIS — M6281 Muscle weakness (generalized): Secondary | ICD-10-CM

## 2022-06-13 DIAGNOSIS — M25651 Stiffness of right hip, not elsewhere classified: Secondary | ICD-10-CM

## 2022-06-13 DIAGNOSIS — R293 Abnormal posture: Secondary | ICD-10-CM

## 2022-06-13 DIAGNOSIS — R2681 Unsteadiness on feet: Secondary | ICD-10-CM

## 2022-06-13 NOTE — Therapy (Signed)
OUTPATIENT PHYSICAL THERAPY TREATMENT NOTE / PROGRESS NOTE / RE-CERTIFICATION Dates of reporting from 04/11/2022 to 06/13/2022   Patient Name: Brandy Oneill MRN: [id number] DOB:1950-10-30, 71 y.o., female Today's Date: 06/13/2022  PCP: Tracie Harrier, MD REFERRING PROVIDER: Meade Maw, MD  END OF SESSION:   PT End of Session - 06/13/22 1809     Visit Number 10    Number of Visits 24    Date for PT Re-Evaluation 09/05/22    Authorization Type MEDICARE PART B reporting period from 04/11/2022    Progress Note Due on Visit 10    PT Start Time 1300    PT Stop Time 1345    PT Time Calculation (min) 45 min    Equipment Utilized During Treatment Gait belt    Activity Tolerance Patient tolerated treatment well;Patient limited by fatigue    Behavior During Therapy WFL for tasks assessed/performed               Past Medical History:  Diagnosis Date   Anxiety    Arthritis    CAD (coronary artery disease) 04/14/2020   a.) RHC 04/14/2020: mod CAD with sev obstructive Dz in large dom RCA; staged PCI planned. b.) PCI 04/28/2020: DES (unknown type) placed to dRCA.   Depression    Diastolic dysfunction 62/10/5595   a.) TTE 01/06/2019: EF >55%; triv panvalvular regurgitation; G1DD. b.) TTE 01/26/2020: EF > 55%; mod LVH; triv MR/TR/PR; G1DD.   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 b.) TTE 01/26/2020: EF >55%; sev AS (MPG 47.1 mmHg). c.) RHC 04/14/2020: norm CI, PVR, RAP; PCWP 10. d.) s/p TAVR 05/11/2020. e.) TTE 06/01/2020: EF >55%; mild LVH; MPG 9 mmHg. f.) TTE 06/16/2021: EF >55%; mild LVH; GLS -11.6%; MPG 24  mmHg.   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   a.) 23 mm Edwards Sapien S3 bioprosthetic valve   T2DM (type 2 diabetes mellitus) ([place])    Tubular adenoma of colon    Varicose veins of both legs with edema    Past Surgical History:  Procedure Laterality Date   bladder stimulater  2014   CARDIAC VALVE REPLACEMENT     CATARACT EXTRACTION W/ INTRAOCULAR LENS IMPLANT Bilateral 2014   one eye done then the other eye done a month later   [place] N/A 06/26/2018   Procedure: COLONOSCOPY WITH PROPOFOL;  Surgeon: Toledo, Benay Pike, MD;  Location: [facility] END[facility];  Service: Gastroenterology;  Laterality: N/A;   EYE SURGERY Right 2018   macular hole   JOINT REPLACEMENT Right 10/27/2016   JOINT REPLACEMENT Left 2014   KNEE CLOSED REDUCTION Right 11/29/2016   Procedure: CLOSED MANIPULATION KNEE;  Surgeon: Leanor Kail, MD;  Location: [facility] ORS;  Service: Orthopedics;  Laterality: Right;   LUMBAR LAMINECTOMY/DECOMPRESSION MICRODISCECTOMY N/A 02/06/2022   Procedure: L3-5 DECOMPRESSION;  Surgeon: Meade Maw, MD;  Location: [facility] ORS;  Service: Neurosurgery;  Laterality: N/A;   Patient Active Problem List  Diagnosis Date Noted   Lumbar stenosis 02/06/2022   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   CAD (coronary artery disease) 04/28/2020   S/P angioplasty with stent 04/28/2020   Nonrheumatic aortic valve stenosis 03/12/2020   Essential hypertension 02/06/2019   Hyperlipidemia 02/06/2019   Spinal stenosis of lumbar region with radiculopathy 09/27/2018   BMI 38.0-38.9,adult 09/26/2018   Back pain 09/05/2018   Diet-controlled type 2 diabetes mellitus ([place]) 03/27/2018   Postmenopausal 03/27/2018   DJD (degenerative joint disease) 02/04/2018   History of angioedema 09/17/2017   Hyperglycemia  09/17/2017   Lymphedema 02/04/2017   Chronic venous insufficiency 02/04/2017   Swelling of limb 02/04/2017   Pure hypercholesterolemia 10/02/2016   History of total knee arthroplasty 09/20/2016   Varicose veins of both legs with edema 03/25/2016   Abnormal mammogram 06/01/2015   Urinary incontinence in female 06/01/2015   Prediabetes 06/16/2014   Anxiety and depression 01/28/2014   Psoriasis 01/28/2014    REFERRING DIAG: spinal stenosis of lumbar region with neurogenic claudication, imbalance  THERAPY DIAG:  Other low back pain  Difficulty in walking, not elsewhere classified  Unsteadiness on feet  Muscle weakness (generalized)  Unspecified disturbances of skin sensation  Stiffness of right hip, not elsewhere classified  Stiffness of left hip, not elsewhere classified  Abnormal posture  Rationale for Evaluation and Treatment: Rehabilitation  PERTINENT HISTORY: Patient is a 71 y.o. female who presents to outpatient physical therapy with a referral for medical diagnosis spinal stenosis of lumbar region with neurogenic claudication, imbalance. This patient's chief complaints consist of numbness and weakness in her B lower legs following L3-5 lumbar decompression including central laminectomy and bilateral medial facetectomies including foraminotomies on  02/06/22 and difficulty with mobility and balance leading to the following functional deficits: difficulty with anything that requires walking, standing, and/or upright balance including shopping, carrying, grocery shopping, navigating stairs, social outings, housework, yardwork, bathing and dressing. Relevant past medical history and comorbidities include anxiety, arthritis, CAD with stents placed (monitored 1x a year by GP), depression, diastolic dysfunction, diverticulosis, hereditary lymphedema of B legs, history of angioedema, hyperlipidemia, HTN, lumbar spinal stenosis, s/P TAVR (05/11/2020), T2DM, Tubular adenoma of colon,  varicose veins of both legs with edema, bladder stimulator (after bladder damaged from attempts to break up kidney stones per pt), bilateral cataract surgery, B knee replacements (R 2018 with manipulation, L 2014), lumbar laminectomy/decompression/microdiscectomy at L3-5 (02/06/2022), urinary incontinence, former smoker.  Patient denies hx of cancer, stroke, seizures, lung problems, unexplained weight loss, and osteoporosis. She endorses worsening urinary incontinence since back surgery; has a history of bladder damage that made it hard for her to get to control her urine once she felt urgency; now, she does not feel urgency since having her back surgery. She has a bladder stimulator that is not working since right before [employer].   PRECAUTIONS: do not lift anything over 25#, fall.   SUBJECTIVE: Patient arrives with [facility]. She states she can walk in a straight line now. She takes her cane with her but only uses it if she needs it. She states her HEP is going okay. She does them about every other day. She states getting up and down out of a chair is getting pretty easy. She reports she still having trouble with balance when turning. She rates her improvement at 50%. She does not feel like the numbness in her feet is getting any better but she does not feel like she is falling all the time. She states she feels  like she is going to fall when she turns her body fast.   PAIN:  Are you having pain? Numbness in bilateral lower legs.   OBJECTIVE  SELF-REPORTED FUNCTION FOTO score: 54/100 (balance questionnaire)  FUNCTIONAL/BALANCE TESTS 6 Minute Walk Test: 912 feet while carrying SPC (not using it). Mildly stooped posture and flexed knees. Limited by shortness of breath.   Static balance: -Tandem stance, eyes open: R = 11 seconds, L = 4 seconds (increased sway and shakiness in B LE bilaterally).   TODAY'S TREATMENT  Therapeutic exercise: to centralize symptoms and improve ROM, strength, muscular  endurance, and activity tolerance required for successful completion of functional activities.  - ambulation around clinic for distance in 6 minute to assess progress (see 6MWT above). Supervision to SBA.   Neuromuscular Re-education: to improve, balance, postural strength, muscle activation patterns, and stabilization strength required for functional activities: - tandem stance for max time of 3 trials each side. SBA.  - standing alternating 360 degree turns touching TM bar with B UE and naming position of second hand on wall clock across room, 1x6, 2x5 each direction with SBA. Stopped due to feeling of each leg giving out first set, then dizziness.  - quarter turns with ball bounce off wall, 1x10 each direction SBA. Small pink theraball.  - half turns with ball bounce off wall in hallway, 1x6 each side with SBA. Small pink theraball.  - sit <> stand with ball drop/retreive from 18 inch chair. 1x3 with 1kg med ball, 1x10 with 3kg med ball. SBA. Patient unsteady when bending forwards go get ball.   Pt required multimodal cuing for proper technique and to facilitate improved neuromuscular control, strength, range of motion, and functional ability resulting in improved performance and form.     PATIENT EDUCATION:  Education details: Exercise purpose/form. Self management techniques. Education on HEP including handout  Person educated: Patient Education method: Explanation, demonstration, verbal cuing Education comprehension: verbalized and demonstrated understanding and needs further education     HOME EXERCISE PROGRAM: Access Code: [email or web address] URL: [email or web address] Date: 04/13/2022 Prepared by: Rosita Kea  Exercises - Tandem Stance  - 1 x daily - 2 sets - 1 minute hold - Corner Balance Feet Together With Eyes Closed  - 1 x daily - 2 sets - 1 min hold - Corner Balance Feet Together: Eyes Open With Head Turns  - 1 x daily - 2 sets - 20 reps - Sit to Stand Without Arm  Support  - 1 x daily - 3 sets - 10 reps   ASSESSMENT:   CLINICAL IMPRESSION: Patient has attended 10 physical therapy sessions since starting current episode of care on 04/11/2022. She demonstrates improvements in FOTO score, 6 minute walk test, and subjective report. She continues to be quite unsteady on her feet, especially with turning, and has limited functional activity tolerance. Patient continues to have numbness in her feet. She has not yet reached her maximum potential improvements and would benefit from continued PT. Plan to continue at a rate of 2x a week for 12 more weeks. Patient would benefit from continued management of limiting condition by skilled physical therapist to address remaining impairments and functional limitations to work towards stated goals and return to PLOF or maximal functional independence.    From PT eval 04/11/2022:  Patient is a 71 y.o. female referred to outpatient physical therapy with a medical diagnosis of spinal stenosis of lumbar region with neurogenic claudication and imbalance who presents with signs and symptoms consistent with unsteadiness  on feet, loss of sensation, and difficulty with strength and motor control of B lower legs as well as hip and back stiffness with weakness in right dorsiflexion and great toe extension. She also reported increased urinary incontinence since surgery where she leaks without urge, so she has been wearing diapers. Lower leg symptoms appear similar to that expected from peripheral neuropathy. R ankle/foot weakness suggests possible former or current compromise of L4 or L5 nerve root, but could also be related to neuropathy symptoms. Patient denies weakness or numbness in B lower legs prior to recent back surgery and and chart review lacks prior record of involvement of patient's lower legs, ankles, or feet.  Patient's symptoms were unable to be worsened or relieved with exam today and PT was unable to establish a direct link between  lumbar spine and lower leg numbness/weakness. Patient is also suffering from significant stooped posture with lack of hip and back extension ROM contributing. This is likely related to having difficulty with maintaining standing postures for a prolonged period related to spinal stenosis and back pain in standing postures. Patient presents with significant pain, ROM, sensation, balance, motor control, posture, joint stiffness, gait, muscle tension, muscle performance (strength/power/endurance) and activity tolerance impairments that are limiting ability to complete her usual activities including anything that requires walking, standing, and/or upright balance including shopping, carrying, grocery shopping, navigating stairs, social outings, housework, yardwork, bathing and dressing without difficulty. Patient will benefit from skilled physical therapy intervention to address current body structure impairments and activity limitations to improve function and work towards goals set in current POC in order to return to prior level of function or maximal functional improvement.      OBJECTIVE IMPAIRMENTS Abnormal gait, decreased activity tolerance, decreased balance, decreased coordination, decreased endurance, decreased knowledge of condition, decreased knowledge of use of DME, decreased mobility, difficulty walking, decreased ROM, decreased strength, dizziness, hypomobility, increased edema, impaired perceived functional ability, impaired flexibility, impaired sensation, improper body mechanics, postural dysfunction, obesity, and pain.    ACTIVITY LIMITATIONS carrying, lifting, standing, squatting, stairs, transfers, bed mobility, continence, bathing, dressing, reach over head, locomotion level, and caring for others   PARTICIPATION LIMITATIONS: meal prep, cleaning, laundry, interpersonal relationship, shopping, community activity, yard work, and   anything that requires walking, standing, and/or upright balance  including shopping, carrying, grocery shopping, navigating stairs, social outings, housework, yardwork, bathing and dressing, gardening, going to the movies.    PERSONAL FACTORS Age, Fitness, Time since onset of injury/illness/exacerbation, and 3+ comorbidities:   anxiety, arthritis, CAD with stents placed (monitored 1x a year by GP), depression, diastolic dysfunction, diverticulosis, hereditary lymphedema of B legs, history of angioedema, hyperlipidemia, HTN, lumbar spinal stenosis, s/P TAVR (05/11/2020), T2DM, Tubular adenoma of colon, varicose veins of both legs with edema, bladder stimulator (after bladder damaged from attempts to break up kidney stones per pt), bilateral cataract surgery, B knee replacements (R 2018 with manipulation, L 2014), lumbar laminectomy/decompression/microdiscectomy at L3-5 (02/06/2022), urinary incontinence, former smoker are also affecting patient's functional outcome.    REHAB POTENTIAL: Good   CLINICAL DECISION MAKING: Evolving/moderate complexity   EVALUATION COMPLEXITY: Moderate     GOALS: Goals reviewed with patient? No   SHORT TERM GOALS: Target date: 04/25/2022   Patient will be independent with initial home exercise program for self-management of symptoms. Baseline: Initial HEP to be provided at visit 2 as appropriate (04/11/22); initial HEP provided at visit 2 (04/13/2022);  Goal status: Met     LONG TERM GOALS: Target date: 07/04/2022. Updated to  09/05/2021 on 06/13/2022.    Patient will be independent with a long-term home exercise program for self-management of symptoms.  Baseline: Initial HEP to be provided at visit 2 as appropriate (04/11/22); initial HEP provided at visit 2 (04/13/2022); continuing to participate (06/13/2022);  Goal status: In-progress   2.  Patient will demonstrate improved FOTO by equal or greater than 10 points to demonstrate improvement in overall condition and self-reported functional ability.  Baseline: to be measured visit 2  as appropriate (04/11/22); 40 at visit #2 (04/13/2022); 54 at visit #10 (06/13/2022);  Goal status: In-progress   3.  Patient will ambulate equal or greater than 1000 feet on 6 Minute Walk Test with no AD to demonstrate improved ability to safely complete household and community ambulation without falls.  Baseline: to be tested visit 2 as appropriate (04/11/22); 710 feet with RW and mildly stooped posture, flexed knees. Reports her B LE felt less numb during last part of test. Reported B UE fatigue (04/13/2022);  912 feet while carrying SPC (not using it). Mildly stooped posture and flexed knees. Limited by shortness of breath (06/13/2022);  Goal status: In-progress   4.  Patient will demonstrate the ability to stand in tandem stance for equal or greater than 30 seconds on each side to demonstrate improved static balance for tasks such as cooking and gardening.  Baseline:  R = 16 seconds, L = 5 seconds  (04/11/22);R = 11 seconds, L = 4 seconds (increased sway and shakiness in B LE bilaterally, 06/13/2022);  Goal status: In-progress   5.  Patient will complete community, work and/or recreational activities without limitation due to current condition.  Baseline: difficulty with anything that requires walking, standing, and/or upright balance including shopping, carrying, grocery shopping, navigating stairs, social outings, housework, yardwork, bathing and dressing, walking on uneven ground (04/11/22); rates her improvement 50% and does not feel like she is falling all the time (06/13/2022);  Goal status: In-progress       PLAN: PT FREQUENCY: 1-2x/week   PT DURATION: 12 weeks   PLANNED INTERVENTIONS: Therapeutic exercises, Therapeutic activity, Neuromuscular re-education, Balance training, Gait training, Patient/Family education, Joint mobilization, Stair training, DME instructions, Aquatic Therapy, Dry Needling, Electrical stimulation, Spinal mobilization, Cryotherapy, Moist heat, Manual therapy,  and Re-evaluation.   PLAN FOR NEXT SESSION: update HEP as appropriate, progressive balance and functional/LE strengthening as appropriate.    Everlean Alstrom. Graylon Good, PT, DPT 06/13/22, 6:11 PM  [facility] [address] [place], [place] [id number] P: [phone] I F: [phone]

## 2022-06-15 ENCOUNTER — Encounter: Payer: Self-pay | Admitting: Physical Therapy

## 2022-06-15 ENCOUNTER — Ambulatory Visit: Payer: Medicare Other | Admitting: Physical Therapy

## 2022-06-15 DIAGNOSIS — R293 Abnormal posture: Secondary | ICD-10-CM

## 2022-06-15 DIAGNOSIS — M6281 Muscle weakness (generalized): Secondary | ICD-10-CM

## 2022-06-15 DIAGNOSIS — M5459 Other low back pain: Secondary | ICD-10-CM

## 2022-06-15 DIAGNOSIS — R2681 Unsteadiness on feet: Secondary | ICD-10-CM

## 2022-06-15 DIAGNOSIS — R209 Unspecified disturbances of skin sensation: Secondary | ICD-10-CM

## 2022-06-15 DIAGNOSIS — M25652 Stiffness of left hip, not elsewhere classified: Secondary | ICD-10-CM

## 2022-06-15 DIAGNOSIS — M25651 Stiffness of right hip, not elsewhere classified: Secondary | ICD-10-CM

## 2022-06-15 DIAGNOSIS — R262 Difficulty in walking, not elsewhere classified: Secondary | ICD-10-CM

## 2022-06-15 NOTE — Therapy (Signed)
OUTPATIENT PHYSICAL THERAPY TREATMENT NOTE   Patient Name: Brandy Oneill MRN: [id number] DOB:1951/02/01, 71 y.o., female Today's Date: 06/15/2022  PCP: Tracie Harrier, MD REFERRING PROVIDER: Meade Maw, MD  END OF SESSION:   PT End of Session - 06/15/22 1315     Visit Number 11    Number of Visits 24    Date for PT Re-Evaluation 09/05/22    Authorization Type MEDICARE PART B reporting period from 06/13/2022    Progress Note Due on Visit 10    PT Start Time 1308    PT Stop Time 1346    PT Time Calculation (min) 38 min    Equipment Utilized During Treatment Gait belt    Activity Tolerance Patient tolerated treatment well;Patient limited by fatigue    Behavior During Therapy WFL for tasks assessed/performed             Past Medical History:  Diagnosis Date   Anxiety    Arthritis    CAD (coronary artery disease) 04/14/2020   a.) RHC 04/14/2020: mod CAD with sev obstructive Dz in large dom RCA; staged PCI planned. b.) PCI 04/28/2020: DES (unknown type) placed to dRCA.   Depression    Diastolic dysfunction 73/41/9379   a.) TTE 01/06/2019: EF >55%; triv panvalvular regurgitation; G1DD. b.) TTE 01/26/2020: EF > 55%; mod LVH; triv MR/TR/PR; G1DD.   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 b.) TTE 01/26/2020: EF >55%; sev AS (MPG 47.1 mmHg). c.) RHC 04/14/2020: norm CI, PVR, RAP; PCWP 10. d.) s/p TAVR 05/11/2020. e.) TTE 06/01/2020: EF >55%; mild LVH; MPG 9 mmHg. f.) TTE 06/16/2021: EF >55%; mild LVH; GLS -11.6%; MPG 24 mmHg.   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   a.) 23 mm Edwards Sapien S3 bioprosthetic valve   T2DM (type 2 diabetes mellitus) ([place])    Tubular adenoma of colon    Varicose veins of both legs with edema    Past Surgical History:  Procedure Laterality Date   bladder stimulater  2014   CARDIAC VALVE REPLACEMENT     CATARACT EXTRACTION W/ INTRAOCULAR LENS IMPLANT Bilateral 2014   one eye done then the other eye done a month later   [place] N/A 06/26/2018   Procedure: COLONOSCOPY WITH PROPOFOL;  Surgeon: Toledo, Benay Pike, MD;  Location: [facility];  Service: Gastroenterology;  Laterality: N/A;   EYE SURGERY Right 2018   macular hole   JOINT REPLACEMENT Right 10/27/2016   JOINT REPLACEMENT Left 2014   KNEE CLOSED REDUCTION Right 11/29/2016   Procedure: CLOSED MANIPULATION KNEE;  Surgeon: Leanor Kail, MD;  Location: [facility] ORS;  Service: Orthopedics;  Laterality: Right;   LUMBAR LAMINECTOMY/DECOMPRESSION MICRODISCECTOMY N/A 02/06/2022   Procedure: L3-5 DECOMPRESSION;  Surgeon: Meade Maw, MD;  Location: [facility] ORS;  Service: Neurosurgery;  Laterality: N/A;   Patient Active Problem List   Diagnosis Date Noted   Lumbar stenosis 02/06/2022   S/P TAVR (  transcatheter aortic valve replacement) 05/10/2020   CAD (coronary artery disease) 04/28/2020   S/P angioplasty with stent 04/28/2020   Nonrheumatic aortic valve stenosis 03/12/2020   Essential hypertension 02/06/2019   Hyperlipidemia 02/06/2019   Spinal stenosis of lumbar region with radiculopathy 09/27/2018   BMI 38.0-38.9,adult 09/26/2018   Back pain 09/05/2018   Diet-controlled type 2 diabetes mellitus ([place]) 03/27/2018   Postmenopausal 03/27/2018   DJD (degenerative joint disease) 02/04/2018   History of angioedema 09/17/2017   Hyperglycemia 09/17/2017   Lymphedema 02/04/2017   Chronic venous insufficiency 02/04/2017    Swelling of limb 02/04/2017   Pure hypercholesterolemia 10/02/2016   History of total knee arthroplasty 09/20/2016   Varicose veins of both legs with edema 03/25/2016   Abnormal mammogram 06/01/2015   Urinary incontinence in female 06/01/2015   Prediabetes 06/16/2014   Anxiety and depression 01/28/2014   Psoriasis 01/28/2014    REFERRING DIAG: spinal stenosis of lumbar region with neurogenic claudication, imbalance  THERAPY DIAG:  Other low back pain  Difficulty in walking, not elsewhere classified  Unsteadiness on feet  Muscle weakness (generalized)  Unspecified disturbances of skin sensation  Stiffness of right hip, not elsewhere classified  Stiffness of left hip, not elsewhere classified  Abnormal posture  Rationale for Evaluation and Treatment: Rehabilitation  PERTINENT HISTORY: Patient is a 71 y.o. female who presents to outpatient physical therapy with a referral for medical diagnosis spinal stenosis of lumbar region with neurogenic claudication, imbalance. This patient's chief complaints consist of numbness and weakness in her B lower legs following L3-5 lumbar decompression including central laminectomy and bilateral medial facetectomies including foraminotomies on  02/06/22 and difficulty with mobility and balance leading to the following functional deficits: difficulty with anything that requires walking, standing, and/or upright balance including shopping, carrying, grocery shopping, navigating stairs, social outings, housework, yardwork, bathing and dressing. Relevant past medical history and comorbidities include anxiety, arthritis, CAD with stents placed (monitored 1x a year by GP), depression, diastolic dysfunction, diverticulosis, hereditary lymphedema of B legs, history of angioedema, hyperlipidemia, HTN, lumbar spinal stenosis, s/P TAVR (05/11/2020), T2DM, Tubular adenoma of colon, varicose veins of both legs with edema, bladder stimulator (after bladder damaged from  attempts to break up kidney stones per pt), bilateral cataract surgery, B knee replacements (R 2018 with manipulation, L 2014), lumbar laminectomy/decompression/microdiscectomy at L3-5 (02/06/2022), urinary incontinence, former smoker.  Patient denies hx of cancer, stroke, seizures, lung problems, unexplained weight loss, and osteoporosis. She endorses worsening urinary incontinence since back surgery; has a history of bladder damage that made it hard for her to get to control her urine once she felt urgency; now, she does not feel urgency since having her back surgery. She has a bladder stimulator that is not working since right before [employer].   PRECAUTIONS: do not lift anything over 25#, fall.   SUBJECTIVE: Patient arrives with [facility]. She states she is tired since she got up so early today. She states she was tired after last PT session. She reports no falls since last PT session. She requests to use the Nustep today as part of her therapy today because it helps loosen her up.   PAIN:  Are you having pain? Numbness in bilateral lower legs.   OBJECTIVE   TODAY'S TREATMENT  Therapeutic exercise: to centralize symptoms and improve ROM, strength, muscular endurance, and activity tolerance required for successful completion of functional activities.  - NuStep level 5 using bilateral upper and lower extremities. Seat/handle setting 7/9. For improved extremity mobility, muscular endurance,  and activity tolerance; and to induce the analgesic effect of aerobic exercise, stimulate improved joint nutrition, and prepare body structures and systems for following interventions. x 6 minutes. Average SPM = 60.  Circuit:  - standing B heel raise with balls of feet on 2x4 board, B UE support, 3x12.  - sit <> stand with ball drop/retreive from 18 inch chair. 2x10 with 3kg med ball. SBA. Patient unsteady when bending forwards go get ball.   - attempted reverse and forwards deadmill exercise but unable to  coordinate feet and also appeared to lack strength.   - step up to 6 inch step, 2x10 with each foot leading and contralateral UE support. Attempted with 8 inch step but too difficult. Needed cuing for sequencing and CGA for safety. 5# AW on each foot.   Neuromuscular Re-education: to improve, balance, postural strength, muscle activation patterns, and stabilization strength required for functional activities: - standing alternating 360 degree turns touching TM bar with B UE and naming position of second hand on wall clock across room, each direction with SBA. Notes feeling of dizziness.   Pt required multimodal cuing for proper technique and to facilitate improved neuromuscular control, strength, range of motion, and functional ability resulting in improved performance and form.     PATIENT EDUCATION:  Education details: Exercise purpose/form. Self management techniques. Education on HEP including handout  Person educated: Patient Education method: Explanation, demonstration, verbal cuing Education comprehension: verbalized and demonstrated understanding and needs further education     HOME EXERCISE PROGRAM: Access Code: [email or web address] URL: [email or web address] Date: 04/13/2022 Prepared by: Rosita Kea  Exercises - Tandem Stance  - 1 x daily - 2 sets - 1 minute hold - Corner Balance Feet Together With Eyes Closed  - 1 x daily - 2 sets - 1 min hold - Corner Balance Feet Together: Eyes Open With Head Turns  - 1 x daily - 2 sets - 20 reps - Sit to Stand Without Arm Support  - 1 x daily - 3 sets - 10 reps   ASSESSMENT:   CLINICAL IMPRESSION: Patient tolerated treatment well overall with no increase in pain by end of session. Patient educated on possible DOMS and appropriate response. Session focused on LE strengthening and balance to improve functional mobility and decrease fall risk. Patient continues to require at least moderate cuing to complete exercises safely and frequently  forgot which foot to use on step up exercise. She also continues to report dizziness with turning exercises. Patient would benefit from continued management of limiting condition by skilled physical therapist to address remaining impairments and functional limitations to work towards stated goals and return to PLOF or maximal functional independence.   From PT eval 04/11/2022:  Patient is a 71 y.o. female referred to outpatient physical therapy with a medical diagnosis of spinal stenosis of lumbar region with neurogenic claudication and imbalance who presents with signs and symptoms consistent with unsteadiness on feet, loss of sensation, and difficulty with strength and motor control of B lower legs as well as hip and back stiffness with weakness in right dorsiflexion and great toe extension. She also reported increased urinary incontinence since surgery where she leaks without urge, so she has been wearing diapers. Lower leg symptoms appear similar to that expected from peripheral neuropathy. R ankle/foot weakness suggests possible former or current compromise of L4 or L5 nerve root, but could also be related to neuropathy symptoms. Patient denies weakness or numbness in B lower legs prior to recent back surgery and  and chart review lacks prior record of involvement of patient's lower legs, ankles, or feet.  Patient's symptoms were unable to be worsened or relieved with exam today and PT was unable to establish a direct link between lumbar spine and lower leg numbness/weakness. Patient is also suffering from significant stooped posture with lack of hip and back extension ROM contributing. This is likely related to having difficulty with maintaining standing postures for a prolonged period related to spinal stenosis and back pain in standing postures. Patient presents with significant pain, ROM, sensation, balance, motor control, posture, joint stiffness, gait, muscle tension, muscle performance  (strength/power/endurance) and activity tolerance impairments that are limiting ability to complete her usual activities including anything that requires walking, standing, and/or upright balance including shopping, carrying, grocery shopping, navigating stairs, social outings, housework, yardwork, bathing and dressing without difficulty. Patient will benefit from skilled physical therapy intervention to address current body structure impairments and activity limitations to improve function and work towards goals set in current POC in order to return to prior level of function or maximal functional improvement.      OBJECTIVE IMPAIRMENTS Abnormal gait, decreased activity tolerance, decreased balance, decreased coordination, decreased endurance, decreased knowledge of condition, decreased knowledge of use of DME, decreased mobility, difficulty walking, decreased ROM, decreased strength, dizziness, hypomobility, increased edema, impaired perceived functional ability, impaired flexibility, impaired sensation, improper body mechanics, postural dysfunction, obesity, and pain.    ACTIVITY LIMITATIONS carrying, lifting, standing, squatting, stairs, transfers, bed mobility, continence, bathing, dressing, reach over head, locomotion level, and caring for others   PARTICIPATION LIMITATIONS: meal prep, cleaning, laundry, interpersonal relationship, shopping, community activity, yard work, and   anything that requires walking, standing, and/or upright balance including shopping, carrying, grocery shopping, navigating stairs, social outings, housework, yardwork, bathing and dressing, gardening, going to the movies.    PERSONAL FACTORS Age, Fitness, Time since onset of injury/illness/exacerbation, and 3+ comorbidities:   anxiety, arthritis, CAD with stents placed (monitored 1x a year by GP), depression, diastolic dysfunction, diverticulosis, hereditary lymphedema of B legs, history of angioedema, hyperlipidemia, HTN,  lumbar spinal stenosis, s/P TAVR (05/11/2020), T2DM, Tubular adenoma of colon, varicose veins of both legs with edema, bladder stimulator (after bladder damaged from attempts to break up kidney stones per pt), bilateral cataract surgery, B knee replacements (R 2018 with manipulation, L 2014), lumbar laminectomy/decompression/microdiscectomy at L3-5 (02/06/2022), urinary incontinence, former smoker are also affecting patient's functional outcome.    REHAB POTENTIAL: Good   CLINICAL DECISION MAKING: Evolving/moderate complexity   EVALUATION COMPLEXITY: Moderate     GOALS: Goals reviewed with patient? No   SHORT TERM GOALS: Target date: 04/25/2022   Patient will be independent with initial home exercise program for self-management of symptoms. Baseline: Initial HEP to be provided at visit 2 as appropriate (04/11/22); initial HEP provided at visit 2 (04/13/2022);  Goal status: Met     LONG TERM GOALS: Target date: 07/04/2022. Updated to 09/05/2021 on 06/13/2022.    Patient will be independent with a long-term home exercise program for self-management of symptoms.  Baseline: Initial HEP to be provided at visit 2 as appropriate (04/11/22); initial HEP provided at visit 2 (04/13/2022); continuing to participate (06/13/2022);  Goal status: In-progress   2.  Patient will demonstrate improved FOTO by equal or greater than 10 points to demonstrate improvement in overall condition and self-reported functional ability.  Baseline: to be measured visit 2 as appropriate (04/11/22); 40 at visit #2 (04/13/2022); 54 at visit #10 (06/13/2022);  Goal status: In-progress  3.  Patient will ambulate equal or greater than 1000 feet on 6 Minute Walk Test with no AD to demonstrate improved ability to safely complete household and community ambulation without falls.  Baseline: to be tested visit 2 as appropriate (04/11/22); 710 feet with RW and mildly stooped posture, flexed knees. Reports her B LE felt less numb during  last part of test. Reported B UE fatigue (04/13/2022);  912 feet while carrying SPC (not using it). Mildly stooped posture and flexed knees. Limited by shortness of breath (06/13/2022);  Goal status: In-progress   4.  Patient will demonstrate the ability to stand in tandem stance for equal or greater than 30 seconds on each side to demonstrate improved static balance for tasks such as cooking and gardening.  Baseline:  R = 16 seconds, L = 5 seconds  (04/11/22);R = 11 seconds, L = 4 seconds (increased sway and shakiness in B LE bilaterally, 06/13/2022);  Goal status: In-progress   5.  Patient will complete community, work and/or recreational activities without limitation due to current condition.  Baseline: difficulty with anything that requires walking, standing, and/or upright balance including shopping, carrying, grocery shopping, navigating stairs, social outings, housework, yardwork, bathing and dressing, walking on uneven ground (04/11/22); rates her improvement 50% and does not feel like she is falling all the time (06/13/2022);  Goal status: In-progress       PLAN: PT FREQUENCY: 1-2x/week   PT DURATION: 12 weeks   PLANNED INTERVENTIONS: Therapeutic exercises, Therapeutic activity, Neuromuscular re-education, Balance training, Gait training, Patient/Family education, Joint mobilization, Stair training, DME instructions, Aquatic Therapy, Dry Needling, Electrical stimulation, Spinal mobilization, Cryotherapy, Moist heat, Manual therapy, and Re-evaluation.   PLAN FOR NEXT SESSION: update HEP as appropriate, progressive balance and functional/LE strengthening as appropriate.    Everlean Alstrom. Graylon Good, PT, DPT 06/15/22, 2:09 PM  [facility] [address] [place], [place] [address] P: [phone] I F: [phone]

## 2022-06-19 ENCOUNTER — Encounter (INDEPENDENT_AMBULATORY_CARE_PROVIDER_SITE_OTHER): Payer: Self-pay

## 2022-06-21 ENCOUNTER — Ambulatory Visit: Payer: Medicare Other | Attending: Neurosurgery | Admitting: Physical Therapy

## 2022-06-21 ENCOUNTER — Encounter: Payer: Self-pay | Admitting: Physical Therapy

## 2022-06-21 DIAGNOSIS — R209 Unspecified disturbances of skin sensation: Secondary | ICD-10-CM | POA: Diagnosis present

## 2022-06-21 DIAGNOSIS — M25652 Stiffness of left hip, not elsewhere classified: Secondary | ICD-10-CM

## 2022-06-21 DIAGNOSIS — M5459 Other low back pain: Secondary | ICD-10-CM

## 2022-06-21 DIAGNOSIS — R293 Abnormal posture: Secondary | ICD-10-CM

## 2022-06-21 DIAGNOSIS — R262 Difficulty in walking, not elsewhere classified: Secondary | ICD-10-CM

## 2022-06-21 DIAGNOSIS — R2681 Unsteadiness on feet: Secondary | ICD-10-CM

## 2022-06-21 DIAGNOSIS — M25651 Stiffness of right hip, not elsewhere classified: Secondary | ICD-10-CM | POA: Diagnosis present

## 2022-06-21 DIAGNOSIS — M6281 Muscle weakness (generalized): Secondary | ICD-10-CM

## 2022-06-21 NOTE — Therapy (Signed)
OUTPATIENT PHYSICAL THERAPY TREATMENT NOTE   Patient Name: Brandy Oneill MRN: [id number] DOB:1951/03/01, 71 y.o., female Today's Date: 06/21/2022  PCP: Tracie Harrier, MD REFERRING PROVIDER: Meade Maw, MD  END OF SESSION:   PT End of Session - 06/21/22 1415     Visit Number 12    Number of Visits 24    Date for PT Re-Evaluation 09/05/22    Authorization Type MEDICARE PART B reporting period from 06/13/2022    Progress Note Due on Visit 10    PT Start Time 1355    PT Stop Time 1435    PT Time Calculation (min) 40 min    Equipment Utilized During Treatment Gait belt    Activity Tolerance Patient tolerated treatment well;Patient limited by fatigue    Behavior During Therapy WFL for tasks assessed/performed              Past Medical History:  Diagnosis Date   Anxiety    Arthritis    CAD (coronary artery disease) 04/14/2020   a.) RHC 04/14/2020: mod CAD with sev obstructive Dz in large dom RCA; staged PCI planned. b.) PCI 04/28/2020: DES (unknown type) placed to dRCA.   Depression    Diastolic dysfunction 80/99/8338   a.) TTE 01/06/2019: EF >55%; triv panvalvular regurgitation; G1DD. b.) TTE 01/26/2020: EF > 55%; mod LVH; triv MR/TR/PR; G1DD.   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 b.) TTE 01/26/2020: EF >55%; sev AS (MPG 47.1 mmHg). c.) RHC 04/14/2020: norm CI, PVR, RAP; PCWP 10. d.) s/p TAVR 05/11/2020. e.) TTE 06/01/2020: EF >55%; mild LVH; MPG 9 mmHg. f.) TTE 06/16/2021: EF >55%; mild LVH; GLS -11.6%; MPG 24 mmHg.   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   a.) 23 mm Edwards Sapien S3 bioprosthetic valve   T2DM (type 2 diabetes mellitus) ([place])    Tubular adenoma of colon    Varicose veins of both legs with edema    Past Surgical History:  Procedure Laterality Date   bladder stimulater  2014   CARDIAC VALVE REPLACEMENT     CATARACT EXTRACTION W/ INTRAOCULAR LENS IMPLANT Bilateral 2014   one eye done then the other eye done a month later   [place] N/A 06/26/2018   Procedure: COLONOSCOPY WITH PROPOFOL;  Surgeon: Toledo, Benay Pike, MD;  Location: [facility];  Service: Gastroenterology;  Laterality: N/A;   EYE SURGERY Right 2018   macular hole   JOINT REPLACEMENT Right 10/27/2016   JOINT REPLACEMENT Left 2014   KNEE CLOSED REDUCTION Right 11/29/2016   Procedure: CLOSED MANIPULATION KNEE;  Surgeon: Leanor Kail, MD;  Location: [facility] ORS;  Service: Orthopedics;  Laterality: Right;   LUMBAR LAMINECTOMY/DECOMPRESSION MICRODISCECTOMY N/A 02/06/2022   Procedure: L3-5 DECOMPRESSION;  Surgeon: Meade Maw, MD;  Location: [facility] ORS;  Service: Neurosurgery;  Laterality: N/A;   Patient Active Problem List   Diagnosis Date Noted   Lumbar stenosis 02/06/2022   S/P  TAVR (transcatheter aortic valve replacement) 05/10/2020   CAD (coronary artery disease) 04/28/2020   S/P angioplasty with stent 04/28/2020   Nonrheumatic aortic valve stenosis 03/12/2020   Essential hypertension 02/06/2019   Hyperlipidemia 02/06/2019   Spinal stenosis of lumbar region with radiculopathy 09/27/2018   BMI 38.0-38.9,adult 09/26/2018   Back pain 09/05/2018   Diet-controlled type 2 diabetes mellitus ([place]) 03/27/2018   Postmenopausal 03/27/2018   DJD (degenerative joint disease) 02/04/2018   History of angioedema 09/17/2017   Hyperglycemia 09/17/2017   Lymphedema 02/04/2017   Chronic venous insufficiency 02/04/2017    Swelling of limb 02/04/2017   Pure hypercholesterolemia 10/02/2016   History of total knee arthroplasty 09/20/2016   Varicose veins of both legs with edema 03/25/2016   Abnormal mammogram 06/01/2015   Urinary incontinence in female 06/01/2015   Prediabetes 06/16/2014   Anxiety and depression 01/28/2014   Psoriasis 01/28/2014    REFERRING DIAG: spinal stenosis of lumbar region with neurogenic claudication, imbalance  THERAPY DIAG:  Other low back pain  Difficulty in walking, not elsewhere classified  Unsteadiness on feet  Muscle weakness (generalized)  Unspecified disturbances of skin sensation  Stiffness of right hip, not elsewhere classified  Stiffness of left hip, not elsewhere classified  Abnormal posture  Rationale for Evaluation and Treatment: Rehabilitation  PERTINENT HISTORY: Patient is a 71 y.o. female who presents to outpatient physical therapy with a referral for medical diagnosis spinal stenosis of lumbar region with neurogenic claudication, imbalance. This patient's chief complaints consist of numbness and weakness in her B lower legs following L3-5 lumbar decompression including central laminectomy and bilateral medial facetectomies including foraminotomies on  02/06/22 and difficulty with mobility and balance leading to the following functional deficits: difficulty with anything that requires walking, standing, and/or upright balance including shopping, carrying, grocery shopping, navigating stairs, social outings, housework, yardwork, bathing and dressing. Relevant past medical history and comorbidities include anxiety, arthritis, CAD with stents placed (monitored 1x a year by GP), depression, diastolic dysfunction, diverticulosis, hereditary lymphedema of B legs, history of angioedema, hyperlipidemia, HTN, lumbar spinal stenosis, s/P TAVR (05/11/2020), T2DM, Tubular adenoma of colon, varicose veins of both legs with edema, bladder stimulator (after bladder damaged from  attempts to break up kidney stones per pt), bilateral cataract surgery, B knee replacements (R 2018 with manipulation, L 2014), lumbar laminectomy/decompression/microdiscectomy at L3-5 (02/06/2022), urinary incontinence, former smoker.  Patient denies hx of cancer, stroke, seizures, lung problems, unexplained weight loss, and osteoporosis. She endorses worsening urinary incontinence since back surgery; has a history of bladder damage that made it hard for her to get to control her urine once she felt urgency; now, she does not feel urgency since having her back surgery. She has a bladder stimulator that is not working since right before [employer].   PRECAUTIONS: do not lift anything over 25#, fall.   SUBJECTIVE: Patient arrives with [facility]. She states she is starting to get the feeling back in her lower legs to about half way distal from the knee. She states her ankles and calves were sore after last PT session. She is going to the cardiologist to get a yearly echocardiogram.   PAIN:  Are you having pain? Numbness in bilateral lower legs distal to mid tibia.   OBJECTIVE   TODAY'S TREATMENT  Therapeutic exercise: to centralize symptoms and improve ROM, strength, muscular endurance, and activity tolerance required for successful completion of functional activities.  - NuStep level 6 using bilateral upper and lower extremities. Seat/handle setting 7/9. For improved extremity mobility,  muscular endurance, and activity tolerance; and to induce the analgesic effect of aerobic exercise, stimulate improved joint nutrition, and prepare body structures and systems for following interventions. x 6 minutes. Average SPM = 60.  Circuit:  - sit <> stand with ball drop/retreive from 18 inch chair. 3x10 with 3kg med ball. SBA. Patient unsteady when bending forwards go get ball.  - standing B heel raise with balls of feet on 2x4 board, B UE support, 3x15.   - forwards step up to 6 inch step, 2x10 with each foot  leading and contralateral UE support. Needed cuing for sequencing and CGA for safety. 5# AW on each foot.    Neuromuscular Re-education: to improve, balance, postural strength, muscle activation patterns, and stabilization strength required for functional activities: - ambulation over ground for speed  with SPC with 5# AW on each foot to improve gait pattern and balance. 2x200 feet with 1 min rest. CGA. Cuing for speed and taking larger, higher steps.  - standing alternating 360 degree turns touching TM bar with B UE and naming position of second hand on wall clock across room, each direction with SBA. 1x10 each direction with 5#AW on each foot.   Pt required multimodal cuing for proper technique and to facilitate improved neuromuscular control, strength, range of motion, and functional ability resulting in improved performance and form.     PATIENT EDUCATION:  Education details: Exercise purpose/form. Self management techniques. Education on HEP including handout  Person educated: Patient Education method: Explanation, demonstration, verbal cuing Education comprehension: verbalized and demonstrated understanding and needs further education     HOME EXERCISE PROGRAM: Access Code: [email or web address] URL: [email or web address] Date: 04/13/2022 Prepared by: Rosita Kea  Exercises - Tandem Stance  - 1 x daily - 2 sets - 1 minute hold - Corner Balance Feet Together With Eyes Closed  - 1 x daily - 2 sets - 1 min hold - Corner Balance Feet Together: Eyes Open With Head Turns  - 1 x daily - 2 sets - 20 reps - Sit to Stand Without Arm Support  - 1 x daily - 3 sets - 10 reps   ASSESSMENT:   CLINICAL IMPRESSION: Patient continued to tolerate treatment well with no increase in pain by end of session. Continued working on improving LE and functional strength and balance. She continues to be unsteady on her feet when changing directions and walking backwards. She required supervision to [facility] for  safety and cuing for proper performance of exercises. Plan to continue working on improving balance, core/LE/functional strength next session. Patient would benefit from continued management of limiting condition by skilled physical therapist to address remaining impairments and functional limitations to work towards stated goals and return to PLOF or maximal functional independence.   From PT eval 04/11/2022:  Patient is a 71 y.o. female referred to outpatient physical therapy with a medical diagnosis of spinal stenosis of lumbar region with neurogenic claudication and imbalance who presents with signs and symptoms consistent with unsteadiness on feet, loss of sensation, and difficulty with strength and motor control of B lower legs as well as hip and back stiffness with weakness in right dorsiflexion and great toe extension. She also reported increased urinary incontinence since surgery where she leaks without urge, so she has been wearing diapers. Lower leg symptoms appear similar to that expected from peripheral neuropathy. R ankle/foot weakness suggests possible former or current compromise of L4 or L5 nerve root, but could also be related to neuropathy symptoms. Patient denies weakness  or numbness in B lower legs prior to recent back surgery and and chart review lacks prior record of involvement of patient's lower legs, ankles, or feet.  Patient's symptoms were unable to be worsened or relieved with exam today and PT was unable to establish a direct link between lumbar spine and lower leg numbness/weakness. Patient is also suffering from significant stooped posture with lack of hip and back extension ROM contributing. This is likely related to having difficulty with maintaining standing postures for a prolonged period related to spinal stenosis and back pain in standing postures. Patient presents with significant pain, ROM, sensation, balance, motor control, posture, joint stiffness, gait, muscle tension,  muscle performance (strength/power/endurance) and activity tolerance impairments that are limiting ability to complete her usual activities including anything that requires walking, standing, and/or upright balance including shopping, carrying, grocery shopping, navigating stairs, social outings, housework, yardwork, bathing and dressing without difficulty. Patient will benefit from skilled physical therapy intervention to address current body structure impairments and activity limitations to improve function and work towards goals set in current POC in order to return to prior level of function or maximal functional improvement.      OBJECTIVE IMPAIRMENTS Abnormal gait, decreased activity tolerance, decreased balance, decreased coordination, decreased endurance, decreased knowledge of condition, decreased knowledge of use of DME, decreased mobility, difficulty walking, decreased ROM, decreased strength, dizziness, hypomobility, increased edema, impaired perceived functional ability, impaired flexibility, impaired sensation, improper body mechanics, postural dysfunction, obesity, and pain.    ACTIVITY LIMITATIONS carrying, lifting, standing, squatting, stairs, transfers, bed mobility, continence, bathing, dressing, reach over head, locomotion level, and caring for others   PARTICIPATION LIMITATIONS: meal prep, cleaning, laundry, interpersonal relationship, shopping, community activity, yard work, and   anything that requires walking, standing, and/or upright balance including shopping, carrying, grocery shopping, navigating stairs, social outings, housework, yardwork, bathing and dressing, gardening, going to the movies.    PERSONAL FACTORS Age, Fitness, Time since onset of injury/illness/exacerbation, and 3+ comorbidities:   anxiety, arthritis, CAD with stents placed (monitored 1x a year by GP), depression, diastolic dysfunction, diverticulosis, hereditary lymphedema of B legs, history of angioedema,  hyperlipidemia, HTN, lumbar spinal stenosis, s/P TAVR (05/11/2020), T2DM, Tubular adenoma of colon, varicose veins of both legs with edema, bladder stimulator (after bladder damaged from attempts to break up kidney stones per pt), bilateral cataract surgery, B knee replacements (R 2018 with manipulation, L 2014), lumbar laminectomy/decompression/microdiscectomy at L3-5 (02/06/2022), urinary incontinence, former smoker are also affecting patient's functional outcome.    REHAB POTENTIAL: Good   CLINICAL DECISION MAKING: Evolving/moderate complexity   EVALUATION COMPLEXITY: Moderate     GOALS: Goals reviewed with patient? No   SHORT TERM GOALS: Target date: 04/25/2022   Patient will be independent with initial home exercise program for self-management of symptoms. Baseline: Initial HEP to be provided at visit 2 as appropriate (04/11/22); initial HEP provided at visit 2 (04/13/2022);  Goal status: Met     LONG TERM GOALS: Target date: 07/04/2022. Updated to 09/05/2021 on 06/13/2022.    Patient will be independent with a long-term home exercise program for self-management of symptoms.  Baseline: Initial HEP to be provided at visit 2 as appropriate (04/11/22); initial HEP provided at visit 2 (04/13/2022); continuing to participate (06/13/2022);  Goal status: In-progress   2.  Patient will demonstrate improved FOTO by equal or greater than 10 points to demonstrate improvement in overall condition and self-reported functional ability.  Baseline: to be measured visit 2 as appropriate (04/11/22); 40 at visit #2 (  04/13/2022); 54 at visit #10 (06/13/2022);  Goal status: In-progress   3.  Patient will ambulate equal or greater than 1000 feet on 6 Minute Walk Test with no AD to demonstrate improved ability to safely complete household and community ambulation without falls.  Baseline: to be tested visit 2 as appropriate (04/11/22); 710 feet with RW and mildly stooped posture, flexed knees. Reports her B LE  felt less numb during last part of test. Reported B UE fatigue (04/13/2022);  912 feet while carrying SPC (not using it). Mildly stooped posture and flexed knees. Limited by shortness of breath (06/13/2022);  Goal status: In-progress   4.  Patient will demonstrate the ability to stand in tandem stance for equal or greater than 30 seconds on each side to demonstrate improved static balance for tasks such as cooking and gardening.  Baseline:  R = 16 seconds, L = 5 seconds  (04/11/22);R = 11 seconds, L = 4 seconds (increased sway and shakiness in B LE bilaterally, 06/13/2022);  Goal status: In-progress   5.  Patient will complete community, work and/or recreational activities without limitation due to current condition.  Baseline: difficulty with anything that requires walking, standing, and/or upright balance including shopping, carrying, grocery shopping, navigating stairs, social outings, housework, yardwork, bathing and dressing, walking on uneven ground (04/11/22); rates her improvement 50% and does not feel like she is falling all the time (06/13/2022);  Goal status: In-progress       PLAN: PT FREQUENCY: 1-2x/week   PT DURATION: 12 weeks   PLANNED INTERVENTIONS: Therapeutic exercises, Therapeutic activity, Neuromuscular re-education, Balance training, Gait training, Patient/Family education, Joint mobilization, Stair training, DME instructions, Aquatic Therapy, Dry Needling, Electrical stimulation, Spinal mobilization, Cryotherapy, Moist heat, Manual therapy, and Re-evaluation.   PLAN FOR NEXT SESSION: update HEP as appropriate, progressive balance and functional/LE strengthening as appropriate.    Everlean Alstrom. Graylon Good, PT, DPT 06/21/22, 3:36 PM  [facility] [address] [place], [place] [address] P: [phone] I F: [phone]

## 2022-06-27 ENCOUNTER — Encounter: Payer: Self-pay | Admitting: Physical Therapy

## 2022-06-27 ENCOUNTER — Ambulatory Visit: Payer: Medicare Other | Admitting: Physical Therapy

## 2022-06-27 DIAGNOSIS — R209 Unspecified disturbances of skin sensation: Secondary | ICD-10-CM

## 2022-06-27 DIAGNOSIS — R2681 Unsteadiness on feet: Secondary | ICD-10-CM

## 2022-06-27 DIAGNOSIS — M5459 Other low back pain: Secondary | ICD-10-CM | POA: Diagnosis not present

## 2022-06-27 DIAGNOSIS — R293 Abnormal posture: Secondary | ICD-10-CM

## 2022-06-27 DIAGNOSIS — M25652 Stiffness of left hip, not elsewhere classified: Secondary | ICD-10-CM

## 2022-06-27 DIAGNOSIS — M6281 Muscle weakness (generalized): Secondary | ICD-10-CM

## 2022-06-27 DIAGNOSIS — M25651 Stiffness of right hip, not elsewhere classified: Secondary | ICD-10-CM

## 2022-06-27 DIAGNOSIS — R262 Difficulty in walking, not elsewhere classified: Secondary | ICD-10-CM

## 2022-06-27 NOTE — Therapy (Signed)
OUTPATIENT PHYSICAL THERAPY TREATMENT NOTE   Patient Name: Brandy Oneill MRN: [id number] DOB:09/02/1950, 71 y.o., female Today's Date: 06/27/2022  PCP: Tracie Harrier, MD REFERRING PROVIDER: Meade Maw, MD  END OF SESSION:   PT End of Session - 06/27/22 1355     Visit Number 13    Number of Visits 24    Date for PT Re-Evaluation 09/05/22    Authorization Type MEDICARE PART B reporting period from 06/13/2022    Progress Note Due on Visit 10    PT Start Time 1355    PT Stop Time 1433    PT Time Calculation (min) 38 min    Equipment Utilized During Treatment Gait belt    Activity Tolerance Patient tolerated treatment well;Patient limited by fatigue    Behavior During Therapy WFL for tasks assessed/performed               Past Medical History:  Diagnosis Date   Anxiety    Arthritis    CAD (coronary artery disease) 04/14/2020   a.) RHC 04/14/2020: mod CAD with sev obstructive Dz in large dom RCA; staged PCI planned. b.) PCI 04/28/2020: DES (unknown type) placed to dRCA.   Depression    Diastolic dysfunction 74/25/9563   a.) TTE 01/06/2019: EF >55%; triv panvalvular regurgitation; G1DD. b.) TTE 01/26/2020: EF > 55%; mod LVH; triv MR/TR/PR; G1DD.   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 b.) TTE 01/26/2020: EF >55%; sev AS (MPG 47.1 mmHg). c.) RHC 04/14/2020: norm CI, PVR, RAP; PCWP 10. d.) s/p TAVR 05/11/2020. e.) TTE 06/01/2020: EF >55%; mild LVH; MPG 9 mmHg. f.) TTE 06/16/2021: EF >55%; mild LVH; GLS -11.6%; MPG 24 mmHg.   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   a.) 23 mm Edwards Sapien S3 bioprosthetic valve   T2DM (type 2 diabetes mellitus) ([place])    Tubular adenoma of colon    Varicose veins of both legs with edema    Past Surgical History:  Procedure Laterality Date   bladder stimulater  2014   CARDIAC VALVE REPLACEMENT     CATARACT EXTRACTION W/ INTRAOCULAR LENS IMPLANT Bilateral 2014   one eye done then the other eye done a month later   [place] N/A 06/26/2018   Procedure: COLONOSCOPY WITH PROPOFOL;  Surgeon: Toledo, Benay Pike, MD;  Location: [facility];  Service: Gastroenterology;  Laterality: N/A;   EYE SURGERY Right 2018   macular hole   JOINT REPLACEMENT Right 10/27/2016   JOINT REPLACEMENT Left 2014   KNEE CLOSED REDUCTION Right 11/29/2016   Procedure: CLOSED MANIPULATION KNEE;  Surgeon: Leanor Kail, MD;  Location: [facility] ORS;  Service: Orthopedics;  Laterality: Right;   LUMBAR LAMINECTOMY/DECOMPRESSION MICRODISCECTOMY N/A 02/06/2022   Procedure: L3-5 DECOMPRESSION;  Surgeon: Meade Maw, MD;  Location: [facility] ORS;  Service: Neurosurgery;  Laterality: N/A;   Patient Active Problem List   Diagnosis Date Noted   Lumbar stenosis 02/06/2022  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   CAD (coronary artery disease) 04/28/2020   S/P angioplasty with stent 04/28/2020   Nonrheumatic aortic valve stenosis 03/12/2020   Essential hypertension 02/06/2019   Hyperlipidemia 02/06/2019   Spinal stenosis of lumbar region with radiculopathy 09/27/2018   BMI 38.0-38.9,adult 09/26/2018   Back pain 09/05/2018   Diet-controlled type 2 diabetes mellitus ([place]) 03/27/2018   Postmenopausal 03/27/2018   DJD (degenerative joint disease) 02/04/2018   History of angioedema 09/17/2017   Hyperglycemia 09/17/2017   Lymphedema 02/04/2017   Chronic venous insufficiency 02/04/2017    Swelling of limb 02/04/2017   Pure hypercholesterolemia 10/02/2016   History of total knee arthroplasty 09/20/2016   Varicose veins of both legs with edema 03/25/2016   Abnormal mammogram 06/01/2015   Urinary incontinence in female 06/01/2015   Prediabetes 06/16/2014   Anxiety and depression 01/28/2014   Psoriasis 01/28/2014    REFERRING DIAG: spinal stenosis of lumbar region with neurogenic claudication, imbalance  THERAPY DIAG:  Other low back pain  Difficulty in walking, not elsewhere classified  Unsteadiness on feet  Muscle weakness (generalized)  Unspecified disturbances of skin sensation  Stiffness of right hip, not elsewhere classified  Stiffness of left hip, not elsewhere classified  Abnormal posture  Rationale for Evaluation and Treatment: Rehabilitation  PERTINENT HISTORY: Patient is a 71 y.o. female who presents to outpatient physical therapy with a referral for medical diagnosis spinal stenosis of lumbar region with neurogenic claudication, imbalance. This patient's chief complaints consist of numbness and weakness in her B lower legs following L3-5 lumbar decompression including central laminectomy and bilateral medial facetectomies including foraminotomies on  02/06/22 and difficulty with mobility and balance leading to the following functional deficits: difficulty with anything that requires walking, standing, and/or upright balance including shopping, carrying, grocery shopping, navigating stairs, social outings, housework, yardwork, bathing and dressing. Relevant past medical history and comorbidities include anxiety, arthritis, CAD with stents placed (monitored 1x a year by GP), depression, diastolic dysfunction, diverticulosis, hereditary lymphedema of B legs, history of angioedema, hyperlipidemia, HTN, lumbar spinal stenosis, s/P TAVR (05/11/2020), T2DM, Tubular adenoma of colon, varicose veins of both legs with edema, bladder stimulator (after bladder damaged from  attempts to break up kidney stones per pt), bilateral cataract surgery, B knee replacements (R 2018 with manipulation, L 2014), lumbar laminectomy/decompression/microdiscectomy at L3-5 (02/06/2022), urinary incontinence, former smoker.  Patient denies hx of cancer, stroke, seizures, lung problems, unexplained weight loss, and osteoporosis. She endorses worsening urinary incontinence since back surgery; has a history of bladder damage that made it hard for her to get to control her urine once she felt urgency; now, she does not feel urgency since having her back surgery. She has a bladder stimulator that is not working since right before [employer].   PRECAUTIONS: do not lift anything over 25#, fall.   SUBJECTIVE: Patient arrives with [facility]. She states she is late because her dog got out (She got him back). She states the numbness in her legs is getting better but her feet are still numb. She denies falls since last PT session and pain currently . She states she continues to have trouble with turning and stepping up curbs.  She saw the cardiologist since last PT session and he said her heart is strong and healthy.   PAIN:  Are you having pain? Numbness in bilateral feet and ankles.   OBJECTIVE   TODAY'S TREATMENT  Neuromuscular Re-education: to improve, balance, postural strength, muscle activation patterns, and stabilization strength required  for functional activities: - ambulation over ground for speed  with no ADwith 5# AW on each foot and holding 2# DB in each hand with cuing to swing arms to improve gait pattern and balance. 3x200 feet with 1 min rest. CGA. Cuing for speed and taking larger, higher steps, arm swing.  - standing alternating 360 degree turns touching bar with B UE and naming position of second hand on wall clock across room, each direction with SBA. 2x5 each direction with 5#AW on each foot. - standing alternating 180 degree turn with ball toss against wall in hallway, 2x10 each  direction with seated rest in between. SBA with LOB corrected with reaching to stabilize on wall and step strategy.  - ambulation ~ 100 feet down ramp with SPC and SBA from clinic to vehicle for safety.   Therapeutic exercise: to centralize symptoms and improve ROM, strength, muscular endurance, and activity tolerance required for successful completion of functional activities.  - forwards step up to 6 inch step, 2x10 with each foot leading and ipsilateral UE support. Needed cuing for sequencing and CGA for safety. 5# AW on each foot. Seated 1 min rest between sets.   Pt required multimodal cuing for proper technique and to facilitate improved neuromuscular control, strength, range of motion, and functional ability resulting in improved performance and form.     PATIENT EDUCATION:  Education details: Exercise purpose/form. Self management techniques. Education on HEP including handout  Person educated: Patient Education method: Explanation, demonstration, verbal cuing Education comprehension: verbalized and demonstrated understanding and needs further education     HOME EXERCISE PROGRAM: Access Code: [email or web address] URL: [email or web address] Date: 04/13/2022 Prepared by: Rosita Kea  Exercises - Tandem Stance  - 1 x daily - 2 sets - 1 minute hold - Corner Balance Feet Together With Eyes Closed  - 1 x daily - 2 sets - 1 min hold - Corner Balance Feet Together: Eyes Open With Head Turns  - 1 x daily - 2 sets - 20 reps - Sit to Stand Without Arm Support  - 1 x daily - 3 sets - 10 reps   ASSESSMENT:   CLINICAL IMPRESSION: Patient continues to have difficulty with changes of direction and levels during ambulation and increased fall risk. Session focused on interventions to improve gait stability, and balance with stepping and turning while improving overall activity tolerance through interval training for cardiopulmonary system. Patient required cuing and guarding for safety.  Patient would benefit from continued management of limiting condition by skilled physical therapist to address remaining impairments and functional limitations to work towards stated goals and return to PLOF or maximal functional independence.   From PT eval 04/11/2022:  Patient is a 71 y.o. female referred to outpatient physical therapy with a medical diagnosis of spinal stenosis of lumbar region with neurogenic claudication and imbalance who presents with signs and symptoms consistent with unsteadiness on feet, loss of sensation, and difficulty with strength and motor control of B lower legs as well as hip and back stiffness with weakness in right dorsiflexion and great toe extension. She also reported increased urinary incontinence since surgery where she leaks without urge, so she has been wearing diapers. Lower leg symptoms appear similar to that expected from peripheral neuropathy. R ankle/foot weakness suggests possible former or current compromise of L4 or L5 nerve root, but could also be related to neuropathy symptoms. Patient denies weakness or numbness in B lower legs prior to recent back surgery and and chart review lacks prior record  of involvement of patient's lower legs, ankles, or feet.  Patient's symptoms were unable to be worsened or relieved with exam today and PT was unable to establish a direct link between lumbar spine and lower leg numbness/weakness. Patient is also suffering from significant stooped posture with lack of hip and back extension ROM contributing. This is likely related to having difficulty with maintaining standing postures for a prolonged period related to spinal stenosis and back pain in standing postures. Patient presents with significant pain, ROM, sensation, balance, motor control, posture, joint stiffness, gait, muscle tension, muscle performance (strength/power/endurance) and activity tolerance impairments that are limiting ability to complete her usual activities  including anything that requires walking, standing, and/or upright balance including shopping, carrying, grocery shopping, navigating stairs, social outings, housework, yardwork, bathing and dressing without difficulty. Patient will benefit from skilled physical therapy intervention to address current body structure impairments and activity limitations to improve function and work towards goals set in current POC in order to return to prior level of function or maximal functional improvement.      OBJECTIVE IMPAIRMENTS Abnormal gait, decreased activity tolerance, decreased balance, decreased coordination, decreased endurance, decreased knowledge of condition, decreased knowledge of use of DME, decreased mobility, difficulty walking, decreased ROM, decreased strength, dizziness, hypomobility, increased edema, impaired perceived functional ability, impaired flexibility, impaired sensation, improper body mechanics, postural dysfunction, obesity, and pain.    ACTIVITY LIMITATIONS carrying, lifting, standing, squatting, stairs, transfers, bed mobility, continence, bathing, dressing, reach over head, locomotion level, and caring for others   PARTICIPATION LIMITATIONS: meal prep, cleaning, laundry, interpersonal relationship, shopping, community activity, yard work, and   anything that requires walking, standing, and/or upright balance including shopping, carrying, grocery shopping, navigating stairs, social outings, housework, yardwork, bathing and dressing, gardening, going to the movies.    PERSONAL FACTORS Age, Fitness, Time since onset of injury/illness/exacerbation, and 3+ comorbidities:   anxiety, arthritis, CAD with stents placed (monitored 1x a year by GP), depression, diastolic dysfunction, diverticulosis, hereditary lymphedema of B legs, history of angioedema, hyperlipidemia, HTN, lumbar spinal stenosis, s/P TAVR (05/11/2020), T2DM, Tubular adenoma of colon, varicose veins of both legs with edema,  bladder stimulator (after bladder damaged from attempts to break up kidney stones per pt), bilateral cataract surgery, B knee replacements (R 2018 with manipulation, L 2014), lumbar laminectomy/decompression/microdiscectomy at L3-5 (02/06/2022), urinary incontinence, former smoker are also affecting patient's functional outcome.    REHAB POTENTIAL: Good   CLINICAL DECISION MAKING: Evolving/moderate complexity   EVALUATION COMPLEXITY: Moderate     GOALS: Goals reviewed with patient? No   SHORT TERM GOALS: Target date: 04/25/2022   Patient will be independent with initial home exercise program for self-management of symptoms. Baseline: Initial HEP to be provided at visit 2 as appropriate (04/11/22); initial HEP provided at visit 2 (04/13/2022);  Goal status: Met     LONG TERM GOALS: Target date: 07/04/2022. Updated to 09/05/2021 on 06/13/2022.    Patient will be independent with a long-term home exercise program for self-management of symptoms.  Baseline: Initial HEP to be provided at visit 2 as appropriate (04/11/22); initial HEP provided at visit 2 (04/13/2022); continuing to participate (06/13/2022);  Goal status: In-progress   2.  Patient will demonstrate improved FOTO by equal or greater than 10 points to demonstrate improvement in overall condition and self-reported functional ability.  Baseline: to be measured visit 2 as appropriate (04/11/22); 40 at visit #2 (04/13/2022); 54 at visit #10 (06/13/2022);  Goal status: In-progress   3.  Patient will ambulate equal  or greater than 1000 feet on 6 Minute Walk Test with no AD to demonstrate improved ability to safely complete household and community ambulation without falls.  Baseline: to be tested visit 2 as appropriate (04/11/22); 710 feet with RW and mildly stooped posture, flexed knees. Reports her B LE felt less numb during last part of test. Reported B UE fatigue (04/13/2022);  912 feet while carrying SPC (not using it). Mildly stooped  posture and flexed knees. Limited by shortness of breath (06/13/2022);  Goal status: In-progress   4.  Patient will demonstrate the ability to stand in tandem stance for equal or greater than 30 seconds on each side to demonstrate improved static balance for tasks such as cooking and gardening.  Baseline:  R = 16 seconds, L = 5 seconds  (04/11/22);R = 11 seconds, L = 4 seconds (increased sway and shakiness in B LE bilaterally, 06/13/2022);  Goal status: In-progress   5.  Patient will complete community, work and/or recreational activities without limitation due to current condition.  Baseline: difficulty with anything that requires walking, standing, and/or upright balance including shopping, carrying, grocery shopping, navigating stairs, social outings, housework, yardwork, bathing and dressing, walking on uneven ground (04/11/22); rates her improvement 50% and does not feel like she is falling all the time (06/13/2022);  Goal status: In-progress       PLAN: PT FREQUENCY: 1-2x/week   PT DURATION: 12 weeks   PLANNED INTERVENTIONS: Therapeutic exercises, Therapeutic activity, Neuromuscular re-education, Balance training, Gait training, Patient/Family education, Joint mobilization, Stair training, DME instructions, Aquatic Therapy, Dry Needling, Electrical stimulation, Spinal mobilization, Cryotherapy, Moist heat, Manual therapy, and Re-evaluation.   PLAN FOR NEXT SESSION: update HEP as appropriate, progressive balance and functional/LE strengthening as appropriate.    Everlean Alstrom. Graylon Good, PT, DPT 06/27/22, 2:56 PM  [facility] [address] [place], [place] [address] P: [phone] I F: [phone]

## 2022-06-29 ENCOUNTER — Encounter: Payer: Self-pay | Admitting: Physical Therapy

## 2022-06-29 ENCOUNTER — Ambulatory Visit: Payer: Medicare Other | Admitting: Physical Therapy

## 2022-06-29 DIAGNOSIS — M25651 Stiffness of right hip, not elsewhere classified: Secondary | ICD-10-CM

## 2022-06-29 DIAGNOSIS — R2681 Unsteadiness on feet: Secondary | ICD-10-CM

## 2022-06-29 DIAGNOSIS — M5459 Other low back pain: Secondary | ICD-10-CM

## 2022-06-29 DIAGNOSIS — M6281 Muscle weakness (generalized): Secondary | ICD-10-CM

## 2022-06-29 DIAGNOSIS — R262 Difficulty in walking, not elsewhere classified: Secondary | ICD-10-CM

## 2022-06-29 DIAGNOSIS — R209 Unspecified disturbances of skin sensation: Secondary | ICD-10-CM

## 2022-06-29 NOTE — Therapy (Signed)
OUTPATIENT PHYSICAL THERAPY TREATMENT NOTE   Patient Name: Brandy Oneill MRN: [id number] DOB:January 24, 1951, 71 y.o., female Today's Date: 06/29/2022  PCP: Tracie Harrier, MD REFERRING PROVIDER: Meade Maw, MD  END OF SESSION:   PT End of Session - 06/29/22 1526     Visit Number 14    Number of Visits 24    Date for PT Re-Evaluation 09/05/22    Authorization Type MEDICARE PART B reporting period from 06/13/2022    Progress Note Due on Visit 10    PT Start Time 1526    PT Stop Time 1604    PT Time Calculation (min) 38 min    Equipment Utilized During Treatment Gait belt    Activity Tolerance Patient tolerated treatment well;Patient limited by fatigue    Behavior During Therapy WFL for tasks assessed/performed               Past Medical History:  Diagnosis Date   Anxiety    Arthritis    CAD (coronary artery disease) 04/14/2020   a.) RHC 04/14/2020: mod CAD with sev obstructive Dz in large dom RCA; staged PCI planned. b.) PCI 04/28/2020: DES (unknown type) placed to dRCA.   Depression    Diastolic dysfunction 69/45/0388   a.) TTE 01/06/2019: EF >55%; triv panvalvular regurgitation; G1DD. b.) TTE 01/26/2020: EF > 55%; mod LVH; triv MR/TR/PR; G1DD.   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 b.) TTE 01/26/2020: EF >55%; sev AS (MPG 47.1 mmHg). c.) RHC 04/14/2020: norm CI, PVR, RAP; PCWP 10. d.) s/p TAVR 05/11/2020. e.) TTE 06/01/2020: EF >55%; mild LVH; MPG 9 mmHg. f.) TTE 06/16/2021: EF >55%; mild LVH; GLS -11.6%; MPG 24 mmHg.   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   a.) 23 mm Edwards Sapien S3 bioprosthetic valve   T2DM (type 2 diabetes mellitus) ([place])    Tubular adenoma of colon    Varicose veins of both legs with edema    Past Surgical History:  Procedure Laterality Date   bladder stimulater  2014   CARDIAC VALVE REPLACEMENT     CATARACT EXTRACTION W/ INTRAOCULAR LENS IMPLANT Bilateral 2014   one eye done then the other eye done a month later   [place] N/A 06/26/2018   Procedure: COLONOSCOPY WITH PROPOFOL;  Surgeon: Toledo, Benay Pike, MD;  Location: [facility];  Service: Gastroenterology;  Laterality: N/A;   EYE SURGERY Right 2018   macular hole   JOINT REPLACEMENT Right 10/27/2016   JOINT REPLACEMENT Left 2014   KNEE CLOSED REDUCTION Right 11/29/2016   Procedure: CLOSED MANIPULATION KNEE;  Surgeon: Leanor Kail, MD;  Location: [facility] ORS;  Service: Orthopedics;  Laterality: Right;   LUMBAR LAMINECTOMY/DECOMPRESSION MICRODISCECTOMY N/A 02/06/2022   Procedure: L3-5 DECOMPRESSION;  Surgeon: Meade Maw, MD;  Location: [facility] ORS;  Service: Neurosurgery;  Laterality: N/A;   Patient Active Problem List   Diagnosis Date Noted   Lumbar stenosis 02/06/2022  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   CAD (coronary artery disease) 04/28/2020   S/P angioplasty with stent 04/28/2020   Nonrheumatic aortic valve stenosis 03/12/2020   Essential hypertension 02/06/2019   Hyperlipidemia 02/06/2019   Spinal stenosis of lumbar region with radiculopathy 09/27/2018   BMI 38.0-38.9,adult 09/26/2018   Back pain 09/05/2018   Diet-controlled type 2 diabetes mellitus ([place]) 03/27/2018   Postmenopausal 03/27/2018   DJD (degenerative joint disease) 02/04/2018   History of angioedema 09/17/2017   Hyperglycemia 09/17/2017   Lymphedema 02/04/2017   Chronic venous insufficiency 02/04/2017    Swelling of limb 02/04/2017   Pure hypercholesterolemia 10/02/2016   History of total knee arthroplasty 09/20/2016   Varicose veins of both legs with edema 03/25/2016   Abnormal mammogram 06/01/2015   Urinary incontinence in female 06/01/2015   Prediabetes 06/16/2014   Anxiety and depression 01/28/2014   Psoriasis 01/28/2014    REFERRING DIAG: spinal stenosis of lumbar region with neurogenic claudication, imbalance  THERAPY DIAG:  Other low back pain  Difficulty in walking, not elsewhere classified  Unsteadiness on feet  Muscle weakness (generalized)  Unspecified disturbances of skin sensation  Stiffness of right hip, not elsewhere classified  Rationale for Evaluation and Treatment: Rehabilitation  PERTINENT HISTORY: Patient is a 71 y.o. female who presents to outpatient physical therapy with a referral for medical diagnosis spinal stenosis of lumbar region with neurogenic claudication, imbalance. This patient's chief complaints consist of numbness and weakness in her B lower legs following L3-5 lumbar decompression including central laminectomy and bilateral medial facetectomies including foraminotomies on  02/06/22 and difficulty with mobility and balance leading to the following functional deficits: difficulty with anything that requires walking, standing, and/or upright balance including shopping, carrying, grocery shopping, navigating stairs, social outings, housework, yardwork, bathing and dressing. Relevant past medical history and comorbidities include anxiety, arthritis, CAD with stents placed (monitored 1x a year by GP), depression, diastolic dysfunction, diverticulosis, hereditary lymphedema of B legs, history of angioedema, hyperlipidemia, HTN, lumbar spinal stenosis, s/P TAVR (05/11/2020), T2DM, Tubular adenoma of colon, varicose veins of both legs with edema, bladder stimulator (after bladder damaged from attempts to break up kidney stones per pt), bilateral cataract surgery,  B knee replacements (R 2018 with manipulation, L 2014), lumbar laminectomy/decompression/microdiscectomy at L3-5 (02/06/2022), urinary incontinence, former smoker.  Patient denies hx of cancer, stroke, seizures, lung problems, unexplained weight loss, and osteoporosis. She endorses worsening urinary incontinence since back surgery; has a history of bladder damage that made it hard for her to get to control her urine once she felt urgency; now, she does not feel urgency since having her back surgery. She has a bladder stimulator that is not working since right before [employer].   PRECAUTIONS: do not lift anything over 25#, fall.   SUBJECTIVE: Patient arrives with [facility]. She states she is late because she fell asleep while at her sister's house taking care of her dogs. She denies pain currently and soreness after last PT session. She states she saw her PCP yesterday and all of her blood work was good. She does not have any new health updates except recommendation to take vitamin B12 and D that might help her numbness. Denies falls since last PT session. She reports she has a sinus infection but no fever or vomiting.   PAIN:  Are you having pain? Numbness in bilateral feet and ankles.   OBJECTIVE   TODAY'S TREATMENT  Neuromuscular Re-education: to improve, balance, postural strength, muscle activation patterns, and stabilization strength required  for functional activities: - ambulation over ground for speed  with no ADwith 5# AW on each foot and holding 2# DB in each hand with cuing to swing arms to improve gait pattern and balance. 3x200 feet with 1 min rest. CGA. Cuing for speed and taking larger, higher steps, arm swing.  - standing alternating 360 degree turns touching bar with B UE and naming position of second hand on wall clock across room, each direction with SBA. 1x10 each direction with 5#AW on each foot. - walking with 180 degree turns when called out by PT at random times. CGA-min A. ~ 3  min of walking.   Circuit:  - standing B heel raise with balls of feet on 2x4 board, B UE support, 3x15.  - sit <> stand with ball drop/retreive from 18 inch chair. 2x10 with 3kg med ball. SBA. Patient unsteady when bending forwards go get ball.   - ambulation ~ 100 feet down ramp with SPC and SBA from clinic to vehicle for safety.   Therapeutic exercise: to centralize symptoms and improve ROM, strength, muscular endurance, and activity tolerance required for successful completion of functional activities.  - forwards step up to 6 inch step, 2x10 with each foot leading and ipsilateral UE support. Needed cuing for sequencing and CGA for safety. 5# AW on each foot. Short standing rest between sets.   Pt required multimodal cuing for proper technique and to facilitate improved neuromuscular control, strength, range of motion, and functional ability resulting in improved performance and form.     PATIENT EDUCATION:  Education details: Exercise purpose/form. Self management techniques. Education on HEP including handout  Person educated: Patient Education method: Explanation, demonstration, verbal cuing Education comprehension: verbalized and demonstrated understanding and needs further education     HOME EXERCISE PROGRAM: Access Code: [email or web address] URL: [email or web address] Date: 04/13/2022 Prepared by: Rosita Kea  Exercises - Tandem Stance  - 1 x daily - 2 sets - 1 minute hold - Corner Balance Feet Together With Eyes Closed  - 1 x daily - 2 sets - 1 min hold - Corner Balance Feet Together: Eyes Open With Head Turns  - 1 x daily - 2 sets - 20 reps - Sit to Stand Without Arm Support  - 1 x daily - 3 sets - 10 reps   ASSESSMENT:   CLINICAL IMPRESSION: Patient with significantly improved activity tolerance today but continues to ambulate with very short careful steps with high level of unsteadiness on feet. Session focused on continued strengthening and exercises for balance,  turning, and taking larger steps. Patient tolerated well with less rests than last PT session. Plan to continue with interventions for improved gait stability, balance, and activity tolerance. Patient would benefit from continued management of limiting condition by skilled physical therapist to address remaining impairments and functional limitations to work towards stated goals and return to PLOF or maximal functional independence.   From PT eval 04/11/2022:  Patient is a 71 y.o. female referred to outpatient physical therapy with a medical diagnosis of spinal stenosis of lumbar region with neurogenic claudication and imbalance who presents with signs and symptoms consistent with unsteadiness on feet, loss of sensation, and difficulty with strength and motor control of B lower legs as well as hip and back stiffness with weakness in right dorsiflexion and great toe extension. She also reported increased urinary incontinence since surgery where she leaks without urge, so she has been wearing diapers. Lower leg symptoms appear similar to that expected from peripheral neuropathy.  R ankle/foot weakness suggests possible former or current compromise of L4 or L5 nerve root, but could also be related to neuropathy symptoms. Patient denies weakness or numbness in B lower legs prior to recent back surgery and and chart review lacks prior record of involvement of patient's lower legs, ankles, or feet.  Patient's symptoms were unable to be worsened or relieved with exam today and PT was unable to establish a direct link between lumbar spine and lower leg numbness/weakness. Patient is also suffering from significant stooped posture with lack of hip and back extension ROM contributing. This is likely related to having difficulty with maintaining standing postures for a prolonged period related to spinal stenosis and back pain in standing postures. Patient presents with significant pain, ROM, sensation, balance, motor control,  posture, joint stiffness, gait, muscle tension, muscle performance (strength/power/endurance) and activity tolerance impairments that are limiting ability to complete her usual activities including anything that requires walking, standing, and/or upright balance including shopping, carrying, grocery shopping, navigating stairs, social outings, housework, yardwork, bathing and dressing without difficulty. Patient will benefit from skilled physical therapy intervention to address current body structure impairments and activity limitations to improve function and work towards goals set in current POC in order to return to prior level of function or maximal functional improvement.      OBJECTIVE IMPAIRMENTS Abnormal gait, decreased activity tolerance, decreased balance, decreased coordination, decreased endurance, decreased knowledge of condition, decreased knowledge of use of DME, decreased mobility, difficulty walking, decreased ROM, decreased strength, dizziness, hypomobility, increased edema, impaired perceived functional ability, impaired flexibility, impaired sensation, improper body mechanics, postural dysfunction, obesity, and pain.    ACTIVITY LIMITATIONS carrying, lifting, standing, squatting, stairs, transfers, bed mobility, continence, bathing, dressing, reach over head, locomotion level, and caring for others   PARTICIPATION LIMITATIONS: meal prep, cleaning, laundry, interpersonal relationship, shopping, community activity, yard work, and   anything that requires walking, standing, and/or upright balance including shopping, carrying, grocery shopping, navigating stairs, social outings, housework, yardwork, bathing and dressing, gardening, going to the movies.    PERSONAL FACTORS Age, Fitness, Time since onset of injury/illness/exacerbation, and 3+ comorbidities:   anxiety, arthritis, CAD with stents placed (monitored 1x a year by GP), depression, diastolic dysfunction, diverticulosis, hereditary  lymphedema of B legs, history of angioedema, hyperlipidemia, HTN, lumbar spinal stenosis, s/P TAVR (05/11/2020), T2DM, Tubular adenoma of colon, varicose veins of both legs with edema, bladder stimulator (after bladder damaged from attempts to break up kidney stones per pt), bilateral cataract surgery, B knee replacements (R 2018 with manipulation, L 2014), lumbar laminectomy/decompression/microdiscectomy at L3-5 (02/06/2022), urinary incontinence, former smoker are also affecting patient's functional outcome.    REHAB POTENTIAL: Good   CLINICAL DECISION MAKING: Evolving/moderate complexity   EVALUATION COMPLEXITY: Moderate     GOALS: Goals reviewed with patient? No   SHORT TERM GOALS: Target date: 04/25/2022   Patient will be independent with initial home exercise program for self-management of symptoms. Baseline: Initial HEP to be provided at visit 2 as appropriate (04/11/22); initial HEP provided at visit 2 (04/13/2022);  Goal status: Met     LONG TERM GOALS: Target date: 07/04/2022. Updated to 09/05/2021 on 06/13/2022.    Patient will be independent with a long-term home exercise program for self-management of symptoms.  Baseline: Initial HEP to be provided at visit 2 as appropriate (04/11/22); initial HEP provided at visit 2 (04/13/2022); continuing to participate (06/13/2022);  Goal status: In-progress   2.  Patient will demonstrate improved FOTO by equal or greater than  10 points to demonstrate improvement in overall condition and self-reported functional ability.  Baseline: to be measured visit 2 as appropriate (04/11/22); 40 at visit #2 (04/13/2022); 54 at visit #10 (06/13/2022);  Goal status: In-progress   3.  Patient will ambulate equal or greater than 1000 feet on 6 Minute Walk Test with no AD to demonstrate improved ability to safely complete household and community ambulation without falls.  Baseline: to be tested visit 2 as appropriate (04/11/22); 710 feet with RW and mildly  stooped posture, flexed knees. Reports her B LE felt less numb during last part of test. Reported B UE fatigue (04/13/2022);  912 feet while carrying SPC (not using it). Mildly stooped posture and flexed knees. Limited by shortness of breath (06/13/2022);  Goal status: In-progress   4.  Patient will demonstrate the ability to stand in tandem stance for equal or greater than 30 seconds on each side to demonstrate improved static balance for tasks such as cooking and gardening.  Baseline:  R = 16 seconds, L = 5 seconds  (04/11/22);R = 11 seconds, L = 4 seconds (increased sway and shakiness in B LE bilaterally, 06/13/2022);  Goal status: In-progress   5.  Patient will complete community, work and/or recreational activities without limitation due to current condition.  Baseline: difficulty with anything that requires walking, standing, and/or upright balance including shopping, carrying, grocery shopping, navigating stairs, social outings, housework, yardwork, bathing and dressing, walking on uneven ground (04/11/22); rates her improvement 50% and does not feel like she is falling all the time (06/13/2022);  Goal status: In-progress       PLAN: PT FREQUENCY: 1-2x/week   PT DURATION: 12 weeks   PLANNED INTERVENTIONS: Therapeutic exercises, Therapeutic activity, Neuromuscular re-education, Balance training, Gait training, Patient/Family education, Joint mobilization, Stair training, DME instructions, Aquatic Therapy, Dry Needling, Electrical stimulation, Spinal mobilization, Cryotherapy, Moist heat, Manual therapy, and Re-evaluation.   PLAN FOR NEXT SESSION: update HEP as appropriate, progressive balance and functional/LE strengthening as appropriate.    Everlean Alstrom. Graylon Good, PT, DPT 06/29/22, 7:41 PM  [facility] [address] [place], [place] [address] P: [phone] I F: [phone]

## 2022-07-04 ENCOUNTER — Encounter: Payer: Self-pay | Admitting: Physical Therapy

## 2022-07-04 ENCOUNTER — Ambulatory Visit: Payer: Medicare Other | Admitting: Physical Therapy

## 2022-07-04 DIAGNOSIS — R209 Unspecified disturbances of skin sensation: Secondary | ICD-10-CM

## 2022-07-04 DIAGNOSIS — M25651 Stiffness of right hip, not elsewhere classified: Secondary | ICD-10-CM

## 2022-07-04 DIAGNOSIS — M25652 Stiffness of left hip, not elsewhere classified: Secondary | ICD-10-CM

## 2022-07-04 DIAGNOSIS — R262 Difficulty in walking, not elsewhere classified: Secondary | ICD-10-CM

## 2022-07-04 DIAGNOSIS — R293 Abnormal posture: Secondary | ICD-10-CM

## 2022-07-04 DIAGNOSIS — R2681 Unsteadiness on feet: Secondary | ICD-10-CM

## 2022-07-04 DIAGNOSIS — M5459 Other low back pain: Secondary | ICD-10-CM | POA: Diagnosis not present

## 2022-07-04 NOTE — Therapy (Signed)
OUTPATIENT PHYSICAL THERAPY TREATMENT NOTE   Patient Name: Brandy Oneill MRN: [id number] DOB:February 10, 1951, 71 y.o., female Today's Date: 07/04/2022  PCP: Tracie Harrier, MD REFERRING PROVIDER: Meade Maw, MD  END OF SESSION:   PT End of Session - 07/04/22 1502     Visit Number 15    Number of Visits 24    Date for PT Re-Evaluation 09/05/22    Authorization Type MEDICARE PART B reporting period from 06/13/2022    Progress Note Due on Visit 10    PT Start Time 1502    PT Stop Time 1541    PT Time Calculation (min) 39 min    Equipment Utilized During Treatment Gait belt    Activity Tolerance Patient tolerated treatment well;Patient limited by fatigue    Behavior During Therapy WFL for tasks assessed/performed               Past Medical History:  Diagnosis Date   Anxiety    Arthritis    CAD (coronary artery disease) 04/14/2020   a.) RHC 04/14/2020: mod CAD with sev obstructive Dz in large dom RCA; staged PCI planned. b.) PCI 04/28/2020: DES (unknown type) placed to dRCA.   Depression    Diastolic dysfunction 86/76/7209   a.) TTE 01/06/2019: EF >55%; triv panvalvular regurgitation; G1DD. b.) TTE 01/26/2020: EF > 55%; mod LVH; triv MR/TR/PR; G1DD.   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 b.) TTE 01/26/2020: EF >55%; sev AS (MPG 47.1 mmHg). c.) RHC 04/14/2020: norm CI, PVR, RAP; PCWP 10. d.) s/p TAVR 05/11/2020. e.) TTE 06/01/2020: EF >55%; mild LVH; MPG 9 mmHg. f.) TTE 06/16/2021: EF >55%; mild LVH; GLS -11.6%; MPG 24 mmHg.   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   a.) 23 mm Edwards Sapien S3 bioprosthetic valve   T2DM (type 2 diabetes mellitus) ([place])    Tubular adenoma of colon    Varicose veins of both legs with edema    Past Surgical History:  Procedure Laterality Date   bladder stimulater  2014   CARDIAC VALVE REPLACEMENT     CATARACT EXTRACTION W/ INTRAOCULAR LENS IMPLANT Bilateral 2014   one eye done then the other eye done a month later   Welcome N/A 06/26/2018   Procedure: COLONOSCOPY WITH PROPOFOL;  Surgeon: Toledo, Benay Pike, MD;  Location: [facility];  Service: Gastroenterology;  Laterality: N/A;   EYE SURGERY Right 2018   macular hole   JOINT REPLACEMENT Right 10/27/2016   JOINT REPLACEMENT Left 2014   KNEE CLOSED REDUCTION Right 11/29/2016   Procedure: CLOSED MANIPULATION KNEE;  Surgeon: Leanor Kail, MD;  Location: [facility] ORS;  Service: Orthopedics;  Laterality: Right;   LUMBAR LAMINECTOMY/DECOMPRESSION MICRODISCECTOMY N/A 02/06/2022   Procedure: L3-5 DECOMPRESSION;  Surgeon: Meade Maw, MD;  Location: [facility] ORS;  Service: Neurosurgery;  Laterality: N/A;   Patient Active Problem List   Diagnosis Date Noted   Lumbar stenosis 02/06/2022  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   CAD (coronary artery disease) 04/28/2020   S/P angioplasty with stent 04/28/2020   Nonrheumatic aortic valve stenosis 03/12/2020   Essential hypertension 02/06/2019   Hyperlipidemia 02/06/2019   Spinal stenosis of lumbar region with radiculopathy 09/27/2018   BMI 38.0-38.9,adult 09/26/2018   Back pain 09/05/2018   Diet-controlled type 2 diabetes mellitus ([place]) 03/27/2018   Postmenopausal 03/27/2018   DJD (degenerative joint disease) 02/04/2018   History of angioedema 09/17/2017   Hyperglycemia 09/17/2017   Lymphedema 02/04/2017   Chronic venous insufficiency 02/04/2017    Swelling of limb 02/04/2017   Pure hypercholesterolemia 10/02/2016   History of total knee arthroplasty 09/20/2016   Varicose veins of both legs with edema 03/25/2016   Abnormal mammogram 06/01/2015   Urinary incontinence in female 06/01/2015   Prediabetes 06/16/2014   Anxiety and depression 01/28/2014   Psoriasis 01/28/2014    REFERRING DIAG: spinal stenosis of lumbar region with neurogenic claudication, imbalance  THERAPY DIAG:  Other low back pain  Difficulty in walking, not elsewhere classified  Unsteadiness on feet  Unspecified disturbances of skin sensation  Stiffness of right hip, not elsewhere classified  Stiffness of left hip, not elsewhere classified  Abnormal posture  Rationale for Evaluation and Treatment: Rehabilitation  PERTINENT HISTORY: Patient is a 71 y.o. female who presents to outpatient physical therapy with a referral for medical diagnosis spinal stenosis of lumbar region with neurogenic claudication, imbalance. This patient's chief complaints consist of numbness and weakness in her B lower legs following L3-5 lumbar decompression including central laminectomy and bilateral medial facetectomies including foraminotomies on  02/06/22 and difficulty with mobility and balance leading to the following functional deficits: difficulty with anything that requires walking, standing, and/or upright balance including shopping, carrying, grocery shopping, navigating stairs, social outings, housework, yardwork, bathing and dressing. Relevant past medical history and comorbidities include anxiety, arthritis, CAD with stents placed (monitored 1x a year by GP), depression, diastolic dysfunction, diverticulosis, hereditary lymphedema of B legs, history of angioedema, hyperlipidemia, HTN, lumbar spinal stenosis, s/P TAVR (05/11/2020), T2DM, Tubular adenoma of colon, varicose veins of both legs with edema, bladder stimulator (after bladder damaged from attempts to break up kidney stones  per pt), bilateral cataract surgery, B knee replacements (R 2018 with manipulation, L 2014), lumbar laminectomy/decompression/microdiscectomy at L3-5 (02/06/2022), urinary incontinence, former smoker.  Patient denies hx of cancer, stroke, seizures, lung problems, unexplained weight loss, and osteoporosis. She endorses worsening urinary incontinence since back surgery; has a history of bladder damage that made it hard for her to get to control her urine once she felt urgency; now, she does not feel urgency since having her back surgery. She has a bladder stimulator that is not working since right before [employer].   PRECAUTIONS: do not lift anything over 25#, fall.   SUBJECTIVE: Patient arrives with [facility]. She states she is feeling well and has no pain but continues to have numbness in bilateral lower legs and feet. She states the numbness is getting better and she can tell because she doesn't feel it as much and she can feel her legs better.   PAIN:  Are you having pain? Numbness in bilateral feet and ankles and bilateral lower legs.   OBJECTIVE   TODAY'S TREATMENT  Neuromuscular Re-education: to improve, balance, postural strength, muscle activation patterns, and stabilization strength required for functional activities: - ambulation over ground for speed  with no AD with 5# AW on each foot and holding 2# DB in each hand  with cuing to swing arms to improve gait pattern and balance. 3x200 feet with 1 min rest. CGA. Cuing for speed and taking larger, higher steps, arm swing.  - ambulation ~ 100 feet down ramp with SPC and SBA from clinic to vehicle for safety.   Therapeutic exercise: to centralize symptoms and improve ROM, strength, muscular endurance, and activity tolerance required for successful completion of functional activities.  - floor to stand transfer with chair and min A - education and trial of various desensitization techniques for bilateral anterior knees.   Pt required  multimodal cuing for proper technique and to facilitate improved neuromuscular control, strength, range of motion, and functional ability resulting in improved performance and form.     PATIENT EDUCATION:  Education details: Exercise purpose/form. Self management techniques. Education on HEP including handout  Person educated: Patient Education method: Explanation, demonstration, verbal cuing Education comprehension: verbalized and demonstrated understanding and needs further education     HOME EXERCISE PROGRAM: Access Code: [email or web address] URL: [email or web address] Date: 04/13/2022 Prepared by: Rosita Kea  Exercises - Tandem Stance  - 1 x daily - 2 sets - 1 minute hold - Corner Balance Feet Together With Eyes Closed  - 1 x daily - 2 sets - 1 min hold - Corner Balance Feet Together: Eyes Open With Head Turns  - 1 x daily - 2 sets - 20 reps - Sit to Stand Without Arm Support  - 1 x daily - 3 sets - 10 reps   ASSESSMENT:   CLINICAL IMPRESSION: Patient arrives with continued report of improved numbness in her lower legs. Session continued to focus on improving functional mobility and balance. Patient requested to complete floor transfer which was completed with significant difficulty due to pain in B anterior knees with weight bearing and difficulty with motor control and strength. She needed multiple attempts to get up from the floor.  Patient was educated in desensitization techniques for anterior knees. Patient continues to require assistance with exercise selection, physical support for safety, and cuing to complete exercises safely and effectively. Patient would benefit from continued management of limiting condition by skilled physical therapist to address remaining impairments and functional limitations to work towards stated goals and return to PLOF or maximal functional independence.    From PT eval 04/11/2022:  Patient is a 71 y.o. female referred to outpatient physical  therapy with a medical diagnosis of spinal stenosis of lumbar region with neurogenic claudication and imbalance who presents with signs and symptoms consistent with unsteadiness on feet, loss of sensation, and difficulty with strength and motor control of B lower legs as well as hip and back stiffness with weakness in right dorsiflexion and great toe extension. She also reported increased urinary incontinence since surgery where she leaks without urge, so she has been wearing diapers. Lower leg symptoms appear similar to that expected from peripheral neuropathy. R ankle/foot weakness suggests possible former or current compromise of L4 or L5 nerve root, but could also be related to neuropathy symptoms. Patient denies weakness or numbness in B lower legs prior to recent back surgery and and chart review lacks prior record of involvement of patient's lower legs, ankles, or feet.  Patient's symptoms were unable to be worsened or relieved with exam today and PT was unable to establish a direct link between lumbar spine and lower leg numbness/weakness. Patient is also suffering from significant stooped posture with lack of hip and back extension ROM contributing. This is likely related to having difficulty with maintaining standing  postures for a prolonged period related to spinal stenosis and back pain in standing postures. Patient presents with significant pain, ROM, sensation, balance, motor control, posture, joint stiffness, gait, muscle tension, muscle performance (strength/power/endurance) and activity tolerance impairments that are limiting ability to complete her usual activities including anything that requires walking, standing, and/or upright balance including shopping, carrying, grocery shopping, navigating stairs, social outings, housework, yardwork, bathing and dressing without difficulty. Patient will benefit from skilled physical therapy intervention to address current body structure impairments and  activity limitations to improve function and work towards goals set in current POC in order to return to prior level of function or maximal functional improvement.      OBJECTIVE IMPAIRMENTS Abnormal gait, decreased activity tolerance, decreased balance, decreased coordination, decreased endurance, decreased knowledge of condition, decreased knowledge of use of DME, decreased mobility, difficulty walking, decreased ROM, decreased strength, dizziness, hypomobility, increased edema, impaired perceived functional ability, impaired flexibility, impaired sensation, improper body mechanics, postural dysfunction, obesity, and pain.    ACTIVITY LIMITATIONS carrying, lifting, standing, squatting, stairs, transfers, bed mobility, continence, bathing, dressing, reach over head, locomotion level, and caring for others   PARTICIPATION LIMITATIONS: meal prep, cleaning, laundry, interpersonal relationship, shopping, community activity, yard work, and   anything that requires walking, standing, and/or upright balance including shopping, carrying, grocery shopping, navigating stairs, social outings, housework, yardwork, bathing and dressing, gardening, going to the movies.    PERSONAL FACTORS Age, Fitness, Time since onset of injury/illness/exacerbation, and 3+ comorbidities:   anxiety, arthritis, CAD with stents placed (monitored 1x a year by GP), depression, diastolic dysfunction, diverticulosis, hereditary lymphedema of B legs, history of angioedema, hyperlipidemia, HTN, lumbar spinal stenosis, s/P TAVR (05/11/2020), T2DM, Tubular adenoma of colon, varicose veins of both legs with edema, bladder stimulator (after bladder damaged from attempts to break up kidney stones per pt), bilateral cataract surgery, B knee replacements (R 2018 with manipulation, L 2014), lumbar laminectomy/decompression/microdiscectomy at L3-5 (02/06/2022), urinary incontinence, former smoker are also affecting patient's functional outcome.    REHAB  POTENTIAL: Good   CLINICAL DECISION MAKING: Evolving/moderate complexity   EVALUATION COMPLEXITY: Moderate     GOALS: Goals reviewed with patient? No   SHORT TERM GOALS: Target date: 04/25/2022   Patient will be independent with initial home exercise program for self-management of symptoms. Baseline: Initial HEP to be provided at visit 2 as appropriate (04/11/22); initial HEP provided at visit 2 (04/13/2022);  Goal status: Met     LONG TERM GOALS: Target date: 07/04/2022. Updated to 09/05/2021 on 06/13/2022.    Patient will be independent with a long-term home exercise program for self-management of symptoms.  Baseline: Initial HEP to be provided at visit 2 as appropriate (04/11/22); initial HEP provided at visit 2 (04/13/2022); continuing to participate (06/13/2022);  Goal status: In-progress   2.  Patient will demonstrate improved FOTO by equal or greater than 10 points to demonstrate improvement in overall condition and self-reported functional ability.  Baseline: to be measured visit 2 as appropriate (04/11/22); 40 at visit #2 (04/13/2022); 54 at visit #10 (06/13/2022);  Goal status: In-progress   3.  Patient will ambulate equal or greater than 1000 feet on 6 Minute Walk Test with no AD to demonstrate improved ability to safely complete household and community ambulation without falls.  Baseline: to be tested visit 2 as appropriate (04/11/22); 710 feet with RW and mildly stooped posture, flexed knees. Reports her B LE felt less numb during last part of test. Reported B UE fatigue (04/13/2022);  912  feet while carrying SPC (not using it). Mildly stooped posture and flexed knees. Limited by shortness of breath (06/13/2022);  Goal status: In-progress   4.  Patient will demonstrate the ability to stand in tandem stance for equal or greater than 30 seconds on each side to demonstrate improved static balance for tasks such as cooking and gardening.  Baseline:  R = 16 seconds, L = 5 seconds   (04/11/22);R = 11 seconds, L = 4 seconds (increased sway and shakiness in B LE bilaterally, 06/13/2022);  Goal status: In-progress   5.  Patient will complete community, work and/or recreational activities without limitation due to current condition.  Baseline: difficulty with anything that requires walking, standing, and/or upright balance including shopping, carrying, grocery shopping, navigating stairs, social outings, housework, yardwork, bathing and dressing, walking on uneven ground (04/11/22); rates her improvement 50% and does not feel like she is falling all the time (06/13/2022);  Goal status: In-progress       PLAN: PT FREQUENCY: 1-2x/week   PT DURATION: 12 weeks   PLANNED INTERVENTIONS: Therapeutic exercises, Therapeutic activity, Neuromuscular re-education, Balance training, Gait training, Patient/Family education, Joint mobilization, Stair training, DME instructions, Aquatic Therapy, Dry Needling, Electrical stimulation, Spinal mobilization, Cryotherapy, Moist heat, Manual therapy, and Re-evaluation.   PLAN FOR NEXT SESSION: update HEP as appropriate, progressive balance and functional/LE strengthening as appropriate.    Everlean Alstrom. Graylon Good, PT, DPT 07/04/22, 3:44 PM  [facility] [address] [place], [place] [address] P: [phone] I F: [phone]

## 2022-07-06 ENCOUNTER — Ambulatory Visit: Payer: Medicare Other | Admitting: Physical Therapy

## 2022-07-10 ENCOUNTER — Ambulatory Visit: Payer: Medicare Other | Admitting: Physical Therapy

## 2022-07-11 ENCOUNTER — Encounter: Payer: Medicare Other | Admitting: Physical Therapy

## 2022-07-18 ENCOUNTER — Encounter: Payer: Self-pay | Admitting: Physical Therapy

## 2022-07-18 ENCOUNTER — Ambulatory Visit: Payer: Medicare Other | Admitting: Physical Therapy

## 2022-07-18 DIAGNOSIS — M25651 Stiffness of right hip, not elsewhere classified: Secondary | ICD-10-CM

## 2022-07-18 DIAGNOSIS — M6281 Muscle weakness (generalized): Secondary | ICD-10-CM

## 2022-07-18 DIAGNOSIS — R262 Difficulty in walking, not elsewhere classified: Secondary | ICD-10-CM

## 2022-07-18 DIAGNOSIS — M5459 Other low back pain: Secondary | ICD-10-CM

## 2022-07-18 DIAGNOSIS — R2681 Unsteadiness on feet: Secondary | ICD-10-CM

## 2022-07-18 DIAGNOSIS — R293 Abnormal posture: Secondary | ICD-10-CM

## 2022-07-18 DIAGNOSIS — R209 Unspecified disturbances of skin sensation: Secondary | ICD-10-CM

## 2022-07-18 DIAGNOSIS — M25652 Stiffness of left hip, not elsewhere classified: Secondary | ICD-10-CM

## 2022-07-18 NOTE — Therapy (Signed)
OUTPATIENT PHYSICAL THERAPY TREATMENT NOTE   Patient Name: Brandy Oneill MRN: [id number] DOB:07-08-1951, 71 y.o., female Today's Date: 07/18/2022  PCP: Tracie Harrier, MD REFERRING PROVIDER: Meade Maw, MD  END OF SESSION:   PT End of Session - 07/18/22 1505     Visit Number 16    Number of Visits 24    Date for PT Re-Evaluation 09/05/22    Authorization Type MEDICARE PART B reporting period from 06/13/2022    Progress Note Due on Visit 10    PT Start Time 1519    PT Stop Time 1557    PT Time Calculation (min) 38 min    Equipment Utilized During Treatment Gait belt    Activity Tolerance Patient tolerated treatment well;Patient limited by fatigue    Behavior During Therapy WFL for tasks assessed/performed               Past Medical History:  Diagnosis Date   Anxiety    Arthritis    CAD (coronary artery disease) 04/14/2020   a.) RHC 04/14/2020: mod CAD with sev obstructive Dz in large dom RCA; staged PCI planned. b.) PCI 04/28/2020: DES (unknown type) placed to dRCA.   Depression    Diastolic dysfunction 76/28/3151   a.) TTE 01/06/2019: EF >55%; triv panvalvular regurgitation; G1DD. b.) TTE 01/26/2020: EF > 55%; mod LVH; triv MR/TR/PR; G1DD.   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 b.) TTE 01/26/2020: EF >55%; sev AS (MPG 47.1 mmHg). c.) RHC 04/14/2020: norm CI, PVR, RAP; PCWP 10. d.) s/p TAVR 05/11/2020. e.) TTE 06/01/2020: EF >55%; mild LVH; MPG 9 mmHg. f.) TTE 06/16/2021: EF >55%; mild LVH; GLS -11.6%; MPG 24 mmHg.   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   a.) 23 mm Edwards Sapien S3 bioprosthetic valve   T2DM (type 2 diabetes mellitus) ([place])    Tubular adenoma of colon    Varicose veins of both legs with edema    Past Surgical History:  Procedure Laterality Date   bladder stimulater  2014   CARDIAC VALVE REPLACEMENT     CATARACT EXTRACTION W/ INTRAOCULAR LENS IMPLANT Bilateral 2014   one eye done then the other eye done a month later   [place] N/A 06/26/2018   Procedure: COLONOSCOPY WITH PROPOFOL;  Surgeon: Toledo, Benay Pike, MD;  Location: [facility] END[facility];  Service: Gastroenterology;  Laterality: N/A;   EYE SURGERY Right 2018   macular hole   JOINT REPLACEMENT Right 10/27/2016   JOINT REPLACEMENT Left 2014   KNEE CLOSED REDUCTION Right 11/29/2016   Procedure: CLOSED MANIPULATION KNEE;  Surgeon: Leanor Kail, MD;  Location: [facility] ORS;  Service: Orthopedics;  Laterality: Right;   LUMBAR LAMINECTOMY/DECOMPRESSION MICRODISCECTOMY N/A 02/06/2022   Procedure: L3-5 DECOMPRESSION;  Surgeon: Meade Maw, MD;  Location: [facility] ORS;  Service: Neurosurgery;  Laterality: N/A;   Patient Active Problem List   Diagnosis Date Noted   Lumbar stenosis 02/06/2022  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   CAD (coronary artery disease) 04/28/2020   S/P angioplasty with stent 04/28/2020   Nonrheumatic aortic valve stenosis 03/12/2020   Essential hypertension 02/06/2019   Hyperlipidemia 02/06/2019   Spinal stenosis of lumbar region with radiculopathy 09/27/2018   BMI 38.0-38.9,adult 09/26/2018   Back pain 09/05/2018   Diet-controlled type 2 diabetes mellitus ([place]) 03/27/2018   Postmenopausal 03/27/2018   DJD (degenerative joint disease) 02/04/2018   History of angioedema 09/17/2017   Hyperglycemia 09/17/2017   Lymphedema 02/04/2017   Chronic venous insufficiency 02/04/2017    Swelling of limb 02/04/2017   Pure hypercholesterolemia 10/02/2016   History of total knee arthroplasty 09/20/2016   Varicose veins of both legs with edema 03/25/2016   Abnormal mammogram 06/01/2015   Urinary incontinence in female 06/01/2015   Prediabetes 06/16/2014   Anxiety and depression 01/28/2014   Psoriasis 01/28/2014    REFERRING DIAG: spinal stenosis of lumbar region with neurogenic claudication, imbalance  THERAPY DIAG:  Other low back pain  Difficulty in walking, not elsewhere classified  Unsteadiness on feet  Unspecified disturbances of skin sensation  Stiffness of right hip, not elsewhere classified  Stiffness of left hip, not elsewhere classified  Abnormal posture  Muscle weakness (generalized)  Rationale for Evaluation and Treatment: Rehabilitation  PERTINENT HISTORY: Patient is a 71 y.o. female who presents to outpatient physical therapy with a referral for medical diagnosis spinal stenosis of lumbar region with neurogenic claudication, imbalance. This patient's chief complaints consist of numbness and weakness in her B lower legs following L3-5 lumbar decompression including central laminectomy and bilateral medial facetectomies including foraminotomies on  02/06/22 and difficulty with mobility and balance leading to the following functional deficits: difficulty with anything that requires walking, standing, and/or upright balance including shopping, carrying, grocery shopping, navigating stairs, social outings, housework, yardwork, bathing and dressing. Relevant past medical history and comorbidities include anxiety, arthritis, CAD with stents placed (monitored 1x a year by GP), depression, diastolic dysfunction, diverticulosis, hereditary lymphedema of B legs, history of angioedema, hyperlipidemia, HTN, lumbar spinal stenosis, s/P TAVR (05/11/2020), T2DM, Tubular adenoma of colon, varicose veins of both legs with edema, bladder stimulator (after bladder damaged from  attempts to break up kidney stones per pt), bilateral cataract surgery, B knee replacements (R 2018 with manipulation, L 2014), lumbar laminectomy/decompression/microdiscectomy at L3-5 (02/06/2022), urinary incontinence, former smoker.  Patient denies hx of cancer, stroke, seizures, lung problems, unexplained weight loss, and osteoporosis. She endorses worsening urinary incontinence since back surgery; has a history of bladder damage that made it hard for her to get to control her urine once she felt urgency; now, she does not feel urgency since having her back surgery. She has a bladder stimulator that is not working since right before [employer].   PRECAUTIONS: do not lift anything over 25#, fall.   SUBJECTIVE: Patient arrives with [facility]. She states she missed her trip to [place] for Thanksgiving because she felt sick after getting two vaccinations. She denies current pain. She continues to feel her leg numbness is better. She state she still has numbness in her bilateral feet and ankles. She denies falls since last PT session. She is going to visit her daughter in [place] later this week.   PAIN:  Are you having pain? Numbness in bilateral feet and ankles and bilateral lower legs.   OBJECTIVE   TODAY'S TREATMENT  Neuromuscular Re-education: to improve, balance, postural strength, muscle activation patterns, and stabilization strength required for functional activities: - ambulation over  ground for speed  with no AD with 5# AW on each foot and holding 2# DB in each hand with cuing to swing arms to improve gait pattern and balance. 2x300 feet with 1 min rest. CGA. Cuing for speed and taking larger, higher steps, arm swing.  - sit <> stand with ball drop/retreive from 18 inch chair. 2x10 with 3kg med ball. SBA. Patient unsteady at times when bending forwards go get ball.   - walking with 180 degree turns when called out by PT at random times progressing to with shoulder tap to cue which direction to  turn,  CGA-min A. 2x2 min of walking.  - standing alternating 360 degree turns touching bar with B UE and naming position of second hand on wall clock across room, each direction with SBA. 1x10 each direction with 5#AW on each foot. Cuing for counting   Therapeutic exercise: to centralize symptoms and improve ROM, strength, muscular endurance, and activity tolerance required for successful completion of functional activities.  - standing B heel raise with balls of feet on 2x4 board, B UE support, 3x15.  - forwards step up to 6 inch step, 1x10 with each foot leading and ipsilateral UE support. Needed cuing for sequencing and CGA for safety. 5# AW on each foot. Short standing rests between sets.   Pt required multimodal cuing for proper technique and to facilitate improved neuromuscular control, strength, range of motion, and functional ability resulting in improved performance and form.     PATIENT EDUCATION:  Education details: Exercise purpose/form. Self management techniques. Education on HEP including handout  Person educated: Patient Education method: Explanation, demonstration, verbal cuing Education comprehension: verbalized and demonstrated understanding and needs further education     HOME EXERCISE PROGRAM: Access Code: [email or web address] URL: [email or web address] Date: 04/13/2022 Prepared by: Rosita Kea  Exercises - Tandem Stance  - 1 x daily - 2 sets - 1 minute hold - Corner Balance Feet Together With Eyes Closed  - 1 x daily - 2 sets - 1 min hold - Corner Balance Feet Together: Eyes Open With Head Turns  - 1 x daily - 2 sets - 20 reps - Sit to Stand Without Arm Support  - 1 x daily - 3 sets - 10 reps   ASSESSMENT:   CLINICAL IMPRESSION: Patient arrives to PT after being off last week. She continued working on improving her balance, gait, and functional strength and activity tolerance. She continues to need cuing from PT for form and guarding for safety. Rest breaks  were kept 1 min or under unless extension was requested by patient (still short) to improve cardiorespiratory benefit. Particular attention was paid to to turns and activities requiring lifting her feet. Plan to continue with similar interventions next session as tolerated. Patient would benefit from continued management of limiting condition by skilled physical therapist to address remaining impairments and functional limitations to work towards stated goals and return to PLOF or maximal functional independence.   From PT eval 04/11/2022:  Patient is a 71 y.o. female referred to outpatient physical therapy with a medical diagnosis of spinal stenosis of lumbar region with neurogenic claudication and imbalance who presents with signs and symptoms consistent with unsteadiness on feet, loss of sensation, and difficulty with strength and motor control of B lower legs as well as hip and back stiffness with weakness in right dorsiflexion and great toe extension. She also reported increased urinary incontinence since surgery where she leaks without urge, so she has been wearing diapers. Lower  leg symptoms appear similar to that expected from peripheral neuropathy. R ankle/foot weakness suggests possible former or current compromise of L4 or L5 nerve root, but could also be related to neuropathy symptoms. Patient denies weakness or numbness in B lower legs prior to recent back surgery and and chart review lacks prior record of involvement of patient's lower legs, ankles, or feet.  Patient's symptoms were unable to be worsened or relieved with exam today and PT was unable to establish a direct link between lumbar spine and lower leg numbness/weakness. Patient is also suffering from significant stooped posture with lack of hip and back extension ROM contributing. This is likely related to having difficulty with maintaining standing postures for a prolonged period related to spinal stenosis and back pain in standing  postures. Patient presents with significant pain, ROM, sensation, balance, motor control, posture, joint stiffness, gait, muscle tension, muscle performance (strength/power/endurance) and activity tolerance impairments that are limiting ability to complete her usual activities including anything that requires walking, standing, and/or upright balance including shopping, carrying, grocery shopping, navigating stairs, social outings, housework, yardwork, bathing and dressing without difficulty. Patient will benefit from skilled physical therapy intervention to address current body structure impairments and activity limitations to improve function and work towards goals set in current POC in order to return to prior level of function or maximal functional improvement.      OBJECTIVE IMPAIRMENTS Abnormal gait, decreased activity tolerance, decreased balance, decreased coordination, decreased endurance, decreased knowledge of condition, decreased knowledge of use of DME, decreased mobility, difficulty walking, decreased ROM, decreased strength, dizziness, hypomobility, increased edema, impaired perceived functional ability, impaired flexibility, impaired sensation, improper body mechanics, postural dysfunction, obesity, and pain.    ACTIVITY LIMITATIONS carrying, lifting, standing, squatting, stairs, transfers, bed mobility, continence, bathing, dressing, reach over head, locomotion level, and caring for others   PARTICIPATION LIMITATIONS: meal prep, cleaning, laundry, interpersonal relationship, shopping, community activity, yard work, and   anything that requires walking, standing, and/or upright balance including shopping, carrying, grocery shopping, navigating stairs, social outings, housework, yardwork, bathing and dressing, gardening, going to the movies.    PERSONAL FACTORS Age, Fitness, Time since onset of injury/illness/exacerbation, and 3+ comorbidities:   anxiety, arthritis, CAD with stents placed  (monitored 1x a year by GP), depression, diastolic dysfunction, diverticulosis, hereditary lymphedema of B legs, history of angioedema, hyperlipidemia, HTN, lumbar spinal stenosis, s/P TAVR (05/11/2020), T2DM, Tubular adenoma of colon, varicose veins of both legs with edema, bladder stimulator (after bladder damaged from attempts to break up kidney stones per pt), bilateral cataract surgery, B knee replacements (R 2018 with manipulation, L 2014), lumbar laminectomy/decompression/microdiscectomy at L3-5 (02/06/2022), urinary incontinence, former smoker are also affecting patient's functional outcome.    REHAB POTENTIAL: Good   CLINICAL DECISION MAKING: Evolving/moderate complexity   EVALUATION COMPLEXITY: Moderate     GOALS: Goals reviewed with patient? No   SHORT TERM GOALS: Target date: 04/25/2022   Patient will be independent with initial home exercise program for self-management of symptoms. Baseline: Initial HEP to be provided at visit 2 as appropriate (04/11/22); initial HEP provided at visit 2 (04/13/2022);  Goal status: Met     LONG TERM GOALS: Target date: 07/04/2022. Updated to 09/05/2021 on 06/13/2022.    Patient will be independent with a long-term home exercise program for self-management of symptoms.  Baseline: Initial HEP to be provided at visit 2 as appropriate (04/11/22); initial HEP provided at visit 2 (04/13/2022); continuing to participate (06/13/2022);  Goal status: In-progress   2.  Patient will demonstrate improved FOTO by equal or greater than 10 points to demonstrate improvement in overall condition and self-reported functional ability.  Baseline: to be measured visit 2 as appropriate (04/11/22); 40 at visit #2 (04/13/2022); 54 at visit #10 (06/13/2022);  Goal status: In-progress   3.  Patient will ambulate equal or greater than 1000 feet on 6 Minute Walk Test with no AD to demonstrate improved ability to safely complete household and community ambulation without falls.   Baseline: to be tested visit 2 as appropriate (04/11/22); 710 feet with RW and mildly stooped posture, flexed knees. Reports her B LE felt less numb during last part of test. Reported B UE fatigue (04/13/2022);  912 feet while carrying SPC (not using it). Mildly stooped posture and flexed knees. Limited by shortness of breath (06/13/2022);  Goal status: In-progress   4.  Patient will demonstrate the ability to stand in tandem stance for equal or greater than 30 seconds on each side to demonstrate improved static balance for tasks such as cooking and gardening.  Baseline:  R = 16 seconds, L = 5 seconds  (04/11/22);R = 11 seconds, L = 4 seconds (increased sway and shakiness in B LE bilaterally, 06/13/2022);  Goal status: In-progress   5.  Patient will complete community, work and/or recreational activities without limitation due to current condition.  Baseline: difficulty with anything that requires walking, standing, and/or upright balance including shopping, carrying, grocery shopping, navigating stairs, social outings, housework, yardwork, bathing and dressing, walking on uneven ground (04/11/22); rates her improvement 50% and does not feel like she is falling all the time (06/13/2022);  Goal status: In-progress       PLAN: PT FREQUENCY: 1-2x/week   PT DURATION: 12 weeks   PLANNED INTERVENTIONS: Therapeutic exercises, Therapeutic activity, Neuromuscular re-education, Balance training, Gait training, Patient/Family education, Joint mobilization, Stair training, DME instructions, Aquatic Therapy, Dry Needling, Electrical stimulation, Spinal mobilization, Cryotherapy, Moist heat, Manual therapy, and Re-evaluation.   PLAN FOR NEXT SESSION: update HEP as appropriate, progressive balance and functional/LE strengthening as appropriate.    Everlean Alstrom. Graylon Good, PT, DPT 07/18/22, 4:05 PM  [facility] [address] [place], [place] [id number] P: [phone] I F:  [phone]

## 2022-07-20 ENCOUNTER — Encounter: Payer: Medicare Other | Admitting: Physical Therapy

## 2022-07-24 ENCOUNTER — Encounter: Payer: Medicare Other | Admitting: Physical Therapy

## 2022-07-26 ENCOUNTER — Ambulatory Visit: Payer: Medicare Other | Attending: Neurosurgery | Admitting: Physical Therapy

## 2022-07-26 ENCOUNTER — Encounter: Payer: Self-pay | Admitting: Physical Therapy

## 2022-07-26 DIAGNOSIS — M25651 Stiffness of right hip, not elsewhere classified: Secondary | ICD-10-CM | POA: Insufficient documentation

## 2022-07-26 DIAGNOSIS — R209 Unspecified disturbances of skin sensation: Secondary | ICD-10-CM | POA: Insufficient documentation

## 2022-07-26 DIAGNOSIS — M6281 Muscle weakness (generalized): Secondary | ICD-10-CM | POA: Diagnosis present

## 2022-07-26 DIAGNOSIS — R293 Abnormal posture: Secondary | ICD-10-CM | POA: Insufficient documentation

## 2022-07-26 DIAGNOSIS — R262 Difficulty in walking, not elsewhere classified: Secondary | ICD-10-CM | POA: Insufficient documentation

## 2022-07-26 DIAGNOSIS — M25652 Stiffness of left hip, not elsewhere classified: Secondary | ICD-10-CM | POA: Insufficient documentation

## 2022-07-26 DIAGNOSIS — M5459 Other low back pain: Secondary | ICD-10-CM | POA: Diagnosis present

## 2022-07-26 DIAGNOSIS — R2681 Unsteadiness on feet: Secondary | ICD-10-CM | POA: Diagnosis present
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
## 2022-07-31 ENCOUNTER — Encounter: Payer: Self-pay | Admitting: Physical Therapy

## 2022-07-31 ENCOUNTER — Ambulatory Visit: Payer: Medicare Other | Admitting: Physical Therapy

## 2022-07-31 DIAGNOSIS — R293 Abnormal posture: Secondary | ICD-10-CM

## 2022-07-31 DIAGNOSIS — M5459 Other low back pain: Secondary | ICD-10-CM

## 2022-07-31 DIAGNOSIS — M25651 Stiffness of right hip, not elsewhere classified: Secondary | ICD-10-CM

## 2022-07-31 DIAGNOSIS — M6281 Muscle weakness (generalized): Secondary | ICD-10-CM

## 2022-07-31 DIAGNOSIS — R2681 Unsteadiness on feet: Secondary | ICD-10-CM

## 2022-07-31 DIAGNOSIS — M25652 Stiffness of left hip, not elsewhere classified: Secondary | ICD-10-CM

## 2022-07-31 DIAGNOSIS — R262 Difficulty in walking, not elsewhere classified: Secondary | ICD-10-CM

## 2022-07-31 DIAGNOSIS — R209 Unspecified disturbances of skin sensation: Secondary | ICD-10-CM

## 2022-07-31 NOTE — Therapy (Addendum)
OUTPATIENT PHYSICAL THERAPY TREATMENT NOTE   Patient Name: Brandy Oneill MRN: [id number] DOB:04-23-1951, 71 y.o., female Today's Date: 07/31/2022  PCP: Barbette Reichmann, MD REFERRING PROVIDER: Venetia Night, MD  END OF SESSION:   PT End of Session - 07/31/22 1303     Visit Number 17    Number of Visits 24    Date for PT Re-Evaluation 09/05/22    Authorization Type MEDICARE PART B reporting period from 06/13/2022    Progress Note Due on Visit 20    PT Start Time 1302    PT Stop Time 1340    PT Time Calculation (min) 38 min    Equipment Utilized During Treatment Gait belt    Activity Tolerance Patient tolerated treatment well;Patient limited by fatigue    Behavior During Therapy WFL for tasks assessed/performed               Past Medical History:  Diagnosis Date   Anxiety    Arthritis    CAD (coronary artery disease) 04/14/2020   a.) RHC 04/14/2020: mod CAD with sev obstructive Dz in large dom RCA; staged PCI planned. b.) PCI 04/28/2020: DES (unknown type) placed to dRCA.   Depression    Diastolic dysfunction 01/06/2019   a.) TTE 01/06/2019: EF >55%; triv panvalvular regurgitation; G1DD. b.) TTE 01/26/2020: EF > 55%; mod LVH; triv MR/TR/PR; G1DD.   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 b.) TTE 01/26/2020: EF >55%; sev AS (MPG 47.1 mmHg). c.) RHC 04/14/2020: norm CI, PVR, RAP; PCWP 10. d.) s/p TAVR 05/11/2020. e.) TTE 06/01/2020: EF >55%; mild LVH; MPG 9 mmHg. f.) TTE 06/16/2021: EF >55%; mild LVH; GLS -11.6%; MPG 24 mmHg.   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   a.) 23 mm Edwards Sapien S3 bioprosthetic valve   T2DM (type 2 diabetes mellitus) (HCC)    Tubular adenoma of colon    Varicose veins of both legs with edema    Past Surgical History:  Procedure Laterality Date   bladder stimulater  2014   CARDIAC VALVE REPLACEMENT     CATARACT EXTRACTION W/ INTRAOCULAR LENS IMPLANT Bilateral 2014   one eye done then the other eye done a month later   CESAREAN SECTION     COLONOSCOPY WITH PROPOFOL N/A 06/26/2018   Procedure: COLONOSCOPY WITH PROPOFOL;  Surgeon: Toledo, Boykin Nearing, MD;  Location: [facility];  Service: Gastroenterology;  Laterality: N/A;   EYE SURGERY Right 2018   macular hole   JOINT REPLACEMENT Right 10/27/2016   JOINT REPLACEMENT Left 2014   KNEE CLOSED REDUCTION Right 11/29/2016   Procedure: CLOSED MANIPULATION KNEE;  Surgeon: Erin Sons, MD;  Location: [facility];  Service: Orthopedics;  Laterality: Right;   LUMBAR LAMINECTOMY/DECOMPRESSION MICRODISCECTOMY N/A 02/06/2022   Procedure: L3-5 DECOMPRESSION;  Surgeon: Venetia Night, MD;  Location: [facility];  Service: Neurosurgery;  Laterality: N/A;   Patient Active Problem List   Diagnosis Date Noted   Lumbar stenosis 02/06/2022  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   CAD (coronary artery disease) 04/28/2020   S/P angioplasty with stent 04/28/2020   Nonrheumatic aortic valve stenosis 03/12/2020   Essential hypertension 02/06/2019   Hyperlipidemia 02/06/2019   Spinal stenosis of lumbar region with radiculopathy 09/27/2018   BMI 38.0-38.9,adult 09/26/2018   Back pain 09/05/2018   Diet-controlled type 2 diabetes mellitus (HCC) 03/27/2018   Postmenopausal 03/27/2018   DJD (degenerative joint disease) 02/04/2018   History of angioedema 09/17/2017   Hyperglycemia 09/17/2017   Lymphedema 02/04/2017   Chronic venous insufficiency 02/04/2017    Swelling of limb 02/04/2017   Pure hypercholesterolemia 10/02/2016   History of total knee arthroplasty 09/20/2016   Varicose veins of both legs with edema 03/25/2016   Abnormal mammogram 06/01/2015   Urinary incontinence in female 06/01/2015   Prediabetes 06/16/2014   Anxiety and depression 01/28/2014   Psoriasis 01/28/2014    REFERRING DIAG: spinal stenosis of lumbar region with neurogenic claudication, imbalance  THERAPY DIAG:  Other low back pain  Difficulty in walking, not elsewhere classified  Unsteadiness on feet  Unspecified disturbances of skin sensation  Stiffness of right hip, not elsewhere classified  Stiffness of left hip, not elsewhere classified  Abnormal posture  Muscle weakness (generalized)  Rationale for Evaluation and Treatment: Rehabilitation  PERTINENT HISTORY: Patient is a 71 y.o. female who presents to outpatient physical therapy with a referral for medical diagnosis spinal stenosis of lumbar region with neurogenic claudication, imbalance. This patient's chief complaints consist of numbness and weakness in her B lower legs following L3-5 lumbar decompression including central laminectomy and bilateral medial facetectomies including foraminotomies on  02/06/22 and difficulty with mobility and balance leading to the following functional deficits: difficulty with anything that requires walking, standing, and/or upright balance including shopping, carrying, grocery shopping, navigating stairs, social outings, housework, yardwork, bathing and dressing. Relevant past medical history and comorbidities include anxiety, arthritis, CAD with stents placed (monitored 1x a year by GP), depression, diastolic dysfunction, diverticulosis, hereditary lymphedema of B legs, history of angioedema, hyperlipidemia, HTN, lumbar spinal stenosis, s/P TAVR (05/11/2020), T2DM, Tubular adenoma of colon, varicose veins of both legs with edema, bladder stimulator (after bladder damaged from  attempts to break up kidney stones per pt), bilateral cataract surgery, B knee replacements (R 2018 with manipulation, L 2014), lumbar laminectomy/decompression/microdiscectomy at L3-5 (02/06/2022), urinary incontinence, former smoker.  Patient denies hx of cancer, stroke, seizures, lung problems, unexplained weight loss, and osteoporosis. She endorses worsening urinary incontinence since back surgery; has a history of bladder damage that made it hard for her to get to control her urine once she felt urgency; now, she does not feel urgency since having her back surgery. She has a bladder stimulator that is not working since right before [employer].   PRECAUTIONS: do not lift anything over 25#, fall.   SUBJECTIVE: Patient arrives with no AD. She states she is feeling pretty good. Her knees and quads were sore after last PT session. She states she still has numbness in her feet and ankles, but not as bad as before.   PAIN:  Are you having pain? Paresthesia in bilateral feet and ankles and bilateral lower legs.   OBJECTIVE   TODAY'S TREATMENT  Neuromuscular Re-education: to improve, balance, postural strength, muscle activation patterns, and stabilization strength required for functional activities: - ambulation over ground for speed  with no AD with 7.5# AW on each foot and holding 3# DB in each hand with cuing to swing arms to improve  gait pattern and balance. 2x300, feet with 1 min seated rest. CGA. Cuing for speed and taking larger, higher steps, arm swing.  - standing alternationg 365 degree turns with B UE touchdown on TM bar between turns, 1x10 each direction. Calling out second hand position between turns. SBA.    Therapeutic exercise: to centralize symptoms and improve ROM, strength, muscular endurance, and activity tolerance required for successful completion of functional activities.  - sit <> stand with ball drop/retreive from 17 inch chair. 3x12 with 3kg med ball. SBA. Patient  unsteady at times when bending forwards go get ball, but improving. Cuing to move towards edge of chair to keep from backs of knees from hitting chair.  - standing B heel raise with balls of feet on 2x4 board, B UE support, 3x15/20. 2x30 second gastroc stretch with forefeet on board between sets.  - forwards step up to 8 inch step, 2x10 each foot leading and ipsilateral UE support. Needed  min cuing for sequencing but had improved carry over. needed CGA for safety. No AW first set and 5# AW on each foot 2nd set. seated rest between sets.   - lateral step over 8 inch aerobic step, 1x10 each side with 5# AW, SBA-CGA. Cuing for larger steps.   Pt required multimodal cuing for proper technique and to facilitate improved neuromuscular control, strength, range of motion, and functional ability resulting in improved performance and form.     PATIENT EDUCATION:  Education details: Exercise purpose/form. Self management techniques. Education on HEP including handout  Person educated: Patient Education method: Explanation, demonstration, verbal cuing Education comprehension: verbalized and demonstrated understanding and needs further education     HOME EXERCISE PROGRAM: Access Code: [email or web address] URL: [email or web address] Date: 04/13/2022 Prepared by: Norton Blizzard  Exercises - Tandem Stance  - 1 x daily - 2 sets - 1 minute hold - Corner Balance Feet Together With Eyes Closed  - 1 x daily - 2 sets - 1 min hold - Corner Balance Feet Together: Eyes Open With Head Turns  - 1 x daily - 2 sets - 20 reps - Sit to Stand Without Arm Support  - 1 x daily - 3 sets - 10 reps   ASSESSMENT:   CLINICAL IMPRESSION: Patient arrives on time to PT today with report of some quad soreness and knee pain following last PT session. Session continued to focus on improving functional strength, activity tolerance, and balance. Patient was able to tolerate progressions in several exercises but continues to require  cuing from PT for improved form, exercise selection and volume, and guarding for safety. Patient continues to demonstrate periodic unsteadiness on feet especially with turning and walking backwards. Patient would benefit from continued management of limiting condition by skilled physical therapist to address remaining impairments and functional limitations to work towards stated goals and return to PLOF or maximal functional independence.   From PT eval 04/11/2022:  Patient is a 71 y.o. female referred to outpatient physical therapy with a medical diagnosis of spinal stenosis of lumbar region with neurogenic claudication and imbalance who presents with signs and symptoms consistent with unsteadiness on feet, loss of sensation, and difficulty with strength and motor control of B lower legs as well as hip and back stiffness with weakness in right dorsiflexion and great toe extension. She also reported increased urinary incontinence since surgery where she leaks without urge, so she has been wearing diapers. Lower leg symptoms appear similar to that expected from peripheral neuropathy. R ankle/foot weakness suggests possible  former or current compromise of L4 or L5 nerve root, but could also be related to neuropathy symptoms. Patient denies weakness or numbness in B lower legs prior to recent back surgery and and chart review lacks prior record of involvement of patient's lower legs, ankles, or feet.  Patient's symptoms were unable to be worsened or relieved with exam today and PT was unable to establish a direct link between lumbar spine and lower leg numbness/weakness. Patient is also suffering from significant stooped posture with lack of hip and back extension ROM contributing. This is likely related to having difficulty with maintaining standing postures for a prolonged period related to spinal stenosis and back pain in standing postures. Patient presents with significant pain, ROM, sensation, balance, motor  control, posture, joint stiffness, gait, muscle tension, muscle performance (strength/power/endurance) and activity tolerance impairments that are limiting ability to complete her usual activities including anything that requires walking, standing, and/or upright balance including shopping, carrying, grocery shopping, navigating stairs, social outings, housework, yardwork, bathing and dressing without difficulty. Patient will benefit from skilled physical therapy intervention to address current body structure impairments and activity limitations to improve function and work towards goals set in current POC in order to return to prior level of function or maximal functional improvement.      OBJECTIVE IMPAIRMENTS Abnormal gait, decreased activity tolerance, decreased balance, decreased coordination, decreased endurance, decreased knowledge of condition, decreased knowledge of use of DME, decreased mobility, difficulty walking, decreased ROM, decreased strength, dizziness, hypomobility, increased edema, impaired perceived functional ability, impaired flexibility, impaired sensation, improper body mechanics, postural dysfunction, obesity, and pain.    ACTIVITY LIMITATIONS carrying, lifting, standing, squatting, stairs, transfers, bed mobility, continence, bathing, dressing, reach over head, locomotion level, and caring for others   PARTICIPATION LIMITATIONS: meal prep, cleaning, laundry, interpersonal relationship, shopping, community activity, yard work, and   anything that requires walking, standing, and/or upright balance including shopping, carrying, grocery shopping, navigating stairs, social outings, housework, yardwork, bathing and dressing, gardening, going to the movies.    PERSONAL FACTORS Age, Fitness, Time since onset of injury/illness/exacerbation, and 3+ comorbidities:   anxiety, arthritis, CAD with stents placed (monitored 1x a year by GP), depression, diastolic dysfunction, diverticulosis,  hereditary lymphedema of B legs, history of angioedema, hyperlipidemia, HTN, lumbar spinal stenosis, s/P TAVR (05/11/2020), T2DM, Tubular adenoma of colon, varicose veins of both legs with edema, bladder stimulator (after bladder damaged from attempts to break up kidney stones per pt), bilateral cataract surgery, B knee replacements (R 2018 with manipulation, L 2014), lumbar laminectomy/decompression/microdiscectomy at L3-5 (02/06/2022), urinary incontinence, former smoker are also affecting patient's functional outcome.    REHAB POTENTIAL: Good   CLINICAL DECISION MAKING: Evolving/moderate complexity   EVALUATION COMPLEXITY: Moderate     GOALS: Goals reviewed with patient? No   SHORT TERM GOALS: Target date: 04/25/2022   Patient will be independent with initial home exercise program for self-management of symptoms. Baseline: Initial HEP to be provided at visit 2 as appropriate (04/11/22); initial HEP provided at visit 2 (04/13/2022);  Goal status: Met     LONG TERM GOALS: Target date: 07/04/2022. Updated to 09/05/2021 on 06/13/2022.    Patient will be independent with a long-term home exercise program for self-management of symptoms.  Baseline: Initial HEP to be provided at visit 2 as appropriate (04/11/22); initial HEP provided at visit 2 (04/13/2022); continuing to participate (06/13/2022);  Goal status: In-progress   2.  Patient will demonstrate improved FOTO by equal or greater than 10 points to demonstrate improvement  in overall condition and self-reported functional ability.  Baseline: to be measured visit 2 as appropriate (04/11/22); 40 at visit #2 (04/13/2022); 54 at visit #10 (06/13/2022);  Goal status: In-progress   3.  Patient will ambulate equal or greater than 1000 feet on 6 Minute Walk Test with no AD to demonstrate improved ability to safely complete household and community ambulation without falls.  Baseline: to be tested visit 2 as appropriate (04/11/22); 710 feet with RW and  mildly stooped posture, flexed knees. Reports her B LE felt less numb during last part of test. Reported B UE fatigue (04/13/2022);  912 feet while carrying SPC (not using it). Mildly stooped posture and flexed knees. Limited by shortness of breath (06/13/2022);  Goal status: In-progress   4.  Patient will demonstrate the ability to stand in tandem stance for equal or greater than 30 seconds on each side to demonstrate improved static balance for tasks such as cooking and gardening.  Baseline:  R = 16 seconds, L = 5 seconds  (04/11/22);R = 11 seconds, L = 4 seconds (increased sway and shakiness in B LE bilaterally, 06/13/2022);  Goal status: In-progress   5.  Patient will complete community, work and/or recreational activities without limitation due to current condition.  Baseline: difficulty with anything that requires walking, standing, and/or upright balance including shopping, carrying, grocery shopping, navigating stairs, social outings, housework, yardwork, bathing and dressing, walking on uneven ground (04/11/22); rates her improvement 50% and does not feel like she is falling all the time (06/13/2022);  Goal status: In-progress       PLAN: PT FREQUENCY: 1-2x/week   PT DURATION: 12 weeks   PLANNED INTERVENTIONS: Therapeutic exercises, Therapeutic activity, Neuromuscular re-education, Balance training, Gait training, Patient/Family education, Joint mobilization, Stair training, DME instructions, Aquatic Therapy, Dry Needling, Electrical stimulation, Spinal mobilization, Cryotherapy, Moist heat, Manual therapy, and Re-evaluation.   PLAN FOR NEXT SESSION: update HEP as appropriate, progressive balance and functional/LE strengthening as appropriate.    Luretha Murphy. Ilsa Iha, PT, DPT 07/31/22, 1:44 PM  [facility] [address] [place], [place] [address] P: [phone] I F: [phone]  addendum to correct visit number.  Luretha Murphy. Ilsa Iha, PT, DPT 08/01/22,  3:42 PM

## 2022-08-03 ENCOUNTER — Ambulatory Visit: Payer: Medicare Other | Admitting: Physical Therapy

## 2022-08-08 ENCOUNTER — Ambulatory Visit: Payer: Medicare Other | Admitting: Physical Therapy

## 2022-08-08 ENCOUNTER — Encounter: Payer: Self-pay | Admitting: Physical Therapy

## 2022-08-08 DIAGNOSIS — R293 Abnormal posture: Secondary | ICD-10-CM

## 2022-08-08 DIAGNOSIS — M5459 Other low back pain: Secondary | ICD-10-CM | POA: Diagnosis not present

## 2022-08-08 DIAGNOSIS — M6281 Muscle weakness (generalized): Secondary | ICD-10-CM

## 2022-08-08 DIAGNOSIS — R209 Unspecified disturbances of skin sensation: Secondary | ICD-10-CM

## 2022-08-08 DIAGNOSIS — R262 Difficulty in walking, not elsewhere classified: Secondary | ICD-10-CM

## 2022-08-08 DIAGNOSIS — R2681 Unsteadiness on feet: Secondary | ICD-10-CM

## 2022-08-08 DIAGNOSIS — M25652 Stiffness of left hip, not elsewhere classified: Secondary | ICD-10-CM

## 2022-08-08 DIAGNOSIS — M25651 Stiffness of right hip, not elsewhere classified: Secondary | ICD-10-CM

## 2022-08-08 NOTE — Therapy (Signed)
OUTPATIENT PHYSICAL THERAPY TREATMENT NOTE   Patient Name: Brandy Oneill MRN: [id number] DOB:Oct 22, 1950, 71 y.o., female Today's Date: 08/10/2022  PCP: Tracie Harrier, MD REFERRING PROVIDER: Meade Maw, MD  END OF SESSION:   PT End of Session - 08/08/22 1520    Visit Number 18    Number of Visits 24    Date for PT Re-Evaluation 09/05/22    Authorization Type MEDICARE PART B reporting period from 06/13/2022    Progress Note Due on Visit 20    PT Start Time 1520    PT Stop Time 1600    PT Time Calculation (min) 40 min    Equipment Utilized During Treatment Gait belt    Activity Tolerance Patient tolerated treatment well;Patient limited by fatigue    Behavior During Therapy WFL for tasks assessed/performed             Past Medical History:  Diagnosis Date   Anxiety    Arthritis    CAD (coronary artery disease) 04/14/2020   a.) RHC 04/14/2020: mod CAD with sev obstructive Dz in large dom RCA; staged PCI planned. b.) PCI 04/28/2020: DES (unknown type) placed to dRCA.   Depression    Diastolic dysfunction 54/65/6812   a.) TTE 01/06/2019: EF >55%; triv panvalvular regurgitation; G1DD. b.) TTE 01/26/2020: EF > 55%; mod LVH; triv MR/TR/PR; G1DD.   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 b.) TTE 01/26/2020: EF >55%; sev AS (MPG 47.1 mmHg). c.) RHC 04/14/2020: norm CI, PVR, RAP; PCWP 10. d.) s/p TAVR 05/11/2020. e.) TTE 06/01/2020: EF >55%; mild LVH; MPG 9 mmHg. f.) TTE 06/16/2021: EF >55%; mild LVH; GLS -11.6%; MPG 24 mmHg.   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   a.) 23 mm Edwards Sapien S3 bioprosthetic valve   T2DM (type 2 diabetes mellitus) ([place])    Tubular adenoma of colon    Varicose veins of both legs with edema    Past Surgical History:  Procedure Laterality Date   bladder stimulater  2014   CARDIAC VALVE REPLACEMENT     CATARACT EXTRACTION W/ INTRAOCULAR LENS IMPLANT Bilateral 2014   one eye done then the other eye done a month later   [place] N/A 06/26/2018   Procedure: COLONOSCOPY WITH PROPOFOL;  Surgeon: Toledo, Benay Pike, MD;  Location: [facility];  Service: Gastroenterology;  Laterality: N/A;   EYE SURGERY Right 2018   macular hole   JOINT REPLACEMENT Right 10/27/2016   JOINT REPLACEMENT Left 2014   KNEE CLOSED REDUCTION Right 11/29/2016   Procedure: CLOSED MANIPULATION KNEE;  Surgeon: Leanor Kail, MD;  Location: [facility] ORS;  Service: Orthopedics;  Laterality: Right;   LUMBAR LAMINECTOMY/DECOMPRESSION MICRODISCECTOMY N/A 02/06/2022   Procedure: L3-5 DECOMPRESSION;  Surgeon: Meade Maw, MD;  Location: [facility] ORS;  Service: Neurosurgery;  Laterality: N/A;   Patient Active Problem List   Diagnosis Date Noted   Lumbar stenosis 02/06/2022   S/P TAVR (transcatheter  aortic valve replacement) 05/10/2020   CAD (coronary artery disease) 04/28/2020   S/P angioplasty with stent 04/28/2020   Nonrheumatic aortic valve stenosis 03/12/2020   Essential hypertension 02/06/2019   Hyperlipidemia 02/06/2019   Spinal stenosis of lumbar region with radiculopathy 09/27/2018   BMI 38.0-38.9,adult 09/26/2018   Back pain 09/05/2018   Diet-controlled type 2 diabetes mellitus ([place]) 03/27/2018   Postmenopausal 03/27/2018   DJD (degenerative joint disease) 02/04/2018   History of angioedema 09/17/2017   Hyperglycemia 09/17/2017   Lymphedema 02/04/2017   Chronic venous insufficiency 02/04/2017    Swelling of limb 02/04/2017   Pure hypercholesterolemia 10/02/2016   History of total knee arthroplasty 09/20/2016   Varicose veins of both legs with edema 03/25/2016   Abnormal mammogram 06/01/2015   Urinary incontinence in female 06/01/2015   Prediabetes 06/16/2014   Anxiety and depression 01/28/2014   Psoriasis 01/28/2014    REFERRING DIAG: spinal stenosis of lumbar region with neurogenic claudication, imbalance  THERAPY DIAG:  Other low back pain  Difficulty in walking, not elsewhere classified  Unsteadiness on feet  Unspecified disturbances of skin sensation  Stiffness of right hip, not elsewhere classified  Stiffness of left hip, not elsewhere classified  Abnormal posture  Muscle weakness (generalized)  Rationale for Evaluation and Treatment: Rehabilitation  PERTINENT HISTORY: Patient is a 71 y.o. female who presents to outpatient physical therapy with a referral for medical diagnosis spinal stenosis of lumbar region with neurogenic claudication, imbalance. This patient's chief complaints consist of numbness and weakness in her B lower legs following L3-5 lumbar decompression including central laminectomy and bilateral medial facetectomies including foraminotomies on  02/06/22 and difficulty with mobility and balance leading to the following functional deficits: difficulty with anything that requires walking, standing, and/or upright balance including shopping, carrying, grocery shopping, navigating stairs, social outings, housework, yardwork, bathing and dressing. Relevant past medical history and comorbidities include anxiety, arthritis, CAD with stents placed (monitored 1x a year by GP), depression, diastolic dysfunction, diverticulosis, hereditary lymphedema of B legs, history of angioedema, hyperlipidemia, HTN, lumbar spinal stenosis, s/P TAVR (05/11/2020), T2DM, Tubular adenoma of colon, varicose veins of both legs with edema, bladder stimulator (after bladder damaged from  attempts to break up kidney stones per pt), bilateral cataract surgery, B knee replacements (R 2018 with manipulation, L 2014), lumbar laminectomy/decompression/microdiscectomy at L3-5 (02/06/2022), urinary incontinence, former smoker.  Patient denies hx of cancer, stroke, seizures, lung problems, unexplained weight loss, and osteoporosis. She endorses worsening urinary incontinence since back surgery; has a history of bladder damage that made it hard for her to get to control her urine once she felt urgency; now, she does not feel urgency since having her back surgery. She has a bladder stimulator that is not working since right before [employer].   PRECAUTIONS: do not lift anything over 25#, fall.   SUBJECTIVE: Patient arrives with no AD. She states she is making progress and was able to put her own trash out down at the end of her long driveway. She was so sick last week with a head cold. She states she took a test and no covid19 and she states she started feeling bad last Monday.   PAIN:  Are you having pain? Paresthesia in bilateral feet and up sides of ankles.   OBJECTIVE   TODAY'S TREATMENT  Neuromuscular Re-education: to improve, balance, postural strength, muscle activation patterns, and stabilization strength required for functional activities: - ambulation over ground for speed  with no AD with 7.5# AW on each foot and  holding 3# DB in each hand with cuing to swing arms to improve gait pattern and balance. 2x300, feet with 1 min seated rest. CGA. Cuing for speed and taking larger, higher steps, arm swing.  - standing alternationg 365 degree turns with B UE touchdown on TM bar between turns, 1x10 each direction. Calling out second hand position between turns. SBA.  - walking with 180 degree turns when called out by PT at random times progressing to with shoulder tap to cue which direction to turn,  CGA. 1x2 min of walking.     Therapeutic exercise: to centralize symptoms and improve  ROM, strength, muscular endurance, and activity tolerance required for successful completion of functional activities.  - standing B heel raise with balls of feet on 2x4 board, B UE support, 3x15/20. 2x30 second gastroc stretch with forefeet on board between sets.  - sit <> stand with ball drop/retreive from 17 inch chair. 3x12 with 3kg med ball. SBA. Patient mostly steady.  - forwards step up to 8 inch step, 1x10 each foot leading and ipsilateral UE support. Needed  min cuing for sequencing but had improved carry over. needed CGA for safety. 5# AW on each foot. seated rest between sets.  - lateral step over 8 inch aerobic step, 1x10 each side with 5# AW, SBA. B UE support.   Pt required multimodal cuing for proper technique and to facilitate improved neuromuscular control, strength, range of motion, and functional ability resulting in improved performance and form.     PATIENT EDUCATION:  Education details: Exercise purpose/form. Self management techniques. Education on HEP including handout  Person educated: Patient Education method: Explanation, demonstration, verbal cuing Education comprehension: verbalized and demonstrated understanding and needs further education     HOME EXERCISE PROGRAM: Access Code: [email or web address] URL: [email or web address] Date: 04/13/2022 Prepared by: Rosita Kea  Exercises - Tandem Stance  - 1 x daily - 2 sets - 1 minute hold - Corner Balance Feet Together With Eyes Closed  - 1 x daily - 2 sets - 1 min hold - Corner Balance Feet Together: Eyes Open With Head Turns  - 1 x daily - 2 sets - 20 reps - Sit to Stand Without Arm Support  - 1 x daily - 3 sets - 10 reps   ASSESSMENT:   CLINICAL IMPRESSION: Patient arrives continuing to feel better and reporting improving function. Continued with balance and functional strengthening. Patient appears to be nearing readiness for discharge and will assess for that next session. Patient would benefit from  continued management of limiting condition by skilled physical therapist to address remaining impairments and functional limitations to work towards stated goals and return to PLOF or maximal functional independence.   From PT eval 04/11/2022:  Patient is a 71 y.o. female referred to outpatient physical therapy with a medical diagnosis of spinal stenosis of lumbar region with neurogenic claudication and imbalance who presents with signs and symptoms consistent with unsteadiness on feet, loss of sensation, and difficulty with strength and motor control of B lower legs as well as hip and back stiffness with weakness in right dorsiflexion and great toe extension. She also reported increased urinary incontinence since surgery where she leaks without urge, so she has been wearing diapers. Lower leg symptoms appear similar to that expected from peripheral neuropathy. R ankle/foot weakness suggests possible former or current compromise of L4 or L5 nerve root, but could also be related to neuropathy symptoms. Patient denies weakness or numbness in B lower legs prior to recent  back surgery and and chart review lacks prior record of involvement of patient's lower legs, ankles, or feet.  Patient's symptoms were unable to be worsened or relieved with exam today and PT was unable to establish a direct link between lumbar spine and lower leg numbness/weakness. Patient is also suffering from significant stooped posture with lack of hip and back extension ROM contributing. This is likely related to having difficulty with maintaining standing postures for a prolonged period related to spinal stenosis and back pain in standing postures. Patient presents with significant pain, ROM, sensation, balance, motor control, posture, joint stiffness, gait, muscle tension, muscle performance (strength/power/endurance) and activity tolerance impairments that are limiting ability to complete her usual activities including anything that requires  walking, standing, and/or upright balance including shopping, carrying, grocery shopping, navigating stairs, social outings, housework, yardwork, bathing and dressing without difficulty. Patient will benefit from skilled physical therapy intervention to address current body structure impairments and activity limitations to improve function and work towards goals set in current POC in order to return to prior level of function or maximal functional improvement.      OBJECTIVE IMPAIRMENTS Abnormal gait, decreased activity tolerance, decreased balance, decreased coordination, decreased endurance, decreased knowledge of condition, decreased knowledge of use of DME, decreased mobility, difficulty walking, decreased ROM, decreased strength, dizziness, hypomobility, increased edema, impaired perceived functional ability, impaired flexibility, impaired sensation, improper body mechanics, postural dysfunction, obesity, and pain.    ACTIVITY LIMITATIONS carrying, lifting, standing, squatting, stairs, transfers, bed mobility, continence, bathing, dressing, reach over head, locomotion level, and caring for others   PARTICIPATION LIMITATIONS: meal prep, cleaning, laundry, interpersonal relationship, shopping, community activity, yard work, and   anything that requires walking, standing, and/or upright balance including shopping, carrying, grocery shopping, navigating stairs, social outings, housework, yardwork, bathing and dressing, gardening, going to the movies.    PERSONAL FACTORS Age, Fitness, Time since onset of injury/illness/exacerbation, and 3+ comorbidities:   anxiety, arthritis, CAD with stents placed (monitored 1x a year by GP), depression, diastolic dysfunction, diverticulosis, hereditary lymphedema of B legs, history of angioedema, hyperlipidemia, HTN, lumbar spinal stenosis, s/P TAVR (05/11/2020), T2DM, Tubular adenoma of colon, varicose veins of both legs with edema, bladder stimulator (after bladder  damaged from attempts to break up kidney stones per pt), bilateral cataract surgery, B knee replacements (R 2018 with manipulation, L 2014), lumbar laminectomy/decompression/microdiscectomy at L3-5 (02/06/2022), urinary incontinence, former smoker are also affecting patient's functional outcome.    REHAB POTENTIAL: Good   CLINICAL DECISION MAKING: Evolving/moderate complexity   EVALUATION COMPLEXITY: Moderate     GOALS: Goals reviewed with patient? No   SHORT TERM GOALS: Target date: 04/25/2022   Patient will be independent with initial home exercise program for self-management of symptoms. Baseline: Initial HEP to be provided at visit 2 as appropriate (04/11/22); initial HEP provided at visit 2 (04/13/2022);  Goal status: Met     LONG TERM GOALS: Target date: 07/04/2022. Updated to 09/05/2021 on 06/13/2022.    Patient will be independent with a long-term home exercise program for self-management of symptoms.  Baseline: Initial HEP to be provided at visit 2 as appropriate (04/11/22); initial HEP provided at visit 2 (04/13/2022); continuing to participate (06/13/2022);  Goal status: In-progress   2.  Patient will demonstrate improved FOTO by equal or greater than 10 points to demonstrate improvement in overall condition and self-reported functional ability.  Baseline: to be measured visit 2 as appropriate (04/11/22); 40 at visit #2 (04/13/2022); 54 at visit #10 (06/13/2022);  Goal status:  In-progress   3.  Patient will ambulate equal or greater than 1000 feet on 6 Minute Walk Test with no AD to demonstrate improved ability to safely complete household and community ambulation without falls.  Baseline: to be tested visit 2 as appropriate (04/11/22); 710 feet with RW and mildly stooped posture, flexed knees. Reports her B LE felt less numb during last part of test. Reported B UE fatigue (04/13/2022);  912 feet while carrying SPC (not using it). Mildly stooped posture and flexed knees. Limited by  shortness of breath (06/13/2022);  Goal status: In-progress   4.  Patient will demonstrate the ability to stand in tandem stance for equal or greater than 30 seconds on each side to demonstrate improved static balance for tasks such as cooking and gardening.  Baseline:  R = 16 seconds, L = 5 seconds  (04/11/22);R = 11 seconds, L = 4 seconds (increased sway and shakiness in B LE bilaterally, 06/13/2022);  Goal status: In-progress   5.  Patient will complete community, work and/or recreational activities without limitation due to current condition.  Baseline: difficulty with anything that requires walking, standing, and/or upright balance including shopping, carrying, grocery shopping, navigating stairs, social outings, housework, yardwork, bathing and dressing, walking on uneven ground (04/11/22); rates her improvement 50% and does not feel like she is falling all the time (06/13/2022);  Goal status: In-progress       PLAN: PT FREQUENCY: 1-2x/week   PT DURATION: 12 weeks   PLANNED INTERVENTIONS: Therapeutic exercises, Therapeutic activity, Neuromuscular re-education, Balance training, Gait training, Patient/Family education, Joint mobilization, Stair training, DME instructions, Aquatic Therapy, Dry Needling, Electrical stimulation, Spinal mobilization, Cryotherapy, Moist heat, Manual therapy, and Re-evaluation.   PLAN FOR NEXT SESSION: update HEP as appropriate, progressive balance and functional/LE strengthening as appropriate.    Everlean Alstrom. Graylon Good, PT, DPT 08/10/22, 10:47 AM  [facility] [address] [place], [place] [address] P: [phone] I F: [phone]  addendum to correct visit number.  Everlean Alstrom. Graylon Good, PT, DPT 08/10/22, 10:47 AM

## 2022-08-10 ENCOUNTER — Ambulatory Visit: Payer: Medicare Other | Admitting: Physical Therapy

## 2022-08-10 ENCOUNTER — Encounter: Payer: Self-pay | Admitting: Physical Therapy

## 2022-08-10 DIAGNOSIS — R262 Difficulty in walking, not elsewhere classified: Secondary | ICD-10-CM

## 2022-08-10 DIAGNOSIS — M5459 Other low back pain: Secondary | ICD-10-CM

## 2022-08-10 DIAGNOSIS — M25652 Stiffness of left hip, not elsewhere classified: Secondary | ICD-10-CM

## 2022-08-10 DIAGNOSIS — R2681 Unsteadiness on feet: Secondary | ICD-10-CM

## 2022-08-10 DIAGNOSIS — R209 Unspecified disturbances of skin sensation: Secondary | ICD-10-CM

## 2022-08-10 DIAGNOSIS — M25651 Stiffness of right hip, not elsewhere classified: Secondary | ICD-10-CM

## 2022-08-10 DIAGNOSIS — R293 Abnormal posture: Secondary | ICD-10-CM

## 2022-08-10 NOTE — Therapy (Signed)
OUTPATIENT PHYSICAL THERAPY DISCHARGE SUMMARY Dates of reporting from 04/11/2022 to 08/10/2022   Patient Name: Brandy Oneill MRN: [id number] DOB:1951/06/19, 71 y.o., female Today's Date: 08/10/2022  PCP: Tracie Harrier, MD REFERRING PROVIDER: Meade Maw, MD  END OF SESSION:   PT End of Session - 08/10/22 1048     Visit Number 0    Number of Visits 24    Date for PT Re-Evaluation 09/05/22    Authorization Type MEDICARE PART B reporting period from 06/13/2022    Progress Note Due on Visit 4    PT Start Time 1030    PT Stop Time 1040    PT Time Calculation (min) 10 min    Equipment Utilized During Treatment Gait belt    Activity Tolerance Patient tolerated treatment well;Patient limited by fatigue    Behavior During Therapy WFL for tasks assessed/performed               Past Medical History:  Diagnosis Date   Anxiety    Arthritis    CAD (coronary artery disease) 04/14/2020   a.) RHC 04/14/2020: mod CAD with sev obstructive Dz in large dom RCA; staged PCI planned. b.) PCI 04/28/2020: DES (unknown type) placed to dRCA.   Depression    Diastolic dysfunction 49/70/2637   a.) TTE 01/06/2019: EF >55%; triv panvalvular regurgitation; G1DD. b.) TTE 01/26/2020: EF > 55%; mod LVH; triv MR/TR/PR; G1DD.   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 b.) TTE 01/26/2020: EF >55%; sev AS (MPG 47.1 mmHg). c.) RHC 04/14/2020: norm CI, PVR, RAP; PCWP 10. d.) s/p TAVR 05/11/2020. e.) TTE 06/01/2020: EF >55%; mild LVH; MPG 9 mmHg. f.) TTE 06/16/2021: EF >55%; mild LVH; GLS -11.6%; MPG 24 mmHg.   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   a.) 23 mm Edwards Sapien S3 bioprosthetic valve   T2DM (type 2 diabetes mellitus) ([place])    Tubular adenoma of colon    Varicose veins of both legs with edema    Past Surgical History:  Procedure Laterality Date   bladder stimulater  2014   CARDIAC VALVE REPLACEMENT     CATARACT EXTRACTION W/ INTRAOCULAR LENS IMPLANT Bilateral 2014   one eye done then the other eye done a month later   [place] N/A 06/26/2018   Procedure: COLONOSCOPY WITH PROPOFOL;  Surgeon: Toledo, Benay Pike, MD;  Location: [facility];  Service: Gastroenterology;  Laterality: N/A;   EYE SURGERY Right 2018   macular hole   JOINT REPLACEMENT Right 10/27/2016   JOINT REPLACEMENT Left 2014   KNEE CLOSED REDUCTION Right 11/29/2016   Procedure: CLOSED MANIPULATION KNEE;  Surgeon: Leanor Kail, MD;  Location: [facility];  Service: Orthopedics;  Laterality: Right;   LUMBAR LAMINECTOMY/DECOMPRESSION MICRODISCECTOMY N/A 02/06/2022   Procedure: L3-5 DECOMPRESSION;  Surgeon: Meade Maw, MD;  Location: [facility] OR[facility];  Service: Neurosurgery;  Laterality: N/A;   Patient Active Problem List   Diagnosis Date Noted  Lumbar stenosis 02/06/2022   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   CAD (coronary artery disease) 04/28/2020   S/P angioplasty with stent 04/28/2020   Nonrheumatic aortic valve stenosis 03/12/2020   Essential hypertension 02/06/2019   Hyperlipidemia 02/06/2019   Spinal stenosis of lumbar region with radiculopathy 09/27/2018   BMI 38.0-38.9,adult 09/26/2018   Back pain 09/05/2018   Diet-controlled type 2 diabetes mellitus ([place]) 03/27/2018   Postmenopausal 03/27/2018   DJD (degenerative joint disease) 02/04/2018   History of angioedema 09/17/2017   Hyperglycemia 09/17/2017   Lymphedema  02/04/2017   Chronic venous insufficiency 02/04/2017   Swelling of limb 02/04/2017   Pure hypercholesterolemia 10/02/2016   History of total knee arthroplasty 09/20/2016   Varicose veins of both legs with edema 03/25/2016   Abnormal mammogram 06/01/2015   Urinary incontinence in female 06/01/2015   Prediabetes 06/16/2014   Anxiety and depression 01/28/2014   Psoriasis 01/28/2014    REFERRING DIAG: spinal stenosis of lumbar region with neurogenic claudication, imbalance  THERAPY DIAG:  Other low back pain  Difficulty in walking, not elsewhere classified  Unsteadiness on feet  Unspecified disturbances of skin sensation  Stiffness of right hip, not elsewhere classified  Stiffness of left hip, not elsewhere classified  Abnormal posture  Rationale for Evaluation and Treatment: Rehabilitation  PERTINENT HISTORY: Patient is a 71 y.o. female who presents to outpatient physical therapy with a referral for medical diagnosis spinal stenosis of lumbar region with neurogenic claudication, imbalance. This patient's chief complaints consist of numbness and weakness in her B lower legs following L3-5 lumbar decompression including central laminectomy and bilateral medial facetectomies including foraminotomies on  02/06/22 and difficulty with mobility and balance leading to the following functional deficits: difficulty with anything that requires walking, standing, and/or upright balance including shopping, carrying, grocery shopping, navigating stairs, social outings, housework, yardwork, bathing and dressing. Relevant past medical history and comorbidities include anxiety, arthritis, CAD with stents placed (monitored 1x a year by GP), depression, diastolic dysfunction, diverticulosis, hereditary lymphedema of B legs, history of angioedema, hyperlipidemia, HTN, lumbar spinal stenosis, s/P TAVR (05/11/2020), T2DM, Tubular adenoma of colon, varicose veins of both legs with edema, bladder stimulator  (after bladder damaged from attempts to break up kidney stones per pt), bilateral cataract surgery, B knee replacements (R 2018 with manipulation, L 2014), lumbar laminectomy/decompression/microdiscectomy at L3-5 (02/06/2022), urinary incontinence, former smoker.  Patient denies hx of cancer, stroke, seizures, lung problems, unexplained weight loss, and osteoporosis. She endorses worsening urinary incontinence since back surgery; has a history of bladder damage that made it hard for her to get to control her urine once she felt urgency; now, she does not feel urgency since having her back surgery. She has a bladder stimulator that is not working since right before [employer].   PRECAUTIONS: do not lift anything over 25#, fall.   SUBJECTIVE: Patient arrives with no AD. She states she is having an allergy attack and is unable to exercise today. She feels comfortable with discharge today and comfortable with her HEP.   PAIN:  Are you having pain? Paresthesia in bilateral feet and up sides of ankles.   OBJECTIVE  SELF-REPORTED FUNCTION FOTO score: 57/100 (balance questionnaire)  Patient declined physical activity today due to allergy attack, so no exercise based objective measures taken.   TODAY'S TREATMENT  Reviewed goals, educated with discharge recommendations, reviewed HEP.   Patient did not complete any billable treatment today due to being intolerant to exercise while she is having an allergy attack.  PATIENT EDUCATION:  Education details: Reviewed goals, educated with discharge recommendations, reviewed HEP.  Person educated: Patient Education method: Explanation Education comprehension: verbalized understanding     HOME EXERCISE PROGRAM: Access Code: [email or web address] URL: [email or web address] Date: 04/13/2022 Prepared by: Rosita Kea  Exercises - Tandem Stance  - 1 x daily - 2 sets - 1 minute hold - Corner Balance Feet Together With Eyes Closed  - 1 x daily -  2 sets - 1 min hold - Corner Balance Feet Together: Eyes Open With Head Turns  - 1 x daily - 2 sets - 20 reps - Sit to Stand Without Arm Support  - 1 x daily - 3 sets - 10 reps   ASSESSMENT:   CLINICAL IMPRESSION: Patient has attended 17 physical therapy sessions since starting current episode of care on 04/11/2022. Over the course of treatment patient has improved her balance and functional strength significantly. She has met or nearly met all of her goals except static standing in tandem stand which did not improve significantly. She reports decreased sensation of numbness and improved function in her daily life. Patient is now discharged from PT due to improvement in condition.     OBJECTIVE IMPAIRMENTS Abnormal gait, decreased activity tolerance, decreased balance, decreased coordination, decreased endurance, decreased knowledge of condition, decreased knowledge of use of DME, decreased mobility, difficulty walking, decreased ROM, decreased strength, dizziness, hypomobility, increased edema, impaired perceived functional ability, impaired flexibility, impaired sensation, improper body mechanics, postural dysfunction, obesity, and pain.    ACTIVITY LIMITATIONS carrying, lifting, standing, squatting, stairs, transfers, bed mobility, continence, bathing, dressing, reach over head, locomotion level, and caring for others   PARTICIPATION LIMITATIONS: meal prep, cleaning, laundry, interpersonal relationship, shopping, community activity, yard work, and   anything that requires walking, standing, and/or upright balance including shopping, carrying, grocery shopping, navigating stairs, social outings, housework, yardwork, bathing and dressing, gardening, going to the movies.    PERSONAL FACTORS Age, Fitness, Time since onset of injury/illness/exacerbation, and 3+ comorbidities:   anxiety, arthritis, CAD with stents placed (monitored 1x a year by GP), depression, diastolic dysfunction, diverticulosis,  hereditary lymphedema of B legs, history of angioedema, hyperlipidemia, HTN, lumbar spinal stenosis, s/P TAVR (05/11/2020), T2DM, Tubular adenoma of colon, varicose veins of both legs with edema, bladder stimulator (after bladder damaged from attempts to break up kidney stones per pt), bilateral cataract surgery, B knee replacements (R 2018 with manipulation, L 2014), lumbar laminectomy/decompression/microdiscectomy at L3-5 (02/06/2022), urinary incontinence, former smoker are also affecting patient's functional outcome.    REHAB POTENTIAL: Good   CLINICAL DECISION MAKING: Evolving/moderate complexity   EVALUATION COMPLEXITY: Moderate     GOALS: Goals reviewed with patient? No   SHORT TERM GOALS: Target date: 04/25/2022   Patient will be independent with initial home exercise program for self-management of symptoms. Baseline: Initial HEP to be provided at visit 2 as appropriate (04/11/22); initial HEP provided at visit 2 (04/13/2022);  Goal status: Met     LONG TERM GOALS: Target date: 07/04/2022. Updated to 09/05/2021 on 06/13/2022.    Patient will be independent with a long-term home exercise program for self-management of symptoms.  Baseline: Initial HEP to be provided at visit 2 as appropriate (04/11/22); initial HEP provided at visit 2 (04/13/2022); continuing to participate (06/13/2022); met (08/10/2022) Goal status: MET  2.  Patient will demonstrate improved FOTO by equal or greater than 10 points to demonstrate improvement in overall condition and self-reported functional ability.  Baseline: to be measured visit 2 as appropriate (04/11/22); 40 at  visit #2 (04/13/2022); 54 at visit #10 (06/13/2022); 57 at visit #17 (08/10/2022);  Goal status: MET   3.  Patient will ambulate equal or greater than 1000 feet on 6 Minute Walk Test with no AD to demonstrate improved ability to safely complete household and community ambulation without falls.  Baseline: to be tested visit 2 as appropriate  (04/11/22); 710 feet with RW and mildly stooped posture, flexed knees. Reports her B LE felt less numb during last part of test. Reported B UE fatigue (04/13/2022);  912 feet while carrying SPC (not using it). Mildly stooped posture and flexed knees. Limited by shortness of breath (06/13/2022); unable to test due to allergy attack (08/10/2022);  Goal status: [place] met.    4.  Patient will demonstrate the ability to stand in tandem stance for equal or greater than 30 seconds on each side to demonstrate improved static balance for tasks such as cooking and gardening.  Baseline:  R = 16 seconds, L = 5 seconds  (04/11/22);R = 11 seconds, L = 4 seconds (increased sway and shakiness in B LE bilaterally, 06/13/2022);  Goal status: Not met   5.  Patient will complete community, work and/or recreational activities without limitation due to current condition.  Baseline: difficulty with anything that requires walking, standing, and/or upright balance including shopping, carrying, grocery shopping, navigating stairs, social outings, housework, yardwork, bathing and dressing, walking on uneven ground (04/11/22); rates her improvement 50% and does not feel like she is falling all the time (06/13/2022); [place] ground is still a problem. She feels 80% improved since starting PT (08/10/2022);  Goal status: Nearly met       PLAN: PT FREQUENCY: 1-2x/week   PT DURATION: 12 weeks   PLANNED INTERVENTIONS: Therapeutic exercises, Therapeutic activity, Neuromuscular re-education, Balance training, Gait training, Patient/Family education, Joint mobilization, Stair training, DME instructions, Aquatic Therapy, Dry Needling, Electrical stimulation, Spinal mobilization, Cryotherapy, Moist heat, Manual therapy, and Re-evaluation.   PLAN FOR NEXT SESSION: Patient is now discharged from PT.    Everlean Alstrom. Graylon Good, PT, DPT 08/10/22, 10:50 AM  [facility] [address] [place], [place]  [address] P: [phone] I F: [phone]  addendum to correct visit number.  Everlean Alstrom. Graylon Good, PT, DPT 08/10/22, 10:50 AM

## 2022-08-17 ENCOUNTER — Ambulatory Visit: Payer: Medicare Other | Admitting: Physical Therapy

## 2022-08-29 ENCOUNTER — Ambulatory Visit: Payer: Medicare Other | Admitting: Neurosurgery

## 2022-10-10 ENCOUNTER — Ambulatory Visit (INDEPENDENT_AMBULATORY_CARE_PROVIDER_SITE_OTHER): Payer: Medicare Other | Admitting: Neurosurgery

## 2022-10-10 VITALS — BP 124/74 | Ht 63.5 in | Wt 217.0 lb

## 2022-10-10 DIAGNOSIS — M48062 Spinal stenosis, lumbar region with neurogenic claudication: Secondary | ICD-10-CM

## 2022-10-10 NOTE — Progress Notes (Signed)
   DOS: 02/06/22 (L3-5 decompression)  HISTORY OF PRESENT ILLNESS: 10/10/2022 She still has numbness in her right leg, but it is slightly improved.  Her balance is much better.  She has no back pain. 05/11/2022 [name] is status post the above surgery.  Her leg symptoms have improved significantly, but she still has numbness in her legs which has made it somewhat difficult to walk.  She is doing better than she was last time I saw her.    She got COVID and tested positive on September 8.  She is no longer febrile, but continues to have some upper respiratory symptoms.  PHYSICAL EXAMINATION:   Vitals:   10/10/22 1358  BP: 124/74     General: Patient is well developed, well nourished, calm, collected, and in no apparent distress.  NEUROLOGICAL:  General: In no acute distress.  Awake, alert, oriented to person, place, and time. Pupils equal round and reactive to light.   Strength:  Side Iliopsoas Quads Hamstring PF DF EHL  R 5 5 5 5 5 5  $ L 5 5 5 5 5 5   $ Incision c/d/i   ROS (Neurologic): Negative except as noted above  IMAGING: No interval imaging to review   ASSESSMENT/PLAN:  Brandy Oneill is doing better after lumbar decompression.  At this point I think she is doing well.  I will see her back as needed.  We did discuss neuropathic pain meds, but she would like to stay away from those for now.   I spent a total of 10 minutes in face-to-face and non-face-to-face activities related to this patient's care today.   Meade Maw MD, [facility] Department of Neurosurgery

## 2022-12-12 ENCOUNTER — Ambulatory Visit (INDEPENDENT_AMBULATORY_CARE_PROVIDER_SITE_OTHER): Payer: Medicare Other | Admitting: Physician Assistant

## 2022-12-12 VITALS — BP 124/81 | HR 89 | Ht 63.5 in | Wt 215.0 lb

## 2022-12-12 DIAGNOSIS — R32 Unspecified urinary incontinence: Secondary | ICD-10-CM

## 2022-12-12 MED ORDER — GEMTESA 75 MG PO TABS: 75.0000 mg | ORAL_TABLET | Freq: Every day | ORAL | 0 refills | Status: DC

## 2022-12-12 NOTE — Progress Notes (Signed)
12/12/2022 11:30 AM   Brandy Oneill 1951/05/16 [id number]  CC: Chief Complaint  Patient presents with   Follow-up   HPI: Brandy Oneill is a 72 y.o. female with PMH urinary incontinence s/p InterStim in [place] in 2013 who presents today for InterStim follow-up.  She was last seen in our clinic on 05/15/2019 for InterStim programming.  At that time, she states her symptoms were well-controlled on P2, Amp3.3.  Her battery life was estimated at 6 months to 2.5 years in August 2020.  Today she reports she can no longer use her device programmer even after changing the battery.  She states that for the past 6 to 8 months her urinary symptoms have been out of control.  She continues to take oxybutynin 5 mg twice daily and will occasionally take 10 mg at a time if she is planning to go out.  She feels the oxybutynin poorly controls her symptoms and she notes dry mouth and dry eye on it.  She states today that the InterStim never really worked for her.  At the very least she would like it removed due to MRI incompatibility, however she is curious if the newer generation devices may have better therapeutic benefit for her.  Unclear if she has ever tried [employer].  She states she has not tried any new urinary agents for the past several years.  PMH: Past Medical History:  Diagnosis Date   Anxiety    Arthritis    CAD (coronary artery disease) 04/14/2020   a.) RHC 04/14/2020: mod CAD with sev obstructive Dz in large dom RCA; staged PCI planned. b.) PCI 04/28/2020: DES (unknown type) placed to dRCA.   Depression    Diastolic dysfunction 01/06/2019   a.) TTE 01/06/2019: EF >55%; triv panvalvular regurgitation; G1DD. b.) TTE 01/26/2020: EF > 55%; mod LVH; triv MR/TR/PR; G1DD.   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 b.) TTE 01/26/2020: EF >55%; sev AS (MPG 47.1 mmHg). c.) RHC 04/14/2020: norm CI, PVR, RAP; PCWP 10. d.) s/p TAVR 05/11/2020. e.) TTE 06/01/2020: EF >55%; mild LVH; MPG 9 mmHg. f.) TTE 06/16/2021: EF >55%; mild LVH; GLS -11.6%; MPG 24 mmHg.   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   a.) 23 mm Edwards Sapien S3 bioprosthetic valve   T2DM (type 2 diabetes mellitus) ([facility])    Tubular adenoma of colon    Varicose veins of both legs with edema     Surgical History: Past Surgical History:  Procedure Laterality Date   bladder stimulater  2014   CARDIAC VALVE REPLACEMENT     CATARACT EXTRACTION W/ INTRAOCULAR LENS IMPLANT Bilateral 2014   one eye done then the other eye done a month later   CESAREAN SECTION     COLONOSCOPY WITH PROPOFOL N/A 06/26/2018   Procedure: COLONOSCOPY WITH PROPOFOL;  Surgeon: Toledo, Boykin Nearing, MD;  Location: [facility];  Service: Gastroenterology;  Laterality: N/A;   EYE SURGERY Right 2018   macular  hole   JOINT REPLACEMENT Right 10/27/2016   JOINT REPLACEMENT Left 2014   KNEE CLOSED REDUCTION Right 11/29/2016   Procedure: CLOSED MANIPULATION KNEE;  Surgeon: Erin Sons, MD;  Location: [facility];  Service: Orthopedics;  Laterality: Right;   LUMBAR LAMINECTOMY/DECOMPRESSION MICRODISCECTOMY N/A 02/06/2022   Procedure: L3-5 DECOMPRESSION;  Surgeon: Venetia Night, MD;  Location: [facility] OR[facility];  Service: Neurosurgery;  Laterality: N/A;    Home Medications:  Allergies as of 12/12/2022       Reactions   Kiwi Extract    Sulfa Antibiotics Other (See Comments)        Medication List        Accurate as of December 12, 2022 11:30 AM.  If you have any questions, ask your nurse or doctor.          STOP taking these medications    oxybutynin 5 MG tablet Commonly known as: DITROPAN       TAKE these medications    EPIPEN IJ Inject as directed.   fluticasone 50 MCG/ACT nasal spray Commonly known as: FLONASE SHAKE LQ AND U 1 SPR IEN QD   Gemtesa 75 MG Tabs Generic drug: Vibegron Take 1 tablet (75 mg total) by mouth daily.   lisinopril 40 MG tablet Commonly known as: ZESTRIL Take 40 mg by mouth at bedtime.   metFORMIN 500 MG tablet Commonly known as: GLUCOPHAGE Take 500 mg by mouth 2 (two) times daily with a meal.   multivitamin with minerals Tabs tablet Take 1 tablet by mouth daily. Centrum for Women   pravastatin 10 MG tablet Commonly known as: PRAVACHOL Take 10 mg by mouth every evening.   venlafaxine 37.5 MG tablet Commonly known as: EFFEXOR Take 37.5 mg by mouth daily.        Allergies:  Allergies  Allergen Reactions   Kiwi Extract    Sulfa Antibiotics Other (See Comments)    Family History: Family History  Problem Relation Age of Onset   Breast cancer Neg Hx     Social History:   reports that she quit smoking about 10 years ago. Her smoking use included cigarettes. She has never used smokeless tobacco. She reports that she does not use drugs. No history on file for alcohol use.  Physical Exam: BP 124/81   Pulse 89   Ht 5' 3.5" (1.613 m)   Wt 215 lb (97.5 kg)   BMI 37.49 kg/m   Constitutional:  Alert and oriented, no acute distress, nontoxic appearing HEENT: [place], AT Cardiovascular: No clubbing, cyanosis, or edema Respiratory: Normal respiratory effort, no increased work of breathing GU: Palpable InterStim device in the medial mid right buttock Skin: No rashes, bruises or suspicious lesions Neurologic: Grossly intact, no focal deficits, moving all 4 extremities Psychiatric: Normal mood and affect  Assessment & Plan:   1. Urinary incontinence in female 6 to 8 months of  worsening urinary incontinence.  I strongly suspect her battery has died.  I attempted to use my newer generation controller to connect to her InterStim, and while her device was recognized, it was not able to connect.  Regardless, she is interested in discussing removal versus exchange with Dr. Sherron Monday.  I gave her 6 weeks of samples of Gemtesa, as it is unlikely that she has ever tried this.  Will stop oxybutynin, as I do not think the dose she is on is appropriately therapeutic for her and I worry about long-term use and the risk for cognitive decline.  Will have her follow-up with Dr.  MacDiarmid to discuss symptom improvement on this medication and InterStim as above. - Vibegron (GEMTESA) 75 MG TABS; Take 1 tablet (75 mg total) by mouth daily.  Dispense: 42 tablet; Refill: 0  Return in about 6 weeks (around 01/23/2023) for Symptom recheck and InterStim follow-up with Dr. Sherron Monday.  Carman Ching, PA-C  [facility] [place] [address], [address] [place], [place] [address] [phone]

## 2023-01-12 ENCOUNTER — Other Ambulatory Visit: Payer: Self-pay | Admitting: Internal Medicine

## 2023-01-12 DIAGNOSIS — Z1231 Encounter for screening mammogram for malignant neoplasm of breast: Secondary | ICD-10-CM

## 2023-02-05 ENCOUNTER — Ambulatory Visit: Payer: Medicare Other | Admitting: Physician Assistant

## 2023-02-05 ENCOUNTER — Ambulatory Visit (INDEPENDENT_AMBULATORY_CARE_PROVIDER_SITE_OTHER): Payer: Medicare Other | Admitting: Urology

## 2023-02-05 VITALS — BP 162/87 | HR 87 | Ht 63.5 in | Wt 210.0 lb

## 2023-02-05 DIAGNOSIS — Z469 Encounter for fitting and adjustment of unspecified device: Secondary | ICD-10-CM

## 2023-02-05 DIAGNOSIS — R32 Unspecified urinary incontinence: Secondary | ICD-10-CM

## 2023-02-05 DIAGNOSIS — N3946 Mixed incontinence: Secondary | ICD-10-CM

## 2023-02-05 DIAGNOSIS — Z459 Encounter for adjustment and management of unspecified implanted device: Secondary | ICD-10-CM

## 2023-02-05 MED ORDER — MIRABEGRON ER 50 MG PO TB24: 50.0000 mg | ORAL_TABLET | Freq: Every day | ORAL | 0 refills | Status: DC

## 2023-02-05 NOTE — Progress Notes (Signed)
02/05/2023 9:00 AM   Marius Ditch 1951/03/22 [id number]  Referring provider: Barbette Reichmann, MD [address] [facility] [place],  [place] [address]  Chief Complaint  Patient presents with   Follow-up    INTERSTIM follow up     HPI: I was consulted to assess the patient is urinary incontinence worsening over number years.  She describes an InterStim device placed in [place] in 2013.  Based upon the history I think it had minimal benefit to none.  She currently sometimes leaks with coughing sneezing bending lifting.  She can leak when she steps.  Her primary symptom is urge incontinence.  She has small volume bedwetting.  She sometimes has foot on the floor syndrome.  She wears 3 or 4 pads a day quite wet   She voids every 1 hour and cannot hold it for 2 hours.  She gets up 3-4 times a night   She describes a potential bladder injury or stones treated with lithotripsy and probably ureteroscopy.  She does not think she actually had bladder surgery.     The patient has mixed incontinence but the primary symptom is urge incontinence.  She may have small volume bedwetting.  She has hourly frequency and moderate nocturia.  The device with its IPG is just lateral on the right side to the gluteal cleft somewhat out of the ordinary.  Impedance was within normal limits on all 4 leads.  Device had to be turned up to 2.3-3.6 for vaginal sensation in all 4 leads.  She will be troublehooted as protocol with Maralyn Sago.  We will proceed accordingly.  Were hoping to improve his current function.  She cannot remember taking Myrbetriq but states she tried all the available products   Patient is doing great since we turned up the apple to last visit.  Clinically no infections.  Minimal frequency and incontinence.  On oxybutynin and prescription renewed.  Impedance check normal and battery life 6 months to 2-1/2 years   Today Patient is now taking 2 oxybutynin in the morning and 2 at  night.  She is on a bad day she can soak 2 or 3 pads.  It depends on fluid intake.  She is not sure what the stimulator actually works.  She will try different programs.  She will feel it and then it stops.  It is difficult to know if is just her loss of sensation of it over time versus the IPG turning off.  She says she is tried most medications.  She agrees we may be reaching the end of her algorithm.  I did not approach Botox today.    Maralyn Sago is  going to try to get a baseline and troubleshoot the device and keep me posted.  We will renew the oxybutynin.  Patient is never had Botox.  Maralyn Sago told me that the impedance was within normal limits.  She could feel vaginal sensation at low settings in all 4-lead positions.  Battery life is 18 months or less.  This does point to the fact of the device itself may have never worked that well and replacing it may not be ideal but it could be considered pending patient requests  Today I last saw the patient in 2020.  She saw our nurse practitioner in April 2024.  The patient noted that in September 2020 her symptoms were well-controlled.  She noted in the last 6 months her incontinence got a lot worse even though she was on oxybutynin twice  a day sometimes augmented by 10 mg.  Then she said it did not work that well.  He was curious to wonder if the new device would work better.  She was given [place].  It was unclear if she has ever tried [employer].  Patient still has urge incontinence in spite of Gemtesa.  Clinically not infected.  She would like to have the device removed since twice she is wanted an MRI.  Again the device was quite medial on the right side.  PMH: Past Medical History:  Diagnosis Date   Anxiety    Arthritis    CAD (coronary artery disease) 04/14/2020   a.) RHC 04/14/2020: mod CAD with sev obstructive Dz in large dom RCA; staged PCI planned. b.) PCI 04/28/2020: DES (unknown type) placed to dRCA.   Depression    Diastolic dysfunction  01/06/2019   a.) TTE 01/06/2019: EF >55%; triv panvalvular regurgitation; G1DD. b.) TTE 01/26/2020: EF > 55%; mod LVH; triv MR/TR/PR; G1DD.   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 b.) TTE 01/26/2020: EF >55%; sev AS (MPG 47.1 mmHg). c.) RHC 04/14/2020: norm CI, PVR, RAP; PCWP 10. d.) s/p TAVR 05/11/2020. e.) TTE 06/01/2020: EF >55%; mild LVH; MPG 9 mmHg. f.) TTE 06/16/2021: EF >55%; mild LVH; GLS -11.6%; MPG 24 mmHg.   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   a.) 23 mm Edwards Sapien S3 bioprosthetic valve   T2DM (type 2 diabetes mellitus) (HCC)    Tubular adenoma of colon    Varicose veins of both legs with edema     Surgical History: Past Surgical History:  Procedure Laterality Date   bladder stimulater  2014   CARDIAC VALVE REPLACEMENT     CATARACT EXTRACTION W/ INTRAOCULAR LENS IMPLANT Bilateral 2014   one eye done then the other eye done a month later   CESAREAN SECTION     COLONOSCOPY WITH PROPOFOL N/A 06/26/2018   Procedure: COLONOSCOPY WITH PROPOFOL;  Surgeon: Toledo, Boykin Nearing, MD;  Location: [facility];  Service: Gastroenterology;  Laterality: N/A;   EYE SURGERY Right 2018   macular hole   JOINT REPLACEMENT Right 10/27/2016   JOINT REPLACEMENT Left 2014   KNEE CLOSED REDUCTION Right 11/29/2016   Procedure: CLOSED MANIPULATION KNEE;  Surgeon: Erin Sons, MD;  Location: [facility] ORS;  Service: Orthopedics;  Laterality: Right;   LUMBAR LAMINECTOMY/DECOMPRESSION  MICRODISCECTOMY N/A 02/06/2022   Procedure: L3-5 DECOMPRESSION;  Surgeon: Venetia Night, MD;  Location: [facility] ORS;  Service: Neurosurgery;  Laterality: N/A;    Home Medications:  Allergies as of 02/05/2023       Reactions   Kiwi Extract    Sulfa Antibiotics Other (See Comments)        Medication List        Accurate as of February 05, 2023  9:00 AM. If you have any questions, ask your nurse or doctor.          EPIPEN IJ Inject as directed.  fluticasone 50 MCG/ACT nasal spray Commonly known as: FLONASE SHAKE LQ AND U 1 SPR IEN QD   Gemtesa 75 MG Tabs Generic drug: Vibegron Take 1 tablet (75 mg total) by mouth daily.   lisinopril 40 MG tablet Commonly known as: ZESTRIL Take 40 mg by mouth at bedtime.   metFORMIN 500 MG tablet Commonly known as: GLUCOPHAGE Take 500 mg by mouth 2 (two) times daily with a meal.   multivitamin with minerals Tabs tablet Take 1 tablet by mouth daily. Centrum for Women   pravastatin 10 MG tablet Commonly known as: PRAVACHOL Take 10 mg by mouth every evening.   venlafaxine 37.5 MG tablet Commonly known as: EFFEXOR Take 37.5 mg by mouth daily.        Allergies:  Allergies  Allergen Reactions   Kiwi Extract    Sulfa Antibiotics Other (See Comments)    Family History: Family History  Problem Relation Age of Onset   Breast cancer Neg Hx     Social History:  reports that she quit smoking about 10 years ago. Her smoking use included cigarettes. She has never used smokeless tobacco. She reports that she does not use drugs. No history on file for alcohol use.  ROS:                                        Physical Exam: There were no vitals taken for this visit.  Constitutional:  Alert and oriented, No acute distress. HEENT: [place] AT, moist mucus membranes.  Trachea midline, no masses.  Laboratory Data: Lab Results  Component Value Date   WBC 7.3 12/10/2020   HGB 14.4 12/10/2020   HCT 42.2  12/10/2020   MCV 89.6 12/10/2020   PLT 183 12/10/2020    Lab Results  Component Value Date   CREATININE 0.93 02/06/2022    No results found for: "PSA"  No results found for: "TESTOSTERONE"  No results found for: "HGBA1C"  Urinalysis    Component Value Date/Time   COLORURINE YELLOW (A) 01/27/2022 1144   APPEARANCEUR HAZY (A) 01/27/2022 1144   LABSPEC 1.014 01/27/2022 1144   PHURINE 5.0 01/27/2022 1144   GLUCOSEU NEGATIVE 01/27/2022 1144   HGBUR NEGATIVE 01/27/2022 1144   BILIRUBINUR NEGATIVE 01/27/2022 1144   KETONESUR NEGATIVE 01/27/2022 1144   PROTEINUR NEGATIVE 01/27/2022 1144   NITRITE NEGATIVE 01/27/2022 1144   LEUKOCYTESUR NEGATIVE 01/27/2022 1144    Pertinent Imaging:   Assessment & Plan: I went over removal of InterStim in detail.  I will get an AP and lateral view of the sacrum to make certain there is no fracture or other issues.  Pros cons risk discussed.  Postoperative course discussed.  I stressed retained tip and not being able to have an MRI.  We will proceed accordingly.  She will try Myrbetriq 5 mg samples and prescription.  Botox is a future option for her  1. Urinary incontinence in female  - Urinalysis, Complete   No follow-ups on file.  Martina Sinner, MD  [facility] [address], [address] [place], [place] [address] [phone]

## 2023-02-06 ENCOUNTER — Ambulatory Visit
Admission: RE | Admit: 2023-02-06 | Discharge: 2023-02-06 | Disposition: A | Discharge status: Home / Self Care | Payer: Medicare Other | Source: Ambulatory Visit | Attending: Urology | Admitting: Urology

## 2023-02-06 DIAGNOSIS — Z459 Encounter for adjustment and management of unspecified implanted device: Secondary | ICD-10-CM

## 2023-02-06 DIAGNOSIS — Z469 Encounter for fitting and adjustment of unspecified device: Secondary | ICD-10-CM | POA: Insufficient documentation

## 2023-02-06 DIAGNOSIS — R32 Unspecified urinary incontinence: Secondary | ICD-10-CM | POA: Insufficient documentation

## 2023-02-09 ENCOUNTER — Encounter: Payer: Self-pay | Admitting: Urology

## 2023-02-12 ENCOUNTER — Telehealth: Payer: Self-pay

## 2023-02-12 NOTE — Telephone Encounter (Signed)
Given paper orders for surgery by Dr. Sherron Monday. Tried to call patient to schedule surgery. No answer. Will try again. I did leave a detailed message with my contact information.

## 2023-02-13 ENCOUNTER — Other Ambulatory Visit: Payer: Self-pay

## 2023-02-13 ENCOUNTER — Telehealth: Payer: Self-pay

## 2023-02-13 DIAGNOSIS — T83110A Breakdown (mechanical) of urinary electronic stimulator device, initial encounter: Secondary | ICD-10-CM

## 2023-02-13 DIAGNOSIS — R32 Unspecified urinary incontinence: Secondary | ICD-10-CM

## 2023-02-13 NOTE — Progress Notes (Signed)
   [facility] Surgical Posting Form  Surgery Date: Date: 02/26/2023  Surgeon: Dr. Alfredo Martinez, MD  Inpt ( No  )   Outpt (Yes)   Obs ( No  )   Diagnosis: T83.110A Interstim Malfunctioning; R32 Urinary Incontinence  -CPT: 11914, [phone]  Surgery: Removal of Interstim  Stop Anticoagulations: N/A   Cardiac/Medical/Pulmonary Clearance needed: no  *Orders entered into EPIC  Date: 02/13/23   *Case booked in EPIC  Date: 02/13/23  *Notified pt of Surgery: Date: 02/13/23  PRE-OP UA & CX: no  *Placed into Prior Authorization Work Que Date: 02/13/23  Assistant/laser/rep:No

## 2023-02-13 NOTE — Telephone Encounter (Signed)
I spoke with Brandy Oneill. We have discussed possible surgery dates and Monday July 8th, 2024 was agreed upon by all parties. Patient given information about surgery date, what to expect pre-operatively and post operatively.  We discussed that a Pre-Admission Testing office will be calling to set up the pre-op visit that will take place prior to surgery, and that these appointments are typically done over the phone with a Pre-Admissions RN. Informed patient that our office will communicate any additional care to be provided after surgery. Patients questions or concerns were discussed during our call. Advised to call our office should there be any additional information, questions or concerns that arise. Patient verbalized understanding.

## 2023-02-16 ENCOUNTER — Other Ambulatory Visit: Payer: Self-pay

## 2023-02-16 ENCOUNTER — Encounter
Admission: RE | Admit: 2023-02-16 | Discharge: 2023-02-16 | Disposition: A | Discharge status: Home / Self Care | Payer: Medicare Other | Source: Ambulatory Visit | Attending: Urology | Admitting: Urology

## 2023-02-16 ENCOUNTER — Encounter: Payer: Self-pay | Admitting: Urology

## 2023-02-16 DIAGNOSIS — I10 Essential (primary) hypertension: Secondary | ICD-10-CM

## 2023-02-16 DIAGNOSIS — Z01812 Encounter for preprocedural laboratory examination: Secondary | ICD-10-CM

## 2023-02-16 HISTORY — DX: Pneumonia, unspecified organism: J18.9

## 2023-02-16 NOTE — Progress Notes (Signed)
Perioperative / Anesthesia Services  Pre-Admission Testing Clinical Review / Preoperative Anesthesia Consult  Date: 02/21/23  Patient Demographics:  Name: Brandy Oneill DOB:   1951-01-09 MRN:   [id number]  Planned Surgical Procedure(s):    Case: [id number] Date/Time: 02/26/23 1245   Procedure: REMOVAL OF INTERSTIM IMPLANT   Anesthesia type: Monitor Anesthesia Care   Pre-op diagnosis: Malfunctioning Interstim   Location: [facility] OR ROOM 08 / [facility]   Surgeons: Alfredo Martinez, MD     NOTE: Available PAT nursing documentation and vital signs have been reviewed. Clinical nursing staff has updated patient's PMH/PSHx, current medication list, and drug allergies/intolerances to ensure comprehensive history available to assist in medical decision making as it pertains to the aforementioned surgical procedure and anticipated anesthetic course. Extensive review of available clinical information personally performed. [place] PMH and PSHx updated with any diagnoses/procedures that  may have been inadvertently omitted during her intake with the pre-admission testing department's nursing staff.  Clinical Discussion:  Brandy Erbes is a 72 y.o. female who is submitted for pre-surgical anesthesia review and clearance prior to her undergoing the above procedure. Patient is a Current Smoker (quit 11/2012). Pertinent PMH includes: CAD, severe aortic valve stenosis (s/p TAVR), diastolic dysfunction, aortic atherosclerosis, HTN, HLD, T2DM, lumbar spinal stenosis, lymphedema, OA, lumbar DDD (s/p L3-L5 decompression), anxiety, depression.   Patient is followed by cardiology Charmaine Downs, MD). She was last seen in the cardiology clinic on 06/22/2022; notes reviewed. At the time of her clinic visit, patient doing well overall from a cardiovascular perspective. Patient denied any chest pain, shortness of breath, PND, orthopnea, palpitations, significant peripheral edema, weakness,  fatigue, vertiginous symptoms, or presyncope/syncope. Patient with a past medical history significant for cardiovascular diagnoses. Documented physical exam was grossly benign, providing no evidence of acute exacerbation and/or decompensation of the patient's known cardiovascular conditions.  Patient found to have moderate nonrheumatic aortic valve stenosis on TTE performed 01/06/2019.  EF >55%.  Mean transvalvular gradient was 34.1 mmHg.     Subsequent echocardiogram performed on 01/26/2020 revealed progression of the valvular stenosis with a mean pressure gradient of 47.1 mmHg.     Diagnostic RIGHT/LEFT heart catheterization performed on 04/14/2020 revealing normal cardiac index, PVR, and right atrial pressures.  PCWP 10 mmHg.  There was moderate CAD with severe obstructive disease and a large dominant RCA.  Staged PCI was performed.  Patient underwent PCI on 04/28/2020 with a DES x1 (unknown type) being placed to the distal RCA with excellent angiographic result and TIMI-3 flow.     Given the progression of her valvular disease, patient underwent TAVR on 05/11/2020; 23 mm Edwards Sapien S3 bioprosthetic valve placed.   Post TAVR echocardiogram revealed a well-seated and functioning bioprosthetic valve with a mean pressure gradient of 9 mmHg.  Most recent TTE was performed on 06/22/2022 revealing a normal left ventricular systolic function with an EF of >55%.  There was mild concentric LVH.  There were no regional wall motion abnormalities.  Right ventricular size and function normal.  There was trivial mitral and tricuspid valve regurgitation.  Bioprosthetic aortic valve well-seated and functioning properly; mean pressure gradient 16 mmHg with an AVA of 1.3 cm.  Resting LVOT velocity 1.3 m/s. Compared to prior TTE, patient with decreased gradients across the TAVR.  Hyperdynamic left ventricular cavity with a resting mid LV gradient of 11.34 mmHg (1.8 m/s) that increases to 44.27 mmHg (3.3 m/s) with  Valsalva. There was no LVOT obstruction seen.  Blood pressure reasonably controlled at  140/68 mmHg on currently prescribed ACEi (lisinopril) monotherapy.  Patient is on pravastatin for her HLD diagnosis and ASCVD prevention.  T2DM well-controlled at the time of her last cardiology visit; HgbA1c was 6.4% when checked on 06/21/2022.  Of note, Hgb A1c has been rechecked since last cardiology visit and found to be within normal range at 7.0% on 01/10/2023.  Patient does not have an OSAH diagnosis.  Patient making efforts to remain active.  Patient able to complete all ADLs/IADLs without cardiovascular limitation.  Per the [facility], patient able to complete >4 METS of physical activity without experiencing any degree of significant angina/anginal equivalent symptoms.  No changes were made to her medication regimen.  Patient to follow-up with outpatient cardiology in 1 year or sooner if needed.  Brandy Oneill is scheduled for REMOVAL OF INTERSTIM IMPLANT on 02/25/2022 with Dr. Alfredo Martinez, MD. Device non-function since at least 2020 per patient report. Device was initially place in [place] back in 2013 due to her "bladder being damaged from attempts to break up kidney stones". Given patient's past medical history significant for cardiovascular diagnoses, presurgical cardiac clearance was sought by the PAT team. Per cardiology, "this patient is optimized for surgery and may proceed with the planned procedural course with a LOW risk of significant perioperative cardiovascular complications".    In review of her medication reconciliation, the patient is not noted to be taking any type of anticoagulation or antiplatelet therapies that would need to be held during her perioperative course.  Patient reports previous perioperative complications with anesthesia in the past. Patient has a PMH (+) for PONV associated with a procedure to treat urolithiasis back in 2001; none since. Symptoms and history of PONV will be  discussed with patient by anesthesia team on the day of her procedure. Interventions will be ordered as deemed necessary based on patient's individual care needs as determined by anesthesiologist. Additionally, patient with a (+) family history of postoperative delirium in a first degree relative (sister).  In review of the available records, it is noted that patient underwent a general anesthetic course here at [facility] (ASA III) in 01/2022 without documented complications.      02/05/2023    9:03 AM 12/12/2022   10:09 AM 10/10/2022    1:58 PM  Vitals with BMI  Height 5' 3.5" 5' 3.5" 5' 3.5"  Weight 210 lbs 215 lbs 217 lbs  BMI 36.61 37.48 37.83  Systolic 162 124 161  Diastolic 87 81 74  Pulse 87 89     Providers/Specialists:   NOTE: Primary physician provider listed below. Patient may have been seen by APP or partner within same practice.   PROVIDER ROLE / SPECIALTY LAST Maricela Bo, MD Urology (Surgeon) 02/05/2023  Barbette Reichmann, MD Primary Care Provider 01/11/2023  Youlanda Mighty, MD Cardiology 06/16/2021  Celso Amy, MD Cardiothoracic Surgery 06/22/2022  Merri Ray, MD Physiatry 10/21/2021   Allergies:  Kiwi extract and Sulfa antibiotics  Current Home Medications:   No current facility-administered medications for this encounter.    calcium carbonate (TUMS EX) 750 MG chewable tablet   cyanocobalamin (VITAMIN B12) 1000 MCG tablet   EPINEPHrine (EPIPEN IJ)   fexofenadine (ALLEGRA) 180 MG tablet   fluticasone (FLONASE) 50 MCG/ACT nasal spray   lisinopril (PRINIVIL,ZESTRIL) 40 MG tablet   metFORMIN (GLUCOPHAGE) 500 MG tablet   Multiple Vitamin (MULTIVITAMIN WITH MINERALS) TABS tablet   naproxen sodium (ALEVE) 220 MG tablet   pravastatin (PRAVACHOL) 10 MG tablet  venlafaxine (EFFEXOR) 37.5 MG tablet   Vibegron (GEMTESA) 75 MG TABS   History:   Past Medical History:  Diagnosis Date   Anxiety    Aortic  atherosclerosis (HCC)    Arthritis    CAD (coronary artery disease) 04/14/2020   a.) R/LHC 04/14/2020: mod CAD with sev obstructive disease in large dom RCA; staged PCI planned. b.) PCI 04/28/2020: DES (unknown type) placed to dRCA.   DDD (degenerative disc disease), lumbar    a.) s/p L3-L5 decompression 02/06/2022   Depression    Diastolic dysfunction 01/06/2019   a.) TTE 01/06/2019: EF >55%; triv panvalvular regurgitation; G1DD. 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 b.) TTE 01/26/2020: EF >55%; sev AS (MPG 47.1 mmHg). c.) RHC 04/14/2020: norm CI, PVR, RAP; PCWP 10. d.) s/p TAVR 05/11/2020. e.) TTE 06/01/2020: EF >55%; mild LVH; MPG 9 mmHg. f.) TTE 06/16/2021: EF >55%; mild LVH; GLS -11.6%; MPG 24 mmHg.   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   a.) 23 mm Edwards Sapien S3 bioprosthetic valve   Status post implantation of urinary electronic stimulator device 2013   a.) placed in [place] back in 2013 after "bladder was damaged from attempts to break up kidney stones" per patient report; b.) device failed/stopped working just before [employer] (~ 2020); c.) plans for removal 02/2023   T2DM (type 2 diabetes mellitus) (HCC)    Tubular adenoma of colon     Varicose veins of both legs with edema    Past Surgical History:  Procedure Laterality Date   BLADDER STIMULATOR PLACEMENT  2013   Placed in [place]   CATARACT EXTRACTION W/ INTRAOCULAR LENS IMPLANT Bilateral 2014   CESAREAN SECTION     COLONOSCOPY WITH PROPOFOL N/A 06/26/2018   Procedure: COLONOSCOPY WITH PROPOFOL;  Surgeon: Toledo, Boykin Nearing, MD;  Location: [facility] ENDOSCOPY;  Service: Gastroenterology;  Laterality: N/A;   JOINT REPLACEMENT Right 10/27/2016   JOINT REPLACEMENT Left 2014   KNEE CLOSED REDUCTION Right 11/29/2016   Procedure: CLOSED MANIPULATION KNEE;  Surgeon: Erin Sons, MD;  Location: [facility] ORS;  Service: Orthopedics;  Laterality: Right;   LUMBAR LAMINECTOMY/DECOMPRESSION MICRODISCECTOMY N/A 02/06/2022   Procedure: L3-5 DECOMPRESSION;  Surgeon: Venetia Night, MD;  Location: [facility] ORS;  Service: Neurosurgery;  Laterality: N/A;   PARS PLANA VITRECTOMY Right 2018   Procedure: VITRECTOMY, MECHANICAL, PARS PLANA APPROACH; WITH REMOVAL PRERETINAL CELLULAR MEMBRANE(EG, MACULAR PUCKER); Surgeon: Melvyn Novas, MD; Location: [facility]; Service: Ophthalmology   PERCUTANEOUS CORONARY STENT INTERVENTION (PCI-S)  04/28/2020   Procedure: PERCUTANEOUS CORONARY STENT INTERVENTION (PCI-S); Location: [facility]   RIGHT/LEFT HEART CATH AND CORONARY ANGIOGRAPHY Right 04/14/2020   Procedure: RIGHT/LEFT HEART CATH AND CORONARY ANGIOGRAPHY; Location: [facility]  TRANSCATHETER AORTIC VALVE REPLACEMENT, TRANSFEMORAL N/A 05/11/2020   ProcedureL TRANSCATHETER AORTIC VALVE REPLACEMENT, TRANSFEMORAL; Location: [facility]; Surgeon: Celso Amy, MD   No family history on file.  Social History   Tobacco Use   Smoking status: Former    Types: Cigarettes    Quit date: 11/27/2012    Years since quitting: 10.2   Smokeless tobacco: Never  Substance Use Topics   Alcohol use: Yes    Comment: rare 1 drink per month   Drug use: No    Pertinent Clinical Results:  LABS:     Component Ref Range & Units 01/10/2023  WBC (White Blood Cell Count) 4.1 - 10.2 10^3/uL 7.9  RBC (Red Blood Cell Count) 4.04 - 5.48 10^6/uL 4.99  Hemoglobin 12.0 - 15.0 gm/dL 47.8 High   Hematocrit 35.0 - 47.0 % 45.1  MCV (Mean Corpuscular Volume) 80.0 - 100.0 fl 90.4  MCH (Mean Corpuscular Hemoglobin) 27.0 - 31.2 pg 30.3  MCHC (Mean Corpuscular Hemoglobin Concentration) 32.0 - 36.0 gm/dL 29.5  Platelet Count 621 - 450 10^3/uL 274  RDW-CV (Red Cell Distribution Width) 11.6 - 14.8 % 13.1  MPV (Mean Platelet Volume) 9.4 - 12.4 fl 10.0  Neutrophils 1.50 - 7.80 10^3/uL 5.10  Lymphocytes 1.00 - 3.60 10^3/uL 1.85  Monocytes 0.00 - 1.50 10^3/uL 0.48  Eosinophils 0.00 - 0.55 10^3/uL 0.34  Basophils 0.00 - 0.09 10^3/uL 0.07  Neutrophil % 32.0 - 70.0 % 64.7  Lymphocyte % 10.0 - 50.0 % 23.5  Monocyte % 4.0 - 13.0 % 6.1  Eosinophil % 1.0 - 5.0 % 4.3  Basophil% 0.0 - 2.0 % 0.9  Immature Granulocyte % <=0.7 % 0.5  Immature Granulocyte Count <=0.06 10^3/L 0.04  Resulting Agency [facility]  Specimen Collected: 01/10/23 10:15   Performed by: [facility] Last Resulted: 01/10/23 11:34  Received From: [facility]  Result Received: 02/05/23 08:53   Component Ref Range & Units 01/10/2023  Glucose 70 - 110 mg/dL 308 High   Sodium 657 - 145 mmol/L 141  Potassium 3.6 - 5.1 mmol/L 4.6  Chloride 97 - 109 mmol/L 104  Carbon Dioxide (CO2) 22.0 - 32.0 mmol/L 32.1 High   Urea Nitrogen (BUN) 7 - 25 mg/dL 20  Creatinine 0.6 - 1.1 mg/dL 0.9  Glomerular Filtration Rate (eGFR) >60 mL/min/1.73sq m 68  Calcium 8.7 - 10.3 mg/dL 9.2  AST 8 - 39 U/L 16  ALT 5 - 38 U/L 14  Alk Phos (alkaline Phosphatase) 34 - 104 U/L 59  Albumin 3.5 - 4.8 g/dL 4.1  Bilirubin, Total 0.3 - 1.2 mg/dL 0.5  Protein, Total 6.1 - 7.9 g/dL 7.1  A/G Ratio 1.0 - 5.0 gm/dL 1.4  Resulting Agency [facility]  Specimen Collected:  01/10/23 10:15   Performed by: [facility] Last Resulted: 01/10/23 13:26  Received From: [facility]  Result Received: 02/05/23 08:53   Component Ref Range & Units 01/10/2023  Hemoglobin A1C 4.2 - 5.6 % 7.0 High   Average Blood Glucose (Calc) mg/dL 846  Resulting Agency [facility]  Narrative Performed by [facility] Normal Range:    4.2 - 5.6% Increased Risk:  5.7 - 6.4% Diabetes:        >= 6.5% Glycemic Control for adults with diabetes:  <7%   Specimen Collected: 01/10/23 10:15   Performed by: [facility] Last Resulted: 01/10/23 12:18  Received From:  [facility]  Result Received: 02/05/23 08:53    ECG: Date: 02/20/2023 Time ECG obtained: 1011 AM Rate: 97 bpm Rhythm: normal sinus Axis (leads I and aVF): Left axis deviation Intervals: PR 130 ms. QRS 80 ms. QTc 444 ms. ST segment and T wave changes: No evidence of acute ST segment elevation or depression.  Evidence of an age undetermined anterior infarct present. Comparison: Similar to previous tracing obtained on 01/27/2022   IMAGING / PROCEDURES: TRANSTHORACIC ECHOCARDIOGRAM performed on 06/22/2022 Normal left ventricular systolic function with an EF of >55% Mild concentric LVH No regional wall motion abnormalities Trivial MR and TR Well-seated bioprosthetic aortic valve with a mean transvalvular gradient of 16 mmHg; AVA = 1.3 cm Hyperdynamic LV cavity with a resting mid LV gradient of 11.34 mmHg (1.8 m/s) that increases to 44.27 mmHg (3.3 m/s) with Valsalva. No LVOT obstruction seen  CT LUMBAR SPINE W CONTRAST; CT MYELOGRAM performed on 12/30/2021 Successful lumbar puncture at L5-S1 and intrathecal contrast injection for lumbar myelogram.  Notable interruption of contrast flow at L3-L4 and L4-L5 consistent with spinal canal stenosis.  Multilevel degenerative changes of lumbar spine, most significant at L4-L5, where there is  grade 1 anterolisthesis, broad-based disc bulging and advanced bilateral facet arthropathy resulting in severe spinal canal stenosis with effacement of the thecal sac.  Mild bilateral neural foraminal stenosis at L4-L5. Moderate-severe spinal canal stenosis with partial effacement of the thecal sac at L3-L4, and with mild to moderate left-sided neural foraminal stenosis at this level. Foraminal endplate spurring and facet spurring abuts the right exiting extraforaminal nerve root at L5-S1. Mild left neural foraminal stenosis at this level.  Mild spinal canal and left-sided neural foraminal narrowing at L2-L3.   CT CERVICAL SPINE WO CONTRAST performed on 08/01/2021 LEFT frontal scalp hematoma. No acute intracranial abnormality seen Severe multilevel degenerative disc disease is noted in the cervical spine; no acute abnormality noted.   LEFT HEART CATHETERIZATION AND PERCUTANEOUS CORONARY INTERVENTION performed on 04/28/2020 Single-vessel CAD Successful PCI with DES x1 placed to the distal RCA Continue DAPT therapy (ASA + clopidogrel) for >/= 1 year Schedule TAVR to treat AS   RIGHT HEART CATHETERIZATION performed on 04/14/2020 Normal cardiac index, PVR, RA pressure PCWP 10 mmHg Moderate CAD with severe obstructive disease in a large dominant RCA Calcified aortic valve leaflets with reduced leaflet mobility consistent with the echo Doppler diagnosis of aortic valve stenosis Review at the [facility] multidisciplinary valve treatment conference this coming Friday for SAVR/CABG versus PCI/TAVR.  Legrand Rams the latter clinically because of limited mobility, obesity, spine disease leading to surgical frailty.   Impression and Plan:  Maciel Langlois has been referred for pre-anesthesia review and clearance prior to her undergoing the planned anesthetic and procedural courses. Available labs, pertinent testing, and imaging results were personally reviewed by me in preparation for upcoming operative/procedural  course. [facility] medical record has been updated following extensive record review and patient interview with PAT staff.   This patient has been appropriately cleared by cardiology with an overall LOW risk of experiencing significant perioperative cardiovascular complications. Based on clinical review performed today (02/21/23), barring any significant acute changes in the patient's overall condition, it is anticipated that she will be able to proceed with the planned surgical intervention. Any acute changes in clinical condition may necessitate her procedure being postponed and/or cancelled. Patient will meet with anesthesia team (MD and/or CRNA) on the day of her procedure for preoperative evaluation/assessment. Questions regarding anesthetic course will be fielded at that  time.   Pre-surgical instructions were reviewed with the patient during her PAT appointment, and questions were fielded to satisfaction by PAT clinical staff. She has been instructed on which medications that she will need to hold prior to surgery, as well as the ones that have been deemed safe/appropriate to take on the day of her procedure. As part of the general education provided by PAT, patient made aware both verbally and in writing, that she would need to abstain from the use of any illegal substances during her perioperative course.  She was advised that failure to follow the provided instructions could necessitate case cancellation or result in serious perioperative complications up to and including death. Patient encouraged to contact PAT and/or her surgeon's office to discuss any questions or concerns that may arise prior to surgery; verbalized understanding.   Quentin Mulling, MSN, APRN, FNP-C, CEN [facility]  Peri-operative Services Nurse Practitioner Phone: [phone] Fax: [phone] 02/21/23 4:17 PM  NOTE: This note has been prepared using Dragon dictation software. Despite my best ability to  proofread, there is always the potential that unintentional transcriptional errors may still occur from this process.

## 2023-02-16 NOTE — Patient Instructions (Addendum)
Your procedure is scheduled on: 02/26/23 - Monday Report to the Registration Desk on the 1st floor of the [facility]. To find out your arrival time, please call [phone] between 1PM - 3PM on: 02/23/23 - Friday If your arrival time is 6:00 am, do not arrive before that time as the [facility] entrance doors do not open until 6:00 am.  REMEMBER: Instructions that are not followed completely may result in serious medical risk, up to and including death; or upon the discretion of your surgeon and anesthesiologist your surgery may need to be rescheduled.  Do not eat food or drink any liquids after midnight the night before surgery.  No gum chewing or hard candies.  Stop beginning 02/19/23 , Anti-inflammatories (NSAIDS) such as Advil, Aleve, Ibuprofen, Motrin, Naproxen, Naprosyn and Aspirin based products such as Excedrin, Goody's Powder, BC Powder.  Stop ANY OVER THE COUNTER supplements until after surgery beginning 02/19/23.  You may take Tylenol if needed for pain up until the day of surgery.  Continue taking all prescribed medications with the exception of the following:  metFORMIN (GLUCOPHAGE) hold for 2 days beginning 02/24/23.   TAKE ONLY THESE MEDICATIONS THE MORNING OF SURGERY WITH A SIP OF WATER:  venlafaxine (EFFEXOR)  Vibegron (GEMTESA)    No Alcohol for 24 hours before or after surgery.  No Smoking including e-cigarettes for 24 hours before surgery.  No chewable tobacco products for at least 6 hours before surgery.  No nicotine patches on the day of surgery.  Do not use any "recreational" drugs for at least a week (preferably 2 weeks) before your surgery.  Please be advised that the combination of cocaine and anesthesia may have negative outcomes, up to and including death. If you test positive for cocaine, your surgery will be cancelled.  On the morning of surgery brush your teeth with toothpaste and water, you may rinse your mouth with mouthwash if you wish. Do  not swallow any toothpaste or mouthwash.  Use CHG Soap or wipes as directed on instruction sheet.  Do not wear jewelry, make-up, hairpins, clips or nail polish.  Do not wear lotions, powders, or perfumes.   Do not shave body hair from the neck down 48 hours before surgery.  Contact lenses, hearing aids and dentures may not be worn into surgery.  Do not bring valuables to the hospital. [facility] is not responsible for any missing/lost belongings or valuables.   Notify your doctor if there is any change in your medical condition (cold, fever, infection).  Wear comfortable clothing (specific to your surgery type) to the hospital.  After surgery, you can help prevent lung complications by doing breathing exercises.  Take deep breaths and cough every 1-2 hours. Your doctor may order a device called an Incentive Spirometer to help you take deep breaths. When coughing or sneezing, hold a pillow firmly against your incision with both hands. This is called "splinting." Doing this helps protect your incision. It also decreases belly discomfort.  If you are being admitted to the hospital overnight, leave your suitcase in the car. After surgery it may be brought to your room.  In case of increased patient census, it may be necessary for you, the patient, to continue your postoperative care in the Same Day Surgery department.  If you are being discharged the day of surgery, you will not be allowed to drive home. You will need a responsible individual to drive you home and stay with you for 24 hours after surgery.  If you are taking public transportation, you will need to have a responsible individual with you.  Please call the Pre-admissions Testing Dept. at [phone] if you have any questions about these instructions.  Surgery Visitation Policy:  Patients having surgery or a procedure may have two visitors.  Children under the age of 49 must have an adult with them who is not the  patient.  Inpatient Visitation:    Visiting hours are 7 a.m. to 8 p.m. Up to four visitors are allowed at one time in a patient room. The visitors may rotate out with other people during the day.  One visitor age 53 or older may stay with the patient overnight and must be in the room by 8 p.m.     Preparing for Surgery with CHLORHEXIDINE GLUCONATE (CHG) Soap  Chlorhexidine Gluconate (CHG) Soap  o An antiseptic cleaner that kills germs and bonds with the skin to continue killing germs even after washing  o Used for showering the night before surgery and morning of surgery  Before surgery, you can play an important role by reducing the number of germs on your skin.  CHG (Chlorhexidine gluconate) soap is an antiseptic cleanser which kills germs and bonds with the skin to continue killing germs even after washing.  Please do not use if you have an allergy to CHG or antibacterial soaps. If your skin becomes reddened/irritated stop using the CHG.  1. Shower the NIGHT BEFORE SURGERY and the MORNING OF SURGERY with CHG soap.  2. If you choose to wash your hair, wash your hair first as usual with your normal shampoo.  3. After shampooing, rinse your hair and body thoroughly to remove the shampoo.  4. Use CHG as you would any other liquid soap. You can apply CHG directly to the skin and wash gently with a scrungie or a clean washcloth.  5. Apply the CHG soap to your body only from the neck down. Do not use on open wounds or open sores. Avoid contact with your eyes, ears, mouth, and genitals (private parts). Wash face and genitals (private parts) with your normal soap.  6. Wash thoroughly, paying special attention to the area where your surgery will be performed.  7. Thoroughly rinse your body with warm water.  8. Do not shower/wash with your normal soap after using and rinsing off the CHG soap.  9. Pat yourself dry with a clean towel.  10. Wear clean pajamas to bed the night before  surgery.  12. Place clean sheets on your bed the night of your first shower and do not sleep with pets.  13. Shower again with the CHG soap on the day of surgery prior to arriving at the hospital.  14. Do not apply any deodorants/lotions/powders.  15. Please wear clean clothes to the hospital.

## 2023-02-20 ENCOUNTER — Encounter
Admission: RE | Admit: 2023-02-20 | Discharge: 2023-02-20 | Disposition: A | Discharge status: Home / Self Care | Payer: Medicare Other | Source: Ambulatory Visit | Attending: Urology | Admitting: Urology

## 2023-02-20 DIAGNOSIS — I10 Essential (primary) hypertension: Secondary | ICD-10-CM | POA: Insufficient documentation

## 2023-02-20 DIAGNOSIS — Z01812 Encounter for preprocedural laboratory examination: Secondary | ICD-10-CM | POA: Diagnosis present

## 2023-02-20 LAB — SURGICAL PCR SCREEN
MRSA, PCR: NEGATIVE
Staphylococcus aureus: NEGATIVE

## 2023-02-21 NOTE — H&P (Signed)
I was consulted to assess the patient is urinary incontinence worsening over number years.  She describes an InterStim device placed in [place] in 2013.  Based upon the history I think it had minimal benefit to none.  She currently sometimes leaks with coughing sneezing bending lifting.  She can leak when she steps.  Her primary symptom is urge incontinence.  She has small volume bedwetting.  She sometimes has foot on the floor syndrome.  She wears 3 or 4 pads a day quite wet   She voids every 1 hour and cannot hold it for 2 hours.  She gets up 3-4 times a night   She describes a potential bladder injury or stones treated with lithotripsy and probably ureteroscopy.  She does not think she actually had bladder surgery.     The patient has mixed incontinence but the primary symptom is urge incontinence.  She may have small volume bedwetting.  She has hourly frequency and moderate nocturia.  The device with its IPG is just lateral on the right side to the gluteal cleft somewhat out of the ordinary.  Impedance was within normal limits on all 4 leads.  Device had to be turned up to 2.3-3.6 for vaginal sensation in all 4 leads.  She will be troublehooted as protocol with Maralyn Sago.  We will proceed accordingly.  Were hoping to improve his current function.  She cannot remember taking Myrbetriq but states she tried all the available products   Patient is doing great since we turned up the apple to last visit.  Clinically no infections.  Minimal frequency and incontinence.  On oxybutynin and prescription renewed.  Impedance check normal and battery life 6 months to 2-1/2 years   Today Patient is now taking 2 oxybutynin in the morning and 2 at night.  She is on a bad day she can soak 2 or 3 pads.  It depends on fluid intake.  She is not sure what the stimulator actually works.  She will try different programs.  She will feel it and then it stops.  It is difficult to know if is just her loss of sensation of it over  time versus the IPG turning off.  She says she is tried most medications.  She agrees we may be reaching the end of her algorithm.  I did not approach Botox today.     Maralyn Sago is  going to try to get a baseline and troubleshoot the device and keep me posted.  We will renew the oxybutynin.  Patient is never had Botox.  Maralyn Sago told me that the impedance was within normal limits.  She could feel vaginal sensation at low settings in all 4-lead positions.  Battery life is 18 months or less.  This does point to the fact of the device itself may have never worked that well and replacing it may not be ideal but it could be considered pending patient requests   Today I last saw the patient in 2020.  She saw our nurse practitioner in April 2024.  The patient noted that in September 2020 her symptoms were well-controlled.  She noted in the last 6 months her incontinence got a lot worse even though she was on oxybutynin twice a day sometimes augmented by 10 mg.  Then she said it did not work that well.  He was curious to wonder if the new device would work better.  She was given [place].  It was unclear if she has ever tried [employer].  Patient still has urge incontinence in spite of Gemtesa.  Clinically not infected.  She would like to have the device removed since twice she is wanted an MRI.  Again the device was quite medial on the right side.   PMH:     Past Medical History:  Diagnosis Date   Anxiety     Arthritis     CAD (coronary artery disease) 04/14/2020    a.) RHC 04/14/2020: mod CAD with sev obstructive Dz in large dom RCA; staged PCI planned. b.) PCI 04/28/2020: DES (unknown type) placed to dRCA.   Depression     Diastolic dysfunction 01/06/2019    a.) TTE 01/06/2019: EF >55%; triv panvalvular regurgitation; G1DD. b.) TTE 01/26/2020: EF > 55%; mod LVH; triv MR/TR/PR; G1DD.   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 b.) TTE 01/26/2020: EF >55%; sev AS (MPG 47.1 mmHg). c.) RHC 04/14/2020: norm CI, PVR, RAP; PCWP 10. d.) s/p TAVR 05/11/2020. e.) TTE 06/01/2020: EF >55%; mild LVH; MPG 9 mmHg. f.) TTE 06/16/2021: EF >55%; mild LVH; GLS -11.6%; MPG 24 mmHg.   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    a.) 23 mm Edwards Sapien S3 bioprosthetic valve   T2DM (type 2 diabetes mellitus) (HCC)     Tubular adenoma of colon     Varicose veins of both legs with edema        Surgical History:      Past Surgical History:  Procedure Laterality Date   bladder stimulater   2014   CARDIAC VALVE REPLACEMENT       CATARACT EXTRACTION W/ INTRAOCULAR LENS IMPLANT Bilateral 2014    one eye done then the other eye done a month later   CESAREAN SECTION       COLONOSCOPY WITH PROPOFOL N/A 06/26/2018    Procedure: COLONOSCOPY WITH PROPOFOL;  Surgeon: Toledo, Boykin Nearing, MD;  Location: [facility];  Service: Gastroenterology;  Laterality: N/A;   EYE SURGERY Right 2018    macular hole   JOINT REPLACEMENT Right 10/27/2016   JOINT REPLACEMENT Left 2014   KNEE CLOSED REDUCTION Right 11/29/2016    Procedure: CLOSED MANIPULATION KNEE;  Surgeon: Erin Sons, MD;  Location: [facility];  Service: Orthopedics;  Laterality: Right;   LUMBAR LAMINECTOMY/DECOMPRESSION MICRODISCECTOMY N/A 02/06/2022    Procedure: L3-5 DECOMPRESSION;  Surgeon: Venetia Night, MD;  Location: [facility] ORS;  Service: Neurosurgery;  Laterality: N/A;      Home Medications:  Allergies as of 02/05/2023         Reactions    Kiwi  Extract      Sulfa Antibiotics Other (See Comments)            Medication List           Accurate as of February 05, 2023  9:00 AM. If you have any questions, ask your nurse or doctor.  EPIPEN IJ Inject as directed.    fluticasone 50 MCG/ACT nasal spray Commonly known as: FLONASE SHAKE LQ AND U 1 SPR IEN QD    Gemtesa 75 MG Tabs Generic drug: Vibegron Take 1 tablet (75 mg total) by mouth daily.    lisinopril 40 MG tablet Commonly known as: ZESTRIL Take 40 mg by mouth at bedtime.    metFORMIN 500 MG tablet Commonly known as: GLUCOPHAGE Take 500 mg by mouth 2 (two) times daily with a meal.    multivitamin with minerals Tabs tablet Take 1 tablet by mouth daily. Centrum for [employer]    pravastatin 10 MG tablet Commonly known as: PRAVACHOL Take 10 mg by mouth every evening.    venlafaxine 37.5 MG tablet Commonly known as: EFFEXOR Take 37.5 mg by mouth daily.             Allergies:      Allergies  Allergen Reactions   Kiwi Extract     Sulfa Antibiotics Other (See Comments)      Family History:      Family History  Problem Relation Age of Onset   Breast cancer Neg Hx        Social History:  reports that she quit smoking about 10 years ago. Her smoking use included cigarettes. She has never used smokeless tobacco. She reports that she does not use drugs. No history on file for alcohol use.   ROS:                           Physical Exam: There were no vitals taken for this visit.  Constitutional:  Alert and oriented, No acute distress. HEENT: [place] AT, moist mucus membranes.  Trachea midline, no masses.   Laboratory Data: Recent Labs       Lab Results  Component Value Date    WBC 7.3 12/10/2020    HGB 14.4 12/10/2020    HCT 42.2 12/10/2020    MCV 89.6 12/10/2020    PLT 183 12/10/2020        Recent Labs       Lab Results  Component Value Date    CREATININE 0.93 02/06/2022        Recent Labs  No results found for:  "PSA"     Recent Labs  No results found for: "TESTOSTERONE"     Recent Labs  No results found for: "HGBA1C"     Urinalysis Labs (Brief)          Component Value Date/Time    COLORURINE YELLOW (A) 01/27/2022 1144    APPEARANCEUR HAZY (A) 01/27/2022 1144    LABSPEC 1.014 01/27/2022 1144    PHURINE 5.0 01/27/2022 1144    GLUCOSEU NEGATIVE 01/27/2022 1144    HGBUR NEGATIVE 01/27/2022 1144    BILIRUBINUR NEGATIVE 01/27/2022 1144    KETONESUR NEGATIVE 01/27/2022 1144    PROTEINUR NEGATIVE 01/27/2022 1144    NITRITE NEGATIVE 01/27/2022 1144    LEUKOCYTESUR NEGATIVE 01/27/2022 1144        Pertinent Imaging:     Assessment & Plan: I went over removal of InterStim in detail.  I will get an AP and lateral view of the sacrum to make certain there is no fracture or other issues.  Pros cons risk discussed.  Postoperative course discussed.  I stressed retained tip and not being able to have an MRI.  We will proceed accordingly.  She will try Myrbetriq 5 mg samples and prescription.  Botox  is a future option for her

## 2023-02-25 MED ORDER — SODIUM CHLORIDE 0.9 % IV SOLN: INTRAVENOUS | Status: DC

## 2023-02-25 MED ORDER — CEFAZOLIN SODIUM-DEXTROSE 2-4 GM/100ML-% IV SOLN
2.0000 g | INTRAVENOUS | Status: AC
  Administered 2023-02-26: 2 g via INTRAVENOUS

## 2023-02-25 MED ORDER — ORAL CARE MOUTH RINSE: 15.0000 mL | Freq: Once | OROMUCOSAL | Status: AC

## 2023-02-25 MED ORDER — CHLORHEXIDINE GLUCONATE 0.12 % MT SOLN
15.0000 mL | Freq: Once | OROMUCOSAL | Status: AC
  Administered 2023-02-26: 15 mL via OROMUCOSAL

## 2023-02-25 MED ORDER — FAMOTIDINE 20 MG PO TABS
20.0000 mg | ORAL_TABLET | Freq: Once | ORAL | Status: AC
  Administered 2023-02-26: 20 mg via ORAL

## 2023-02-26 ENCOUNTER — Ambulatory Visit: Payer: Medicare Other | Admitting: Urgent Care

## 2023-02-26 ENCOUNTER — Ambulatory Visit: Payer: Medicare Other

## 2023-02-26 ENCOUNTER — Encounter
Admission: RE | Disposition: A | Discharge status: Home / Self Care | Payer: Self-pay | Source: Home / Self Care | Attending: Urology

## 2023-02-26 ENCOUNTER — Other Ambulatory Visit: Payer: Self-pay

## 2023-02-26 ENCOUNTER — Encounter: Payer: Self-pay | Admitting: Urology

## 2023-02-26 ENCOUNTER — Ambulatory Visit
Admission: RE | Admit: 2023-02-26 | Discharge: 2023-02-26 | Disposition: A | Discharge status: Home / Self Care | Payer: Medicare Other | Attending: Urology | Admitting: Urology

## 2023-02-26 DIAGNOSIS — N3941 Urge incontinence: Secondary | ICD-10-CM | POA: Insufficient documentation

## 2023-02-26 DIAGNOSIS — E119 Type 2 diabetes mellitus without complications: Secondary | ICD-10-CM | POA: Diagnosis not present

## 2023-02-26 DIAGNOSIS — R32 Unspecified urinary incontinence: Secondary | ICD-10-CM

## 2023-02-26 DIAGNOSIS — T83110A Breakdown (mechanical) of urinary electronic stimulator device, initial encounter: Secondary | ICD-10-CM | POA: Insufficient documentation

## 2023-02-26 DIAGNOSIS — Y733 Surgical instruments, materials and gastroenterology and urology devices (including sutures) associated with adverse incidents: Secondary | ICD-10-CM | POA: Insufficient documentation

## 2023-02-26 DIAGNOSIS — Z952 Presence of prosthetic heart valve: Secondary | ICD-10-CM | POA: Diagnosis not present

## 2023-02-26 DIAGNOSIS — I10 Essential (primary) hypertension: Secondary | ICD-10-CM | POA: Diagnosis not present

## 2023-02-26 DIAGNOSIS — Z87891 Personal history of nicotine dependence: Secondary | ICD-10-CM | POA: Insufficient documentation

## 2023-02-26 DIAGNOSIS — E785 Hyperlipidemia, unspecified: Secondary | ICD-10-CM | POA: Insufficient documentation

## 2023-02-26 DIAGNOSIS — I2510 Atherosclerotic heart disease of native coronary artery without angina pectoris: Secondary | ICD-10-CM | POA: Diagnosis not present

## 2023-02-26 HISTORY — DX: Cataract extraction status, left eye: Z98.42

## 2023-02-26 HISTORY — DX: Other intervertebral disc degeneration, lumbar region without mention of lumbar back pain or lower extremity pain: M51.369

## 2023-02-26 HISTORY — DX: Other intervertebral disc degeneration, lumbar region: M51.36

## 2023-02-26 HISTORY — DX: Atherosclerosis of aorta: I70.0

## 2023-02-26 HISTORY — DX: Cataract extraction status, right eye: Z98.41

## 2023-02-26 HISTORY — PX: INTERSTIM IMPLANT REMOVAL: SHX5131

## 2023-02-26 LAB — GLUCOSE, CAPILLARY
Glucose-Capillary: 169 mg/dL — ABNORMAL HIGH (ref 70–99)
Glucose-Capillary: 130 mg/dL — ABNORMAL HIGH (ref 70–99)

## 2023-02-26 SURGERY — REMOVAL, NEUROSTIMULATOR, SACRAL
Anesthesia: General

## 2023-02-26 MED ORDER — CEFAZOLIN SODIUM-DEXTROSE 2-4 GM/100ML-% IV SOLN
INTRAVENOUS | Status: AC
  Filled 2023-02-26: qty 100

## 2023-02-26 MED ORDER — FENTANYL CITRATE (PF) 100 MCG/2ML IJ SOLN
INTRAMUSCULAR | Status: AC
  Filled 2023-02-26: qty 2

## 2023-02-26 MED ORDER — SUCCINYLCHOLINE CHLORIDE 200 MG/10ML IV SOSY
PREFILLED_SYRINGE | INTRAVENOUS | Status: AC
  Filled 2023-02-26: qty 10

## 2023-02-26 MED ORDER — MIDAZOLAM HCL 2 MG/2ML IJ SOLN
INTRAMUSCULAR | Status: AC
  Filled 2023-02-26: qty 2

## 2023-02-26 MED ORDER — CHLORHEXIDINE GLUCONATE 0.12 % MT SOLN
OROMUCOSAL | Status: AC
  Filled 2023-02-26: qty 15

## 2023-02-26 MED ORDER — BUPIVACAINE-EPINEPHRINE 0.5% -1:200000 IJ SOLN
INTRAMUSCULAR | Status: DC | PRN
  Administered 2023-02-26: 13 mL

## 2023-02-26 MED ORDER — ONDANSETRON HCL 4 MG/2ML IJ SOLN
INTRAMUSCULAR | Status: AC
  Filled 2023-02-26: qty 2

## 2023-02-26 MED ORDER — PROPOFOL 10 MG/ML IV BOLUS
INTRAVENOUS | Status: DC | PRN
  Administered 2023-02-26: 130 mg via INTRAVENOUS

## 2023-02-26 MED ORDER — DEXAMETHASONE SODIUM PHOSPHATE 10 MG/ML IJ SOLN
INTRAMUSCULAR | Status: DC | PRN
  Administered 2023-02-26: 5 mg via INTRAVENOUS

## 2023-02-26 MED ORDER — MIDAZOLAM HCL 2 MG/2ML IJ SOLN
INTRAMUSCULAR | Status: DC | PRN
  Administered 2023-02-26 (×2): 1 mg via INTRAVENOUS

## 2023-02-26 MED ORDER — PHENYLEPHRINE 80 MCG/ML (10ML) SYRINGE FOR IV PUSH (FOR BLOOD PRESSURE SUPPORT)
PREFILLED_SYRINGE | INTRAVENOUS | Status: DC | PRN
  Administered 2023-02-26: 160 ug via INTRAVENOUS
  Administered 2023-02-26 (×2): 80 ug via INTRAVENOUS
  Administered 2023-02-26: 160 ug via INTRAVENOUS

## 2023-02-26 MED ORDER — FENTANYL CITRATE (PF) 100 MCG/2ML IJ SOLN
INTRAMUSCULAR | Status: DC | PRN
  Administered 2023-02-26 (×2): 50 ug via INTRAVENOUS

## 2023-02-26 MED ORDER — SUCCINYLCHOLINE CHLORIDE 200 MG/10ML IV SOSY
PREFILLED_SYRINGE | INTRAVENOUS | Status: DC | PRN
  Administered 2023-02-26: 100 mg via INTRAVENOUS

## 2023-02-26 MED ORDER — DEXAMETHASONE SODIUM PHOSPHATE 10 MG/ML IJ SOLN
INTRAMUSCULAR | Status: AC
  Filled 2023-02-26: qty 1

## 2023-02-26 MED ORDER — LIDOCAINE HCL (PF) 2 % IJ SOLN
INTRAMUSCULAR | Status: AC
  Filled 2023-02-26: qty 5

## 2023-02-26 MED ORDER — ONDANSETRON HCL 4 MG/2ML IJ SOLN
INTRAMUSCULAR | Status: DC | PRN
  Administered 2023-02-26: 4 mg via INTRAVENOUS

## 2023-02-26 MED ORDER — LIDOCAINE HCL (CARDIAC) PF 100 MG/5ML IV SOSY
PREFILLED_SYRINGE | INTRAVENOUS | Status: DC | PRN
  Administered 2023-02-26: 50 mg via INTRAVENOUS

## 2023-02-26 MED ORDER — LIDOCAINE-EPINEPHRINE (PF) 1 %-1:200000 IJ SOLN
INTRAMUSCULAR | Status: AC
  Filled 2023-02-26: qty 30

## 2023-02-26 MED ORDER — FAMOTIDINE 20 MG PO TABS
ORAL_TABLET | ORAL | Status: AC
  Filled 2023-02-26: qty 1

## 2023-02-26 MED ORDER — OXYCODONE HCL 5 MG PO TABS: 5.0000 mg | ORAL_TABLET | Freq: Once | ORAL | Status: DC | PRN

## 2023-02-26 MED ORDER — BUPIVACAINE-EPINEPHRINE (PF) 0.5% -1:200000 IJ SOLN
INTRAMUSCULAR | Status: AC
  Filled 2023-02-26: qty 30

## 2023-02-26 MED ORDER — HYDROCODONE-ACETAMINOPHEN 5-325 MG PO TABS: 1.0000 | ORAL_TABLET | Freq: Four times a day (QID) | ORAL | 0 refills | Status: DC | PRN

## 2023-02-26 MED ORDER — OXYCODONE HCL 5 MG/5ML PO SOLN: 5.0000 mg | Freq: Once | ORAL | Status: DC | PRN

## 2023-02-26 MED ORDER — FENTANYL CITRATE (PF) 100 MCG/2ML IJ SOLN: 25.0000 ug | INTRAMUSCULAR | Status: DC | PRN

## 2023-02-26 SURGICAL SUPPLY — 37 items
ADH SKN CLS APL DERMABOND .7 (GAUZE/BANDAGES/DRESSINGS) ×1
APL PRP STRL LF DISP 70% ISPRP (MISCELLANEOUS) ×1
BLADE SURG 15 STRL LF DISP TIS (BLADE) ×1 IMPLANT
BLADE SURG 15 STRL SS (BLADE) ×1
CHLORAPREP W/TINT 26 (MISCELLANEOUS) ×1 IMPLANT
COVER MAYO STAND REUSABLE (DRAPES) ×1 IMPLANT
DERMABOND ADVANCED .7 DNX12 (GAUZE/BANDAGES/DRESSINGS) ×1 IMPLANT
DRAPE C-ARM 42X72 X-RAY (DRAPES) ×2 IMPLANT
DRAPE C-ARMOR (DRAPES) ×1 IMPLANT
DRAPE FLUOR MINI C-ARM 54X84 (DRAPES) ×1 IMPLANT
DRAPE INCISE 23X17 STRL (DRAPES) ×1 IMPLANT
DRAPE INCISE IOBAN 23X17 STRL (DRAPES) ×1 IMPLANT
DRAPE LAPAROSCOPIC ABDOMINAL (DRAPES) ×1 IMPLANT
DRAPE LAPAROTOMY TRNSV 106X77 (MISCELLANEOUS) IMPLANT
DRSG TEGADERM 4X4.75 (GAUZE/BANDAGES/DRESSINGS) IMPLANT
DRSG TELFA 3X8 NADH STRL (GAUZE/BANDAGES/DRESSINGS) ×1 IMPLANT
ELECT REM PT RETURN 9FT ADLT (ELECTROSURGICAL)
ELECTRODE REM PT RTRN 9FT ADLT (ELECTROSURGICAL) IMPLANT
GAUZE 4X4 16PLY ~~LOC~~+RFID DBL (SPONGE) ×1 IMPLANT
GLOVE BIO SURGEON STRL SZ7.5 (GLOVE) ×1 IMPLANT
GOWN STRL REUS W/ TWL XL LVL3 (GOWN DISPOSABLE) ×1 IMPLANT
GOWN STRL REUS W/TWL XL LVL3 (GOWN DISPOSABLE) ×1
KIT TURNOVER CYSTO (KITS) ×1 IMPLANT
MANIFOLD NEPTUNE II (INSTRUMENTS) ×1 IMPLANT
NDL HYPO 22X1.5 SAFETY MO (MISCELLANEOUS) IMPLANT
NEEDLE HYPO 22X1.5 SAFETY MO (MISCELLANEOUS) IMPLANT
NS IRRIG 500ML POUR BTL (IV SOLUTION) ×1 IMPLANT
PACK BASIN MINOR ARMC (MISCELLANEOUS) ×1 IMPLANT
STAPLER SKIN PROX 35W (STAPLE) IMPLANT
SUT VIC AB 3-0 SH 27 (SUTURE)
SUT VIC AB 3-0 SH 27X BRD (SUTURE) IMPLANT
SUT VIC AB 4-0 PS2 18 (SUTURE) ×1 IMPLANT
SYR BULB IRRIG 60ML STRL (SYRINGE) ×1 IMPLANT
SYR CONTROL 10ML LL (SYRINGE) IMPLANT
TRAP FLUID SMOKE EVACUATOR (MISCELLANEOUS) ×1 IMPLANT
WATER STERILE IRR 1000ML POUR (IV SOLUTION) ×1 IMPLANT
WATER STERILE IRR 500ML POUR (IV SOLUTION) ×1 IMPLANT

## 2023-02-26 NOTE — Discharge Instructions (Addendum)
I have reviewed discharge instructions in detail with the patient. They will follow-up with me or their physician as scheduled. My nurse will also be calling the patients as per protocol.   Normal activity tomorrow   Pain rx or advil or tylenol  Remove dressing saturdayAMBULATORY SURGERY  DISCHARGE INSTRUCTIONS   The drugs that you were given will stay in your system until tomorrow so for the next 24 hours you should not:  Drive an automobile Make any legal decisions Drink any alcoholic beverage   You may resume regular meals tomorrow.  Today it is better to start with liquids and gradually work up to solid foods.  You may eat anything you prefer, but it is better to start with liquids, then soup and crackers, and gradually work up to solid foods.   Please notify your doctor immediately if you have any unusual bleeding, trouble breathing, redness and pain at the surgery site, drainage, fever, or pain not relieved by medication.    Additional Instructions:        Please contact your physician with any problems or Same Day Surgery at [phone], Monday through Friday 6 am to 4 pm, or [place] at [facility] number at [phone].

## 2023-02-26 NOTE — Anesthesia Procedure Notes (Signed)
Procedure Name: Intubation Date/Time: 02/26/2023 1:04 PM  Performed by: Omer Jack, CRNAPre-anesthesia Checklist: Patient identified, Patient being monitored, Timeout performed, Emergency Drugs available and Suction available Patient Re-evaluated:Patient Re-evaluated prior to induction Oxygen Delivery Method: Circle system utilized Preoxygenation: Pre-oxygenation with 100% oxygen Induction Type: IV induction Ventilation: Mask ventilation without difficulty Laryngoscope Size: 3 and McGraph Grade View: Grade I Tube type: Oral Tube size: 7.0 mm Number of attempts: 1 Airway Equipment and Method: Stylet Placement Confirmation: ETT inserted through vocal cords under direct vision, positive ETCO2 and breath sounds checked- equal and bilateral Secured at: 21 cm Tube secured with: Tape Dental Injury: Teeth and Oropharynx as per pre-operative assessment

## 2023-02-26 NOTE — Progress Notes (Signed)
   02/26/23 1300  Spiritual Encounters  Type of Visit Initial  Care provided to: Patient  Referral source Chaplain assessment  Reason for visit Routine spiritual support  OnCall Visit No  Spiritual Framework  Presenting Themes Courage hope and growth  Patient Stress Factors None identified  Family Stress Factors None identified  Interventions  Spiritual Care Interventions Made Compassionate presence;Established relationship of care and support  Intervention Outcomes  Outcomes Awareness of support  Spiritual Care Plan  Spiritual Care Issues Still Outstanding No further spiritual care needs at this time (see row info)   Chaplain spiritual support services remain available as the need arises.

## 2023-02-26 NOTE — Anesthesia Preprocedure Evaluation (Signed)
Anesthesia Evaluation  Patient identified by MRN, date of birth, ID band Patient awake    Reviewed: Allergy & Precautions, NPO status , Patient's Chart, lab work & pertinent test results  History of Anesthesia Complications (+) PONV and history of anesthetic complications  Airway Mallampati: III  TM Distance: <3 FB Neck ROM: full    Dental  (+) Chipped   Pulmonary neg pulmonary ROS, neg shortness of breath, former smoker   Pulmonary exam normal        Cardiovascular Exercise Tolerance: Good hypertension, (-) angina + CAD and + Cardiac Stents  Normal cardiovascular exam     Neuro/Psych  PSYCHIATRIC DISORDERS       Neuromuscular disease    GI/Hepatic negative GI ROS, Neg liver ROS,,,  Endo/Other  diabetes, Type 2    Renal/GU      Musculoskeletal   Abdominal   Peds  Hematology negative hematology ROS (+)   Anesthesia Other Findings Past Medical History: No date: Anxiety No date: Aortic atherosclerosis (HCC) No date: Arthritis 04/14/2020: CAD (coronary artery disease)     Comment:  a.) R/LHC 04/14/2020: mod CAD with sev obstructive               disease in large dom RCA; staged PCI planned. b.) PCI               04/28/2020: DES (unknown type) placed to dRCA. No date: DDD (degenerative disc disease), lumbar     Comment:  a.) s/p L3-L5 decompression 02/06/2022 No date: Depression 01/06/2019: Diastolic dysfunction     Comment:  a.) TTE 01/06/2019: EF >55%; triv panvalvular               regurgitation; G1DD. b.) TTE 01/26/2020: EF > 55%; mod               LVH; triv MR/TR/PR; G1DD; c.) TTE 06/22/2022: EF >55%,               mild LVH, triv MR/TR, biopros AoV well seated with normal              function (MPG 16), no LVOT obstruction 2016: Diverticulosis No date: External hemorrhoid No date: Family history of adverse reaction to anesthesia     Comment:  a.) postoperative delirium in 1st degree relative                (sister) 2017: Hereditary lymphedema of legs No date: History of angioedema No date: History of bilateral cataract extraction No date: History of kidney stones No date: Hyperlipidemia No date: Hypertension No date: Lumbar spinal stenosis 01/06/2019: Non-rheumatic aortic sclerosis     Comment:  a.) TTE 01/06/2019: EF >55%; mod AS (MPG 34.1 mmHg). b.)              TTE 01/26/2020: EF >55%; sev AS (MPG 47.1 mmHg). c.) RHC               04/14/2020: norm CI, PVR, RAP; PCWP 10. d.) s/p TAVR               05/11/2020. e.) TTE 06/01/2020: EF >55%; mild LVH; MPG 9               mmHg. f.) TTE 06/16/2021: EF >55%; mild LVH; GLS -11.6%;               MPG 24 mmHg. No date: Pneumonia 2001: PONV (postoperative nausea and vomiting)     Comment:  a.) experienced with procedure for urolithiasis  No date: Psoriasis 05/11/2020: S/P TAVR (transcatheter aortic valve replacement)     Comment:  a.) 23 mm Edwards Sapien S3 bioprosthetic valve 2013: Status post implantation of urinary electronic stimulator device     Comment:  a.) placed in [place] back in 2013 after "bladder was               damaged from attempts to break up kidney stones" per               patient report; b.) device failed/stopped working just               before [employer] (~ 2020); c.) plans for removal               02/2023 No date: T2DM (type 2 diabetes mellitus) (HCC) No date: Tubular adenoma of colon No date: Varicose veins of both legs with edema  Past Surgical History: 2013: BLADDER STIMULATOR PLACEMENT     Comment:  Placed in [place] 2014: CATARACT EXTRACTION W/ INTRAOCULAR LENS IMPLANT; Bilateral No date: CESAREAN SECTION 06/26/2018: COLONOSCOPY WITH PROPOFOL; N/A     Comment:  Procedure: COLONOSCOPY WITH PROPOFOL;  Surgeon: Toledo,               Boykin Nearing, MD;  Location: [facility];  Service:               Gastroenterology;  Laterality: N/A; 10/27/2016: JOINT REPLACEMENT; Right 2014: JOINT REPLACEMENT;  Left 11/29/2016: KNEE CLOSED REDUCTION; Right     Comment:  Procedure: CLOSED MANIPULATION KNEE;  Surgeon: Erin Sons, MD;  Location: [facility];  Service: Orthopedics;              Laterality: Right; 02/06/2022: LUMBAR LAMINECTOMY/DECOMPRESSION MICRODISCECTOMY; N/A     Comment:  Procedure: L3-5 DECOMPRESSION;  Surgeon: Venetia Night, MD;  Location: [facility] OR[facility];  Service: Neurosurgery;              Laterality: N/A; 2018: PARS PLANA VITRECTOMY; Right     Comment:  Procedure: VITRECTOMY, MECHANICAL, PARS PLANA APPROACH;               WITH REMOVAL PRERETINAL CELLULAR MEMBRANE(EG, MACULAR               PUCKER); Surgeon: Melvyn Novas, MD; Location: [facility]; Service: Ophthalmology 04/28/2020: PERCUTANEOUS CORONARY STENT INTERVENTION (PCI-S)     Comment:  Procedure: PERCUTANEOUS CORONARY STENT INTERVENTION               (PCI-S); Location: [facility] 04/14/2020: RIGHT/LEFT HEART CATH AND CORONARY ANGIOGRAPHY; Right     Comment:  Procedure: RIGHT/LEFT HEART CATH AND CORONARY               ANGIOGRAPHY; Location: [facility] 05/11/2020: TRANSCATHETER AORTIC VALVE REPLACEMENT, TRANSFEMORAL; N/A     Comment:  ProcedureL TRANSCATHETER AORTIC VALVE REPLACEMENT,               TRANSFEMORAL; Location: [facility]; Surgeon: Celso Amy,              MD  BMI    Body Mass Index: 36.62 kg/m      Reproductive/Obstetrics negative OB ROS  Anesthesia Physical Anesthesia Plan  ASA: 3  Anesthesia Plan: General ETT   Post-op Pain Management:    Induction: Intravenous  PONV Risk Score and Plan: Ondansetron, Dexamethasone, Midazolam and Treatment may vary due to age or medical condition  Airway Management Planned: Oral ETT  Additional Equipment:   Intra-op Plan:   Post-operative Plan: Extubation in OR  Informed Consent: I have reviewed the patients History and Physical, chart, labs and  discussed the procedure including the risks, benefits and alternatives for the proposed anesthesia with the patient or authorized representative who has indicated his/her understanding and acceptance.     Dental Advisory Given  Plan Discussed with: Anesthesiologist, CRNA and Surgeon  Anesthesia Plan Comments: (Patient consented for risks of anesthesia including but not limited to:  - adverse reactions to medications - damage to eyes, teeth, lips or other oral mucosa - nerve damage due to positioning  - sore throat or hoarseness - Damage to heart, brain, nerves, lungs, other parts of body or loss of life  Patient voiced understanding.)       Anesthesia Quick Evaluation

## 2023-02-26 NOTE — Op Note (Signed)
Preoperative diagnosis: Refractory urgency incontinence and malfunctioning InterStim Postoperative diagnoses refractory urgency incontinence and malfunctioning InterStim Surgery: Removal of InterStim in total and fluoroscopy Surgeon: Dr. Lorin Picket Carrie Usery  The patient has the above diagnosis and consented the above procedure.  Extra care was taken to position the patient.  I could see the device nicely under C arm.  IPG was low in the right buttock.  Preoperative antibiotics given  I instilled 10 cc of lidocaine epinephrine mixture and reopen the right buttock incision approximate 4 and half centimeters in length.  I dissected down through the thin subcutaneous tissue entering the pseudocapsule.  IPG was easily removed.  Hemostat was placed across the lead and the lead was cut.  Using soft tissue landmarks, the site of the midline incision and pulling on the lead and fluoroscopy I marked the midline incision just to the right of the midline.  It overlapped with the previous incision.  It eventually was 2 and half to 3 cm in length because the lead was so deep.  5 cc of lidocaine epinephrine mixture utilized.  Skin incision made and I dissected down through soft tissue.  The lead was very deep.  I did find it and deliver it from lateral to medial.  Fluoroscopy was then used in a lateral position.  I dissected down using Metzenbaum scissors and finger dissection almost to the bony table.  The lead outer coating had fractured.  I was down almost to the first tine.  Under fluoroscopic guidance in my usual technique I removed the lead in total.  There was no bleeding.  I opened up the pseudocapsule of the IPG posteriorly.  I closed right buttock incision with running 3-0 Vicryl subcutaneous followed by 4-0 Monocryl subcuticular.  I closed the midline incision with running 3-0 Vicryl subcutaneous followed by interrupted 4-0 Vicryl.  Sterile dressing was applied.  The entire device was removed

## 2023-02-26 NOTE — Progress Notes (Signed)
Preop evaluation normal Questions answered and post op described

## 2023-02-26 NOTE — Transfer of Care (Signed)
Immediate Anesthesia Transfer of Care Note  Patient: Brandy Oneill  Procedure(s) Performed: REMOVAL OF Leane Platt IMPLANT  Patient Location: PACU  Anesthesia Type:General  Level of Consciousness: drowsy and patient cooperative  Airway & Oxygen Therapy: Patient Spontanous Breathing and Patient connected to face mask oxygen  Post-op Assessment: Report given to RN and Post -op Vital signs reviewed and stable  Post vital signs: Reviewed and stable  Last Vitals:  Vitals Value Taken Time  BP 126/60 02/26/23 1400  Temp    Pulse 89 02/26/23 1406  Resp 21 02/26/23 1406  SpO2 95 % 02/26/23 1406  Vitals shown include unvalidated device data.  Last Pain:  Vitals:   02/26/23 1047  PainSc: 0-No pain         Complications: No notable events documented.

## 2023-02-26 NOTE — Interval H&P Note (Signed)
History and Physical Interval Note:  02/26/2023 11:52 AM  Brandy Oneill  has presented today for surgery, with the diagnosis of Malfunctioning Interstim.  The various methods of treatment have been discussed with the patient and family. After consideration of risks, benefits and other options for treatment, the patient has consented to  Procedure(s): REMOVAL OF INTERSTIM IMPLANT (N/A) as a surgical intervention.  The patient's history has been reviewed, patient examined, no change in status, stable for surgery.  I have reviewed the patient's chart and labs.  Questions were answered to the patient's satisfaction.     Naika Noto A Kelley Polinsky

## 2023-02-27 ENCOUNTER — Telehealth: Payer: Self-pay

## 2023-02-27 ENCOUNTER — Encounter: Payer: Self-pay | Admitting: Urology

## 2023-02-27 NOTE — Telephone Encounter (Signed)
Called patient to follow up on how she is doing after he surgery. Patient states she is doing well just a little sore in her "back side." No fever or complications so far. Follow up appointment made but there were no openings until August 19th. Dr Sherron Monday do I need to double book patient to come in sooner or any other recommendations?

## 2023-02-27 NOTE — Anesthesia Postprocedure Evaluation (Signed)
Anesthesia Post Note  Patient: Brandy Oneill  Procedure(s) Performed: REMOVAL OF Leane Platt IMPLANT  Patient location during evaluation: PACU Anesthesia Type: General Level of consciousness: awake and alert Pain management: pain level controlled Vital Signs Assessment: post-procedure vital signs reviewed and stable Respiratory status: spontaneous breathing, nonlabored ventilation, respiratory function stable and patient connected to nasal cannula oxygen Cardiovascular status: blood pressure returned to baseline and stable Postop Assessment: no apparent nausea or vomiting Anesthetic complications: no   No notable events documented.   Last Vitals:  Vitals:   02/26/23 1430 02/26/23 1517  BP: 115/62 (!) 147/65  Pulse: 80 85  Resp: (!) 21 18  Temp:  36.7 C  SpO2: 94% 96%    Last Pain:  Vitals:   02/26/23 1517  TempSrc: Oral  PainSc:                  Cleda Mccreedy Myalynn Lingle

## 2023-03-01 ENCOUNTER — Encounter: Payer: Self-pay | Admitting: Urology

## 2023-03-19 ENCOUNTER — Encounter: Payer: Self-pay | Admitting: Urology

## 2023-03-19 MED ORDER — SOLIFENACIN SUCCINATE 5 MG PO TABS: 5.0000 mg | ORAL_TABLET | Freq: Every day | ORAL | 11 refills | Status: DC

## 2023-04-09 ENCOUNTER — Ambulatory Visit: Payer: Medicare Other | Admitting: Urology

## 2023-04-09 VITALS — BP 159/80 | HR 92 | Ht 63.5 in | Wt 214.0 lb

## 2023-04-09 DIAGNOSIS — R32 Unspecified urinary incontinence: Secondary | ICD-10-CM

## 2023-04-09 NOTE — Progress Notes (Signed)
04/09/2023 3:20 PM   Marius Ditch 11/28/1950 [id number]  Referring provider: Barbette Reichmann, MD [address] [facility] [place],  [place] [address]  Chief Complaint  Patient presents with   Post-op Follow-up    HPI: Reviewed note. I last saw the patient in 2020.  She saw our nurse practitioner in April 2024.  The patient noted that in September 2020 her symptoms were well-controlled.  She noted in the last 6 months her incontinence got a lot worse even though she was on oxybutynin twice a day sometimes augmented by 10 mg.  Then she said it did not work that well.  He was curious to wonder if the new device would work better.  She was given [place].  It was unclear if she has ever tried [employer].   Patient still has urge incontinence in spite of Gemtesa.  Clinically not infected.  She would like to have the device removed since twice she is wanted an MRI.  Again the device was quite medial on the right side.  Patient had InterStim removed February 26, 2023  Patient is doing a lot better on Vesicare.  Clinically not infected.  Incisions look very good   PMH: Past Medical History:  Diagnosis Date   Anxiety    Aortic atherosclerosis (HCC)    Arthritis    CAD (coronary artery disease) 04/14/2020   a.) R/LHC 04/14/2020: mod CAD with sev obstructive disease in large dom RCA; staged PCI planned. b.) PCI 04/28/2020: DES (unknown type) placed to dRCA.   DDD (degenerative disc disease), lumbar    a.) s/p L3-L5 decompression 02/06/2022   Depression    Diastolic dysfunction 01/06/2019   a.) TTE 01/06/2019: EF >55%; triv panvalvular regurgitation; G1DD. 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 b.) TTE 01/26/2020: EF >55%; sev AS (MPG 47.1 mmHg). c.) RHC 04/14/2020: norm CI, PVR, RAP; PCWP 10. d.) s/p TAVR 05/11/2020. e.) TTE 06/01/2020: EF >55%; mild LVH; MPG 9 mmHg. f.) TTE 06/16/2021: EF >55%; mild LVH; GLS -11.6%; MPG 24 mmHg.   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   a.) 23 mm Edwards Sapien S3 bioprosthetic valve   Status post implantation of urinary electronic stimulator device 2013   a.) placed in [place] back in 2013 after "bladder was damaged from attempts to break up kidney stones" per patient report; b.) device failed/stopped working just before [employer] (~ 2020); c.) plans for removal 02/2023   T2DM (type 2 diabetes mellitus) (HCC)    Tubular adenoma of colon    Varicose veins of both legs with edema     Surgical History: Past Surgical History:  Procedure Laterality Date   BLADDER STIMULATOR PLACEMENT  2013   Placed in [place]   CATARACT EXTRACTION W/ INTRAOCULAR LENS IMPLANT Bilateral 2014   CESAREAN SECTION     COLONOSCOPY WITH PROPOFOL N/A 06/26/2018   Procedure: COLONOSCOPY WITH PROPOFOL;  Surgeon: Toledo, Boykin Nearing, MD;  Location: [facility];  Service: Gastroenterology;  Laterality: N/A;   INTERSTIM IMPLANT REMOVAL N/A 02/26/2023   Procedure: REMOVAL OF INTERSTIM IMPLANT;  Surgeon: Alfredo Martinez, MD;  Location: [facility];  Service: Urology;  Laterality: N/A;   JOINT REPLACEMENT Right 10/27/2016   JOINT REPLACEMENT Left 2014   KNEE CLOSED REDUCTION Right 11/29/2016   Procedure:  CLOSED MANIPULATION KNEE;  Surgeon: Erin Sons, MD;  Location: [facility];  Service: Orthopedics;  Laterality: Right;   LUMBAR LAMINECTOMY/DECOMPRESSION MICRODISCECTOMY N/A 02/06/2022   Procedure: L3-5 DECOMPRESSION;  Surgeon: Venetia Night, MD;  Location: [facility];  Service: Neurosurgery;  Laterality: N/A;   PARS PLANA VITRECTOMY Right 2018   Procedure: VITRECTOMY, MECHANICAL, PARS PLANA APPROACH; WITH REMOVAL PRERETINAL CELLULAR MEMBRANE(EG, MACULAR PUCKER); Surgeon: Melvyn Novas, MD; [facility]: [facility]; Service: Ophthalmology   PERCUTANEOUS CORONARY STENT INTERVENTION (PCI-S)  04/28/2020   Procedure: PERCUTANEOUS CORONARY STENT INTERVENTION (PCI-S); Location: [facility]   RIGHT/LEFT HEART CATH AND CORONARY ANGIOGRAPHY Right 04/14/2020   Procedure: RIGHT/LEFT HEART CATH AND CORONARY ANGIOGRAPHY; Location: [facility]   TRANSCATHETER AORTIC VALVE REPLACEMENT, TRANSFEMORAL N/A 05/11/2020   ProcedureL TRANSCATHETER AORTIC VALVE REPLACEMENT, TRANSFEMORAL; Location: [facility]; Surgeon: Celso Amy, MD    Home Medications:  Allergies as of 04/09/2023       Reactions   Kiwi Extract    Sulfa Antibiotics Other (See Comments)        Medication List        Accurate as of April 09, 2023  3:20 PM. If you have any questions, ask your nurse or doctor.          STOP taking these medications    HYDROcodone-acetaminophen 5-325 MG tablet Commonly known as: Norco   metFORMIN 500 MG tablet Commonly known as: GLUCOPHAGE       TAKE these medications    calcium carbonate 750 MG chewable tablet Commonly known as: TUMS EX Chew 1 tablet by mouth as needed for heartburn.   cyanocobalamin 1000 MCG tablet Commonly known as: VITAMIN B12 Take 1,000 mcg by mouth daily.   EPIPEN IJ Inject as directed.   fexofenadine 180 MG tablet Commonly known as: ALLEGRA Take by mouth as needed.   fluticasone 50 MCG/ACT nasal spray Commonly known as: FLONASE as needed.   lisinopril 40  MG tablet Commonly known as: ZESTRIL Take 40 mg by mouth at bedtime.   multivitamin with minerals Tabs tablet Take 1 tablet by mouth daily. Centrum for Women   naproxen sodium 220 MG tablet Commonly known as: ALEVE Take 220 mg by mouth. 2 tablets   pravastatin 10 MG tablet Commonly known as: PRAVACHOL Take 10 mg by mouth every evening.   solifenacin 5 MG tablet Commonly known as: VESICARE Take 1 tablet (5 mg total) by mouth daily.   venlafaxine 37.5 MG tablet Commonly known as: EFFEXOR Take 37.5 mg by mouth daily.        Allergies:  Allergies  Allergen Reactions   Kiwi Extract    Sulfa Antibiotics Other (See Comments)    Family History: No family history on file.  Social History:  reports that she quit smoking about 10 years ago. Her smoking use included cigarettes. She has never used smokeless tobacco. She reports current alcohol use. She reports that she  does not use drugs.  ROS:                                        Physical Exam: BP (!) 159/80   Pulse 92   Ht 5' 3.5" (1.613 m)   Wt 97.1 kg   BMI 37.31 kg/m   Constitutional:  Alert and oriented, No acute distress. HEENT: [place] AT, moist mucus membranes.  Trachea midline, no masses.  Laboratory Data: Lab Results  Component Value Date   WBC 7.3 12/10/2020   HGB 14.4 12/10/2020   HCT 42.2 12/10/2020   MCV 89.6 12/10/2020   PLT 183 12/10/2020    Lab Results  Component Value Date   CREATININE 0.93 02/06/2022    No results found for: "PSA"  No results found for: "TESTOSTERONE"  No results found for: "HGBA1C"  Urinalysis    Component Value Date/Time   COLORURINE YELLOW (A) 01/27/2022 1144   APPEARANCEUR HAZY (A) 01/27/2022 1144   LABSPEC 1.014 01/27/2022 1144   PHURINE 5.0 01/27/2022 1144   GLUCOSEU NEGATIVE 01/27/2022 1144   HGBUR NEGATIVE 01/27/2022 1144   BILIRUBINUR NEGATIVE 01/27/2022 1144   KETONESUR NEGATIVE 01/27/2022 1144   PROTEINUR NEGATIVE 01/27/2022  1144   NITRITE NEGATIVE 01/27/2022 1144   LEUKOCYTESUR NEGATIVE 01/27/2022 1144    Pertinent Imaging:   Assessment & Plan: Reassess durability of Vesicare in 4 months.  Botox is a distant option  1. Urinary incontinence in female    No follow-ups on file.  Martina Sinner, MD  [facility] [address] [place], [place] [address] [phone]

## 2023-04-17 ENCOUNTER — Ambulatory Visit
Admission: RE | Admit: 2023-04-17 | Discharge: 2023-04-17 | Disposition: A | Discharge status: Home / Self Care | Payer: Medicare Other | Source: Ambulatory Visit | Attending: Internal Medicine | Admitting: Internal Medicine

## 2023-04-17 DIAGNOSIS — Z1231 Encounter for screening mammogram for malignant neoplasm of breast: Secondary | ICD-10-CM | POA: Diagnosis present

## 2023-05-01 ENCOUNTER — Ambulatory Visit: Payer: Medicare Other

## 2023-05-01 DIAGNOSIS — Z09 Encounter for follow-up examination after completed treatment for conditions other than malignant neoplasm: Secondary | ICD-10-CM | POA: Diagnosis present

## 2023-05-01 DIAGNOSIS — K640 First degree hemorrhoids: Secondary | ICD-10-CM | POA: Diagnosis not present

## 2023-05-01 DIAGNOSIS — Z86010 Personal history of colonic polyps: Secondary | ICD-10-CM | POA: Diagnosis not present

## 2023-05-01 DIAGNOSIS — K5730 Diverticulosis of large intestine without perforation or abscess without bleeding: Secondary | ICD-10-CM | POA: Diagnosis not present

## 2023-05-01 DIAGNOSIS — R194 Change in bowel habit: Secondary | ICD-10-CM | POA: Diagnosis not present

## 2023-06-03 ENCOUNTER — Encounter: Payer: Self-pay | Admitting: Urology

## 2023-06-04 MED ORDER — SOLIFENACIN SUCCINATE 10 MG PO TABS: 10.0000 mg | ORAL_TABLET | Freq: Every day | ORAL | 11 refills | Status: DC

## 2023-06-24 IMAGING — RF DG LUMBAR SPINE 2-3V
1 series · 1 of 1 positions shown · non-contrast
Comparison: CT 12/30/2021

CLINICAL DATA: Lumbar decompression

EXAM:
LUMBAR SPINE - 2-3 VIEW

[Series 1: dg x-ray · 0.20mm/px · 1 of 1 slices shown]
[im 1/1]
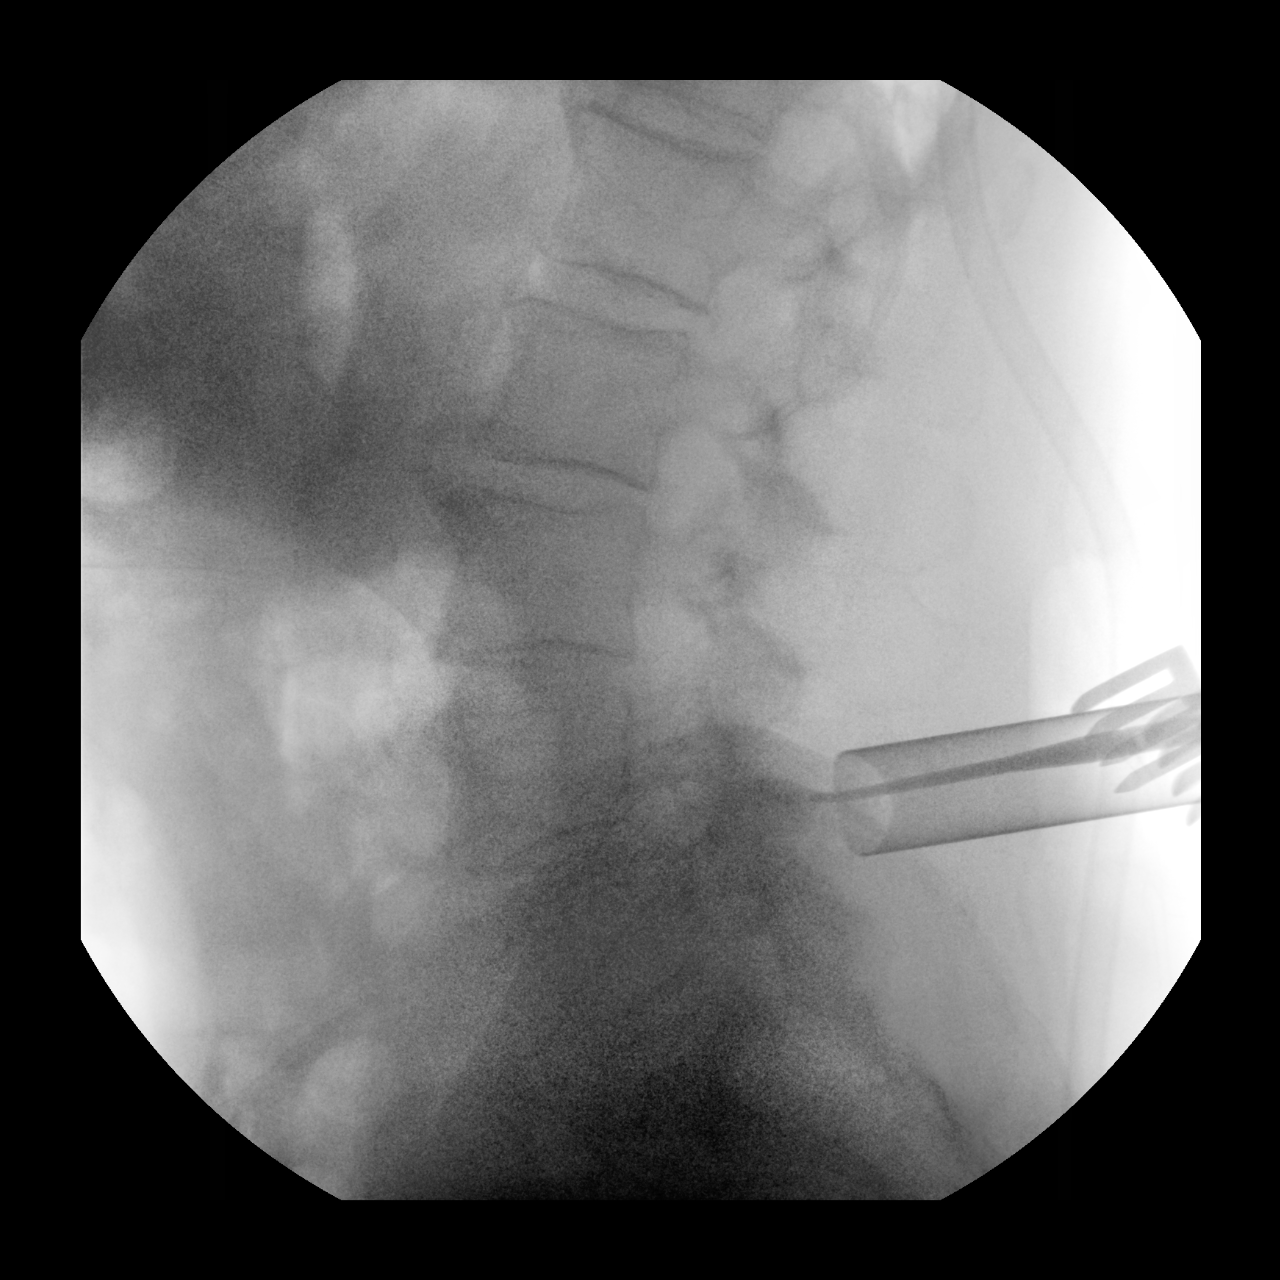

[1 of 1 positions shown; findings below may reference images not displayed]

FINDINGS: Single spot fluoroscopic image of the lumbar spine. Total
fluoroscopy time was 9 seconds, fluoroscopic dose of 8.64 mGy. The
image demonstrates surgical instruments posteriorly at the
approximate L4-L5 level.
IMPRESSION: Intraoperative fluoroscopic assistance provided during lumbar spine
surgery

## 2023-07-30 ENCOUNTER — Ambulatory Visit: Payer: Medicare Other | Admitting: Urology

## 2023-09-17 ENCOUNTER — Ambulatory Visit: Payer: Medicare Other | Admitting: Urology

## 2023-09-19 ENCOUNTER — Encounter: Payer: Self-pay | Admitting: Urology

## 2023-10-01 ENCOUNTER — Ambulatory Visit (INDEPENDENT_AMBULATORY_CARE_PROVIDER_SITE_OTHER): Payer: Medicare Other | Admitting: Urology

## 2023-10-01 ENCOUNTER — Telehealth: Payer: Self-pay

## 2023-10-01 VITALS — BP 168/69 | HR 108

## 2023-10-01 DIAGNOSIS — R32 Unspecified urinary incontinence: Secondary | ICD-10-CM

## 2023-10-01 DIAGNOSIS — N3941 Urge incontinence: Secondary | ICD-10-CM

## 2023-10-01 NOTE — Progress Notes (Signed)
 10/01/2023 1:41 PM   Brandy Oneill [birth year] [id number]  Referring provider: Antonio Baumgarten, MD [address] [facility] [place],  [place] [address]  Chief Complaint  Patient presents with   Follow-up    HPI: Reviewed note. I last saw the patient in 2020.  She saw our nurse practitioner in April 2024.  The patient noted that in September 2020 her symptoms were well-controlled.  She noted in the last 6 months her incontinence got a lot worse even though she was on oxybutynin  twice a day sometimes augmented by 10 mg.  Then she said it did not work that well.  He was curious to wonder if the new device would work better.  She was given Gemtesa .  She has failed Myrbetriq .  We did a lot of troubleshooting prior to removing the device placed in 2013 in [place]    Patient still has urge incontinence in spite of Gemtesa .  Clinically not infected.  She would like to have the device removed since twice she is wanted an MRI.  Again the device was quite medial on the right side.   Patient had InterStim removed February 26, 2023   Patient is doing a lot better on Vesicare .  Clinically not infected.  Incisions look very good   Reassess durability of Vesicare  in 4 months. Botox is a distant option   Today Patient increase Vesicare  to 10 mg but is really not helping.  Still has urge incontinence.  Still has frequency.  Clinically not infected   PMH: Past Medical History:  Diagnosis Date   Anxiety    Aortic atherosclerosis (HCC)    Arthritis    CAD (coronary artery disease) 04/14/2020   a.) R/LHC 04/14/2020: mod CAD with sev obstructive disease in large dom RCA; staged PCI planned. b.) PCI 04/28/2020: DES (unknown type) placed to dRCA.   DDD (degenerative disc disease), lumbar    a.) s/p L3-L5 decompression 02/06/2022   Depression    Diastolic dysfunction 01/06/2019   a.) TTE 01/06/2019: EF >55%; triv panvalvular regurgitation; G1DD. 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 b.) TTE 01/26/2020: EF >55%; sev AS (MPG 47.1 mmHg). c.) RHC 04/14/2020: norm CI, PVR, RAP; PCWP 10. d.) s/p TAVR 05/11/2020. e.) TTE 06/01/2020: EF >55%; mild LVH; MPG 9 mmHg. f.) TTE 06/16/2021: EF >55%; mild LVH; GLS -11.6%; MPG 24 mmHg.   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   a.) 23 mm Edwards Sapien S3 bioprosthetic valve   Status post implantation of urinary electronic stimulator device 2013   a.) placed in [place]  back in 2013 after "bladder was damaged from attempts to break up kidney stones" per patient report; b.) device failed/stopped working  just before [employer]-19 lockdown (~ 2020); c.) plans for removal 02/2023   T2DM (type 2 diabetes mellitus) (HCC)    Tubular adenoma of colon    Varicose veins of both legs with edema     Surgical History: Past Surgical History:  Procedure Laterality Date   BLADDER STIMULATOR PLACEMENT  2013   Placed in [place]    CATARACT EXTRACTION W/ INTRAOCULAR LENS IMPLANT Bilateral 2014   CESAREAN SECTION     COLONOSCOPY WITH PROPOFOL  N/A 06/26/2018   Procedure: COLONOSCOPY WITH PROPOFOL ;  Surgeon: Toledo, Alphonsus Jeans, MD;  Location: [facility];  Service: Gastroenterology;  Laterality: N/A;   INTERSTIM IMPLANT  REMOVAL N/A 02/26/2023   Procedure: REMOVAL OF INTERSTIM IMPLANT;  Surgeon: Erman Hayward, MD;  Location: [facility];  Service: Urology;  Laterality: N/A;   JOINT REPLACEMENT Right 10/27/2016   JOINT REPLACEMENT Left 2014   KNEE CLOSED REDUCTION Right 11/29/2016   Procedure: CLOSED MANIPULATION KNEE;  Surgeon: Josephus Nida, MD;  Location: [facility];  Service: Orthopedics;  Laterality: Right;   LUMBAR LAMINECTOMY/DECOMPRESSION MICRODISCECTOMY N/A 02/06/2022   Procedure: L3-5 DECOMPRESSION;  Surgeon: Jodeen Munch, MD;  Location: [facility];  Service: Neurosurgery;  Laterality: N/A;   PARS PLANA VITRECTOMY Right 2018   Procedure: VITRECTOMY, MECHANICAL, PARS PLANA APPROACH; WITH REMOVAL PRERETINAL CELLULAR MEMBRANE(EG, MACULAR PUCKER); Surgeon: Odette Benjamin, MD; Location: [facility]; Service: Ophthalmology   PERCUTANEOUS CORONARY STENT INTERVENTION (PCI-S)  04/28/2020   Procedure: PERCUTANEOUS CORONARY STENT INTERVENTION (PCI-S); Location: [facility]   RIGHT/LEFT HEART CATH AND CORONARY ANGIOGRAPHY Right 04/14/2020   Procedure: RIGHT/LEFT HEART CATH AND CORONARY ANGIOGRAPHY; Location: [facility]   TRANSCATHETER AORTIC VALVE REPLACEMENT, TRANSFEMORAL N/A 05/11/2020   ProcedureL TRANSCATHETER AORTIC VALVE REPLACEMENT, TRANSFEMORAL; Location: [facility]; Surgeon: Kyla Phlegm, MD    Home Medications:  Allergies as of 10/01/2023       Reactions   Kiwi Extract    Sulfa Antibiotics Other (See Comments)        Medication List        Accurate as of October 01, 2023  1:41 PM. If you have any questions, ask your nurse or doctor.          STOP taking these medications    naproxen sodium 220 MG tablet Commonly known as: ALEVE       TAKE these medications    calcium carbonate 750 MG chewable tablet Commonly known as: TUMS EX Chew 1 tablet by mouth as needed for heartburn.   cyanocobalamin 1000 MCG tablet Commonly known as: VITAMIN B12 Take 1,000 mcg by mouth daily.    EPIPEN  IJ Inject as directed.   fexofenadine 180 MG tablet Commonly known as: ALLEGRA Take by mouth as needed.   fluticasone  50 MCG/ACT nasal spray Commonly known as: FLONASE  as needed.   lisinopril  40 MG tablet Commonly known as: ZESTRIL  Take 40 mg by mouth at bedtime.   multivitamin with minerals Tabs tablet Take 1 tablet by mouth daily. Centrum for Women   pravastatin  10 MG tablet Commonly known as: PRAVACHOL  Take 10 mg by mouth every evening.   solifenacin  10 MG tablet Commonly known as: VESICARE  Take 1 tablet (10 mg total) by mouth daily.   venlafaxine  37.5 MG tablet Commonly known as: EFFEXOR  Take 37.5 mg by mouth daily.        Allergies:  Allergies  Allergen Reactions   Kiwi Extract    Sulfa Antibiotics Other (See Comments)    Family History: Family History  Problem Relation Age of Onset  Breast cancer Neg Hx     Social History:  reports that she quit smoking about 10 years ago. Her smoking use included cigarettes. She has never used smokeless tobacco. She reports current alcohol use. She reports that she does not use drugs.  ROS:                                        Physical Exam: There were no vitals taken for this visit.  Constitutional:  Alert and oriented, No acute distress. HEENT: [place] AT, moist mucus membranes.  Trachea midline, no masses.   Laboratory Data: Lab Results  Component Value Date   WBC 7.3 12/10/2020   HGB 14.4 12/10/2020   HCT 42.2 12/10/2020   MCV 89.6 12/10/2020   PLT 183 12/10/2020    Lab Results  Component Value Date   CREATININE 0.93 02/06/2022    No results found for: "PSA"  No results found for: "TESTOSTERONE"  No results found for: "HGBA1C"  Urinalysis    Component Value Date/Time   COLORURINE YELLOW (A) 01/27/2022 1144   APPEARANCEUR HAZY (A) 01/27/2022 1144   LABSPEC 1.014 01/27/2022 1144   PHURINE 5.0 01/27/2022 1144   GLUCOSEU NEGATIVE 01/27/2022 1144   HGBUR  NEGATIVE 01/27/2022 1144   BILIRUBINUR NEGATIVE 01/27/2022 1144   KETONESUR NEGATIVE 01/27/2022 1144   PROTEINUR NEGATIVE 01/27/2022 1144   NITRITE NEGATIVE 01/27/2022 1144   LEUKOCYTESUR NEGATIVE 01/27/2022 1144    Pertinent Imaging:   Assessment & Plan: Patient had called in and I explained to her that she should not have another InterStim.  First device was placed properly and it never worked.  I went over percutaneous tibial nerve stimulation of Botox with full templates.  Handout given.  She would like to first try PTNS.  Proceed accordingly.  1. Urinary incontinence in female (Primary)  - Urinalysis, Complete   No follow-ups on file.  Devorah Fonder, MD  [facility] [address], [address] [place], [place] [address] [phone]

## 2023-10-01 NOTE — Patient Instructions (Signed)

## 2023-10-01 NOTE — Telephone Encounter (Signed)
-----   Message from [facility] M sent at 10/01/2023  1:53 PM EST ----- Regarding: PTNS PA and schedule for PTNS, Please

## 2023-10-09 NOTE — Telephone Encounter (Signed)
 NO PA needed and no co pay needed.  Left message for patient to call back and schedule PTNS once a week for 12 weeks.

## 2023-10-10 NOTE — Telephone Encounter (Signed)
 Patient called back and [facility] scheduled appointments

## 2023-11-05 ENCOUNTER — Ambulatory Visit: Payer: Self-pay | Admitting: Urology

## 2023-11-06 NOTE — Progress Notes (Unsigned)
 PTNS  Session # 1/12  Health & Social Factors: No change  Caffeine: 2 Alcohol: 0 Daytime voids #per day: 8 Night-time voids #per night: 3 w/ soiled depends  Urgency: None Incontinence Episodes #per day: 6 Ankle used: Right Treatment Setting: 1 Feeling/ Response: Sensory  Comments: Patient tolerated.    Performed By: Michiel Cowboy, PA-C   Follow Up: One week for #1/12 PTNS

## 2023-11-08 ENCOUNTER — Ambulatory Visit (INDEPENDENT_AMBULATORY_CARE_PROVIDER_SITE_OTHER): Payer: Medicare Other | Admitting: Urology

## 2023-11-08 DIAGNOSIS — R32 Unspecified urinary incontinence: Secondary | ICD-10-CM | POA: Diagnosis not present

## 2023-11-15 ENCOUNTER — Ambulatory Visit (INDEPENDENT_AMBULATORY_CARE_PROVIDER_SITE_OTHER): Payer: Medicare Other | Admitting: Physician Assistant

## 2023-11-15 DIAGNOSIS — R32 Unspecified urinary incontinence: Secondary | ICD-10-CM | POA: Diagnosis not present

## 2023-11-15 NOTE — Progress Notes (Signed)
 PTNS  Session # 2  Health & Social Factors: no change Caffeine: 2 Alcohol: 0 Daytime voids #per day: 10 at least Night-time voids #per night: 3-4 Urgency: severe Incontinence Episodes #per day: 2, but probably more as patient wears a pull up and does not always know when she is leaking Ankle used: left Treatment Setting: 11 Feeling/ Response: sensory  Comments: n/a  Performed By: Jearld Pies RMA  Follow Up: 1 week

## 2023-11-15 NOTE — Patient Instructions (Signed)

## 2023-11-21 NOTE — Progress Notes (Unsigned)
 PTNS  Session # 3/12  Health & Social Factors: No Change Caffeine: 1 1/2  Alcohol: 0 Daytime voids #per day: 5-6 Night-time voids #per night: 5 Urgency: strong Incontinence Episodes #per day: wears a Diapers Ankle used: left Treatment Setting: 2 Feeling/ Response: Sensory  Comments: Patient tolerated.   Performed By: Michiel Cowboy, PA-C   Follow Up: 1 week for number 4 out of 12 PTNS

## 2023-11-22 ENCOUNTER — Ambulatory Visit (INDEPENDENT_AMBULATORY_CARE_PROVIDER_SITE_OTHER): Payer: Medicare Other | Admitting: Urology

## 2023-11-22 DIAGNOSIS — R32 Unspecified urinary incontinence: Secondary | ICD-10-CM | POA: Diagnosis not present

## 2023-11-27 NOTE — Progress Notes (Deleted)
 PTNS  Session # 4/12  Health & Social Factors: *** Caffeine: *** Alcohol: *** Daytime voids #per day: *** Night-time voids #per night: *** Urgency: *** Incontinence Episodes #per day: *** Ankle used: *** Treatment Setting: *** Feeling/ Response: *** Comments: ***  Performed By: ***  Follow Up: ***

## 2023-11-29 ENCOUNTER — Ambulatory Visit: Payer: Medicare Other | Admitting: Urology

## 2023-11-29 DIAGNOSIS — R32 Unspecified urinary incontinence: Secondary | ICD-10-CM

## 2023-12-06 ENCOUNTER — Ambulatory Visit: Payer: Medicare Other | Admitting: Physician Assistant

## 2023-12-13 ENCOUNTER — Ambulatory Visit (INDEPENDENT_AMBULATORY_CARE_PROVIDER_SITE_OTHER): Payer: Medicare Other | Admitting: Urology

## 2023-12-13 DIAGNOSIS — R32 Unspecified urinary incontinence: Secondary | ICD-10-CM

## 2023-12-13 NOTE — Progress Notes (Signed)
 PTNS  Session # 4/12  Health & Social Factors: No change Caffeine: 1 1/2 cups Alcohol: 0 Daytime voids #per day: 6 Night-time voids #per night: 2 Urgency: Mild Incontinence Episodes #per day: Continuous Ankle used: Left  Treatment Setting: 2 Feeling/ Response: Sensory  Comments: Patient tolerated   Performed By: Matilde Son, PA-C   Follow Up: One week for PTNS # 5

## 2023-12-20 ENCOUNTER — Ambulatory Visit (INDEPENDENT_AMBULATORY_CARE_PROVIDER_SITE_OTHER): Payer: Medicare Other | Admitting: Urology

## 2023-12-20 DIAGNOSIS — R32 Unspecified urinary incontinence: Secondary | ICD-10-CM

## 2023-12-20 NOTE — Progress Notes (Signed)
 PTNS  Session # 5/12  Health & Social Factors: No change Caffeine: 1 Alcohol: 0 Daytime voids #per day: 12 Night-time voids #per night: Unknown Urgency: none Incontinence Episodes #per day: 4 Ankle used: left Treatment Setting: 7 Feeling/ Response: Sensory Comments: Patient tolerated procedure well  Performed By: Matilde Son, PA-C   Follow Up: 1 week for number 6 out of 12 PTNS

## 2023-12-27 ENCOUNTER — Ambulatory Visit (INDEPENDENT_AMBULATORY_CARE_PROVIDER_SITE_OTHER): Payer: Medicare Other | Admitting: Urology

## 2023-12-27 DIAGNOSIS — R32 Unspecified urinary incontinence: Secondary | ICD-10-CM

## 2023-12-27 NOTE — Progress Notes (Signed)
 PTNS  Session # 6/12  Health & Social Factors: No change Caffeine: 2  Alcohol: 0  Daytime voids #per day: 2 every hour Night-time voids #per night: 2 Urgency: Strong Incontinence Episodes #per day: 10  Ankle used: Right Treatment Setting: 5 Feeling/ Response: Sensory  Comments: Patient tolerated  Performed By: Matilde Son, PA-C   Follow Up: 1 week for #7/12 PTNS

## 2024-01-03 ENCOUNTER — Ambulatory Visit (INDEPENDENT_AMBULATORY_CARE_PROVIDER_SITE_OTHER): Payer: Medicare Other | Admitting: Urology

## 2024-01-03 DIAGNOSIS — R32 Unspecified urinary incontinence: Secondary | ICD-10-CM

## 2024-01-03 NOTE — Progress Notes (Signed)
 PTNS  Session # 7/12  Health & Social Factors: No changes  Caffeine: 1 1/2  Alcohol: 0 Daytime voids #per day: 8 Night-time voids #per night: 1 Urgency: Mild  Incontinence Episodes #per day: 4 Ankle used: Right  Treatment Setting: 13 Feeling/ Response: Sensory Comments: Patient tolerated procedure well  Performed By: Matilde Son, PA-C   Follow Up: 1 week for number 8 out of 12 PTNS

## 2024-01-08 ENCOUNTER — Encounter (INDEPENDENT_AMBULATORY_CARE_PROVIDER_SITE_OTHER): Payer: Self-pay

## 2024-01-10 ENCOUNTER — Ambulatory Visit (INDEPENDENT_AMBULATORY_CARE_PROVIDER_SITE_OTHER): Payer: Medicare Other | Admitting: Urology

## 2024-01-10 DIAGNOSIS — N3941 Urge incontinence: Secondary | ICD-10-CM

## 2024-01-10 DIAGNOSIS — R32 Unspecified urinary incontinence: Secondary | ICD-10-CM

## 2024-01-10 NOTE — Patient Instructions (Signed)

## 2024-01-10 NOTE — Progress Notes (Signed)
 PTNS  Session # 8/12  Health & Social Factors: No changes Caffeine: 1 Alcohol: 0 Daytime voids #per day: 8 Night-time voids #per night: 2 Urgency: mild Incontinence Episodes #per day: Wears a diaper Ankle used: right  Treatment Setting: 3 Feeling/ Response: Sensory  Comments: Patient tolerated   Performed By: Matilde Son, PA-C   Follow Up: One week for # 9/12 PTNS

## 2024-01-17 ENCOUNTER — Ambulatory Visit: Payer: Medicare Other | Admitting: Urology

## 2024-01-18 ENCOUNTER — Ambulatory Visit (INDEPENDENT_AMBULATORY_CARE_PROVIDER_SITE_OTHER): Admitting: Urology

## 2024-01-18 DIAGNOSIS — R32 Unspecified urinary incontinence: Secondary | ICD-10-CM

## 2024-01-18 NOTE — Progress Notes (Signed)
 PTNS  Session # 9/12  Health & Social Factors: No change Caffeine: 1 Alcohol: 0 Daytime voids #per day: 8 Night-time voids #per night: 2 Urgency: mild Incontinence Episodes #per day: wears pads Ankle used: left Treatment Setting: 17 Feeling/ Response: Sensory  Comments: Patient tolerated   Performed By: Matilde Son, PA-C   Follow Up: One week for #10/12

## 2024-01-21 NOTE — Progress Notes (Signed)
 Referring Physician:  Antonio Baumgarten, MD [address] [facility] [place],  [place] [address]  Primary Physician:  Antonio Baumgarten, MD  History of Present Illness: 01/22/2024 [name] has a history of chronic venous insufficiency, HTN, CAD, DM, psoriasis, lymphedema, hyperlipidemia, cardiac stents, s/p aortic valve replacement.   She is s/p L3-L5 decompression by Dr. Mont Antis on 02/06/22. She did well after surgery. She had some persistent numbness from knee laterally into her foot since the surgery.   She has no current back or leg pain. She has constant numbness from her knees down into her toes x 3-4 weeks. No known injury. She feels like her legs are "tight" and weak. Symptoms are worse in the  morning, she has trouble walking. Symptoms improve as the day progresses.   No issues with hand dexterity. Feels like her balance is off.   She does not smoke.   Bowel/Bladder Dysfunction: history of bladder incontinence x years. No bowel issues.   Conservative measures:  Physical therapy:  has participated in the past (2023), none recently Multimodal medical therapy including regular antiinflammatories: none Injections:   09/26/2021: Right L5-S1 and left S1 TF ESI 06/13/2021: Right L5-S1 and left S1 TF ESI (moderate relief) 03/19/2020: Right L5-S1 and left S1 TF ESI (moderate to good relief) 12/31/2019: Right L5-S1 TF ESI (moderate to good relief) 10/31/2018: Bilateral L5-S1 TF ESI (complete relief) 10/03/2018: Bilateral L5-S1 TF ESI (moderate to good relief)   Past Surgery:  02/06/22: L3-5 Decompression  Brandy Oneill has no dexterity issues. Having balance issues.   The symptoms are causing a significant impact on the patient's life.   Review of Systems:  A 10 point review of systems is negative, except for the pertinent positives and negatives detailed in the HPI.  Past Medical History: Past Medical History:  Diagnosis Date   Anxiety     Aortic atherosclerosis (HCC)    Arthritis    CAD (coronary artery disease) 04/14/2020   a.) R/LHC 04/14/2020: mod CAD with sev obstructive disease in large dom RCA; staged PCI planned. b.) PCI 04/28/2020: DES (unknown type) placed to dRCA.   DDD (degenerative disc disease), lumbar    a.) s/p L3-L5 decompression 02/06/2022   Depression    Diastolic dysfunction 01/06/2019   a.) TTE 01/06/2019: EF >55%; triv panvalvular regurgitation; G1DD. 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 b.) TTE 01/26/2020: EF >55%; sev AS (MPG 47.1 mmHg). c.) RHC 04/14/2020: norm CI, PVR, RAP; PCWP 10. d.) s/p TAVR 05/11/2020. e.) TTE 06/01/2020: EF >55%; mild LVH; MPG 9 mmHg. f.) TTE 06/16/2021: EF >55%; mild LVH; GLS -11.6%; MPG 24 mmHg.   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  a.) 23 mm Edwards Sapien S3 bioprosthetic valve   Status post implantation of urinary electronic stimulator device 2013   a.) placed in [place]  back in 2013 after "bladder was damaged from attempts to break up kidney stones" per patient report; b.) device failed/stopped working just before [employer] (~ 2020); c.) plans for removal 02/2023   T2DM (type 2 diabetes mellitus) (HCC)    Tubular adenoma  of colon    Varicose veins of both legs with edema     Past Surgical History: Past Surgical History:  Procedure Laterality Date   BLADDER STIMULATOR PLACEMENT  2013   Placed in [place]    CATARACT EXTRACTION W/ INTRAOCULAR LENS IMPLANT Bilateral 2014   CESAREAN SECTION     COLONOSCOPY WITH PROPOFOL  N/A 06/26/2018   Procedure: COLONOSCOPY WITH PROPOFOL ;  Surgeon: Toledo, Alphonsus Jeans, MD;  Location: [facility];  Service: Gastroenterology;  Laterality: N/A;   INTERSTIM IMPLANT REMOVAL N/A 02/26/2023   Procedure: REMOVAL OF INTERSTIM IMPLANT;  Surgeon: Erman Hayward, MD;  Location: [facility];  Service: Urology;  Laterality: N/A;   JOINT REPLACEMENT Right 10/27/2016   JOINT REPLACEMENT Left 2014   KNEE CLOSED REDUCTION Right 11/29/2016   Procedure: CLOSED MANIPULATION KNEE;  Surgeon: Josephus Nida, MD;  Location: [facility];  Service: Orthopedics;  Laterality: Right;   LUMBAR LAMINECTOMY/DECOMPRESSION MICRODISCECTOMY N/A 02/06/2022   Procedure: L3-5 DECOMPRESSION;  Surgeon: Jodeen Munch, MD;  Location: [facility];  Service: Neurosurgery;  Laterality: N/A;   PARS PLANA VITRECTOMY Right 2018   Procedure: VITRECTOMY, MECHANICAL, PARS PLANA APPROACH; WITH REMOVAL PRERETINAL CELLULAR MEMBRANE(EG, MACULAR PUCKER); Surgeon: Odette Benjamin, MD; Location[facility]; Service: Ophthalmology   PERCUTANEOUS CORONARY STENT INTERVENTION (PCI-S)  04/28/2020   Procedure: PERCUTANEOUS CORONARY STENT INTERVENTION (PCI-S); Location: [facility]   RIGHT/LEFT HEART CATH AND CORONARY ANGIOGRAPHY Right 04/14/2020   Procedure: RIGHT/LEFT HEART CATH AND CORONARY ANGIOGRAPHY; Location: [facility]   TRANSCATHETER AORTIC VALVE REPLACEMENT, TRANSFEMORAL N/A 05/11/2020   ProcedureL TRANSCATHETER AORTIC VALVE REPLACEMENT, TRANSFEMORAL; Location: [facility]; Surgeon: Kyla Phlegm, MD    Allergies: Allergies as of 01/22/2024 - Review Complete 01/22/2024  Allergen Reaction Noted   Kiwi extract Anaphylaxis 02/04/2018    Sulfa antibiotics  02/24/2021    Medications: Outpatient Encounter Medications as of 01/22/2024  Medication Sig   b complex vitamins capsule Take 1 capsule by mouth daily.   fexofenadine (ALLEGRA) 180 MG tablet Take 180 mg by mouth as needed for allergies.   fluticasone  (FLONASE ) 50 MCG/ACT nasal spray as needed.   lisinopril  (PRINIVIL ,ZESTRIL ) 40 MG tablet Take 40 mg by mouth at bedtime.   pravastatin  (PRAVACHOL ) 10 MG tablet Take 10 mg by mouth every evening.   solifenacin  (VESICARE ) 10 MG tablet Take 1 tablet (10 mg total) by mouth daily.   venlafaxine  (EFFEXOR ) 37.5 MG tablet Take 37.5 mg by mouth daily.   calcium carbonate (TUMS EX) 750 MG chewable tablet Chew 1 tablet by mouth as needed for heartburn. (Patient not taking: Reported on 01/22/2024)   EPINEPHrine  (EPIPEN  IJ) Inject as directed. (Patient not taking: Reported on 01/22/2024)   Multiple Vitamin (MULTIVITAMIN WITH MINERALS) TABS tablet Take 1 tablet by mouth daily. Centrum for Women (Patient not taking: Reported on 01/22/2024)   No facility-administered encounter medications on file as of 01/22/2024.    Social History: Social History   Tobacco Use   Smoking status: Former    Current packs/day: 0.00    Types: Cigarettes    Quit date: 11/27/2012    Years since quitting: 11.1  Smokeless tobacco: Never  Substance Use Topics   Alcohol use: Yes    Comment: rare 1 drink per month   Drug use: No    Family Medical History: Family History  Problem Relation Age of Onset   Breast cancer Neg Hx     Physical Examination: Vitals:   01/22/24 1311  BP: 120/84    General: Patient is well developed, well nourished, calm, collected, and in no apparent distress. Attention to examination is appropriate.  Respiratory: Patient is breathing without any difficulty.   NEUROLOGICAL:     Awake, alert, oriented to person, place, and time.  Speech is clear and fluent. Fund of knowledge is appropriate.   Cranial Nerves: Pupils equal  round and reactive to light.  Facial tone is symmetric.    Well healed lumbar incision.   No abnormal lesions on exposed skin.   Strength: Side Biceps Triceps Deltoid Interossei Grip Wrist Ext. Wrist Flex.  R 5 5 5 5 5 5 5   L 5 5 5 5 5 5 5    Side Iliopsoas Quads Hamstring PF DF EHL  R 5 5 5 5 5 5   L 5 5 5 5 5 5    Reflexes are 2+ and symmetric at the biceps, brachioradialis, patella and achilles.   Hoffman's is absent.  Clonus is not present.   Bilateral upper and lower extremity sensation is intact to light touch, but subjectively diminished in bilateral legs.   Gait is slow unsteady gait. Takes small short steps. Using a cane.   Medical Decision Making  Imaging: No recent lumbar imaging.   Assessment and Plan: Brandy Oneill is s/p L3-L5 decompression by Dr. Mont Antis on 02/06/22. She did well after surgery. She had some persistent numbness from knee laterally into her foot since the surgery.   She has no current back or leg pain. She has 3-4 weeks history of constant numbness from her knees down into her toes. She is having trouble walking and feels like she may fall.   No recent lumbar imaging. No issues with hand dexterity. Her gait is very slow and unsteady.   Treatment options discussed with patient and following plan made:   - MRI of thoracic and lumbar spine to evaluate lower extremity numbness/unsteady gait.  - Prescription for rollator to help with gait and prevent falls.  - Depending on results of MRIs, may consider EMG/NCS of lower extremities and/or referral to neurology.  - May consider cervical MRI, but has no upper extremity symptoms.  - Will schedule follow up visit to review MRI results once I get them back.   I spent a total of 40 minutes in face-to-face and non-face-to-face activities related to this patient's care today including review of outside records, review of imaging, review of symptoms, physical exam, discussion of differential diagnosis, discussion  of treatment options, and documentation.   Thank you for involving me in the care of this patient.   Lucetta Russel PA-C Dept. of Neurosurgery

## 2024-01-22 ENCOUNTER — Encounter: Payer: Self-pay | Admitting: Orthopedic Surgery

## 2024-01-22 ENCOUNTER — Ambulatory Visit (INDEPENDENT_AMBULATORY_CARE_PROVIDER_SITE_OTHER): Admitting: Orthopedic Surgery

## 2024-01-22 VITALS — BP 120/84 | Ht 63.5 in | Wt 223.0 lb

## 2024-01-22 DIAGNOSIS — R2689 Other abnormalities of gait and mobility: Secondary | ICD-10-CM | POA: Diagnosis not present

## 2024-01-22 DIAGNOSIS — R200 Anesthesia of skin: Secondary | ICD-10-CM

## 2024-01-22 DIAGNOSIS — Z9889 Other specified postprocedural states: Secondary | ICD-10-CM

## 2024-01-22 MED ORDER — ROLLATOR ULTRA-LIGHT MISC: 0 refills | Status: AC

## 2024-01-22 NOTE — Patient Instructions (Signed)
 It was so nice to see you today. Thank you so much for coming in.   I want to get an MRI of your mid and lower back to look into things further. We will get this approved through your insurance and [facility] will call you to schedule the appointment. Ask about your patient responsibility. You do not need to pay this prior to getting MRI, they can bill you.   [facility] (building with the white pillars) is located off of [address]. The address is [address] [place], [place] [address].    After you have the MRIs, it takes 14-21 days for me to get the results back. Call me if you don't see anything after about a week.   Once I have them, we will call you to schedule a follow up visit with me to review them.   I printed a script for a rollator walker. You can try taking to [facility], but I'm not sure they will go through your insurance. May also try going to [facility]. They are at [address] in [place]. Their number is [phone].   Please do not hesitate to call if you have any questions or concerns. You can also message me in MyChart.   If you have not heard back about any of the above things in the next week, please call the office so we can help you get them scheduled.   Lucetta Russel PA-C [phone]     The physicians and staff at [facility] at [facility] are committed to providing excellent care. You may receive a survey asking for feedback about your experience at our office. We value you your feedback and appreciate you taking the time to to fill it out. The [facility] leadership team is also available to discuss your experience in person, feel free to contact us  [phone].

## 2024-01-24 ENCOUNTER — Ambulatory Visit (INDEPENDENT_AMBULATORY_CARE_PROVIDER_SITE_OTHER): Payer: Medicare Other | Admitting: Urology

## 2024-01-24 DIAGNOSIS — R32 Unspecified urinary incontinence: Secondary | ICD-10-CM | POA: Diagnosis not present

## 2024-01-24 NOTE — Progress Notes (Signed)
 PTNS  Session # 10/12  Health & Social Factors: No change Caffeine: 1 1/2 Alcohol: 0 Daytime voids #per day: 6 Night-time voids #per night: 1 Urgency: mild Incontinence Episodes #per day: 5 Ankle used: Left  Treatment Setting: 7 Feeling/ Response: Sensory  Comments: Patient tolerated   Performed By: Matilde Son, PA-C   Follow Up: One week for # 11/12 PTNS

## 2024-01-24 NOTE — Patient Instructions (Signed)

## 2024-01-30 NOTE — Progress Notes (Signed)
 Error

## 2024-01-31 ENCOUNTER — Ambulatory Visit (INDEPENDENT_AMBULATORY_CARE_PROVIDER_SITE_OTHER): Admitting: Urology

## 2024-01-31 DIAGNOSIS — R32 Unspecified urinary incontinence: Secondary | ICD-10-CM | POA: Diagnosis not present

## 2024-01-31 NOTE — Progress Notes (Signed)
 PTNS  Session # 11/12  Health & Social Factors: no change  Caffeine: 1.5 cups every morning  Alcohol: 0 Daytime voids #per day: 5 Night-time voids #per night: 1-2 Urgency: strong Incontinence Episodes #per day: pt can't say, 2-3 pads a day Ankle used: right Treatment Setting: 9 Feeling/ Response: foot Comments: pt tolerated well   Performed By: Katina Parlor, RMA  Follow Up: next week

## 2024-02-04 ENCOUNTER — Ambulatory Visit
Admission: RE | Admit: 2024-02-04 | Discharge: 2024-02-04 | Disposition: A | Discharge status: Home / Self Care | Source: Ambulatory Visit | Attending: Orthopedic Surgery | Admitting: Orthopedic Surgery

## 2024-02-04 DIAGNOSIS — Z9889 Other specified postprocedural states: Secondary | ICD-10-CM | POA: Diagnosis present

## 2024-02-04 DIAGNOSIS — R2689 Other abnormalities of gait and mobility: Secondary | ICD-10-CM

## 2024-02-04 DIAGNOSIS — R200 Anesthesia of skin: Secondary | ICD-10-CM | POA: Diagnosis present

## 2024-02-07 ENCOUNTER — Ambulatory Visit (INDEPENDENT_AMBULATORY_CARE_PROVIDER_SITE_OTHER): Admitting: Physician Assistant

## 2024-02-07 DIAGNOSIS — N3941 Urge incontinence: Secondary | ICD-10-CM

## 2024-02-07 NOTE — Progress Notes (Signed)
 PTNS   Session # 12/12   Health & Social Factors: no change  Caffeine: 1.5 cups every morning  Alcohol: 0 Daytime voids #per day: 4-5 Night-time voids #per night: not sure as patient wears a pull up at night Urgency: mild at times and strong at times Incontinence Episodes #per day: pt can't say Ankle used: right Treatment Setting: 8 Feeling/ Response: sensory Comments: pt tolerated well , f/u 1 month

## 2024-02-22 NOTE — Progress Notes (Deleted)
 Referring Physician:  Sadie Manna, MD [address] [facility] [place],  [place] [address]  Primary Physician:  Sadie Manna, MD  History of Present Illness: 02/22/2024 Brandy Oneill has a history of chronic venous insufficiency, HTN, CAD, DM, psoriasis, lymphedema, hyperlipidemia, cardiac stents, s/p aortic valve replacement.   She is s/p L3-L5 decompression by Dr. Clois on 02/06/22. She did well after surgery. She had some persistent numbness from knee laterally into her foot since the surgery.   Last seen by me on 01/22/24 with constant numbness from her knees down into her toes. She was having trouble walking and felt like she may fall.   She is here to review her lumbar and thoracic MRI scans.       No recent lumbar imaging. No issues with hand dexterity. Her gait is very slow and unsteady.   She has no current back or leg pain. She has constant numbness from her knees down into her toes x 3-4 weeks. No known injury. She feels like her legs are tight and weak. Symptoms are worse in the  morning, she has trouble walking. Symptoms improve as the day progresses.   No issues with hand dexterity. Feels like her balance is off.   She does not smoke.   Bowel/Bladder Dysfunction: history of bladder incontinence x years. No bowel issues.   Conservative measures:  Physical therapy:  has participated in the past (2023), none recently Multimodal medical therapy including regular antiinflammatories: none Injections:   09/26/2021: Right L5-S1 and left S1 TF ESI 06/13/2021: Right L5-S1 and left S1 TF ESI (moderate relief) 03/19/2020: Right L5-S1 and left S1 TF ESI (moderate to good relief) 12/31/2019: Right L5-S1 TF ESI (moderate to good relief) 10/31/2018: Bilateral L5-S1 TF ESI (complete relief) 10/03/2018: Bilateral L5-S1 TF ESI (moderate to good relief)   Past Surgery:  02/06/22: L3-5 Decompression  Brandy Oneill has no dexterity issues.  Having balance issues.   The symptoms are causing a significant impact on the patient's life.   Review of Systems:  A 10 point review of systems is negative, except for the pertinent positives and negatives detailed in the HPI.  Past Medical History: Past Medical History:  Diagnosis Date   Anxiety    Aortic atherosclerosis (HCC)    Arthritis    CAD (coronary artery disease) 04/14/2020   a.) R/LHC 04/14/2020: mod CAD with sev obstructive disease in large dom RCA; staged PCI planned. b.) PCI 04/28/2020: DES (unknown type) placed to dRCA.   DDD (degenerative disc disease), lumbar    a.) s/p L3-L5 decompression 02/06/2022   Depression    Diastolic dysfunction 01/06/2019   a.) TTE 01/06/2019: EF >55%; triv panvalvular regurgitation; G1DD. 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 b.) TTE 01/26/2020: EF >55%; sev AS (MPG 47.1 mmHg). c.) RHC 04/14/2020: norm CI,  PVR, RAP; PCWP 10. d.) s/p TAVR 05/11/2020. e.) TTE 06/01/2020: EF >55%; mild LVH; MPG 9 mmHg. f.) TTE 06/16/2021: EF >55%; mild LVH; GLS -11.6%; MPG 24 mmHg.   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   a.) 23 mm Edwards Sapien S3 bioprosthetic valve   Status post implantation of urinary electronic  stimulator device 2013   a.) placed in [place]  back in 2013 after bladder was damaged from attempts to break up kidney stones per patient report; b.) device failed/stopped working just before [employer] (~ 2020); c.) plans for removal 02/2023   T2DM (type 2 diabetes mellitus) (HCC)    Tubular adenoma of colon    Varicose veins of both legs with edema     Past Surgical History: Past Surgical History:  Procedure Laterality Date   BLADDER STIMULATOR PLACEMENT  2013   Placed in [place]    CATARACT EXTRACTION W/ INTRAOCULAR LENS IMPLANT Bilateral 2014   CESAREAN SECTION     COLONOSCOPY WITH PROPOFOL  N/A 06/26/2018   Procedure: COLONOSCOPY WITH PROPOFOL ;  Surgeon: Toledo, Ladell POUR, MD;  Location: [facility];  Service: Gastroenterology;  Laterality: N/A;   INTERSTIM IMPLANT REMOVAL N/A 02/26/2023   Procedure: REMOVAL OF INTERSTIM IMPLANT;  Surgeon: Gaston Hamilton, MD;  Location: [facility];  Service: Urology;  Laterality: N/A;   JOINT REPLACEMENT Right 10/27/2016   JOINT REPLACEMENT Left 2014   KNEE CLOSED REDUCTION Right 11/29/2016   Procedure: CLOSED MANIPULATION KNEE;  Surgeon: Helayne Glenn, MD;  Location: [facility];  Service: Orthopedics;  Laterality: Right;   LUMBAR LAMINECTOMY/DECOMPRESSION MICRODISCECTOMY N/A 02/06/2022   Procedure: L3-5 DECOMPRESSION;  Surgeon: Clois Fret, MD;  Location: [facility] OR[facility];  Service: Neurosurgery;  Laterality: N/A;   PARS PLANA VITRECTOMY Right 2018   Procedure: VITRECTOMY, MECHANICAL, PARS PLANA APPROACH; WITH REMOVAL PRERETINAL CELLULAR MEMBRANE(EG, MACULAR PUCKER); Surgeon: Madison Concetta Hoof, MD; Location[facility]; Service: Ophthalmology   PERCUTANEOUS CORONARY STENT INTERVENTION (PCI-S)  04/28/2020   Procedure: PERCUTANEOUS CORONARY STENT INTERVENTION (PCI-S); Location: [facility]   RIGHT/LEFT HEART CATH AND CORONARY ANGIOGRAPHY Right 04/14/2020   Procedure: RIGHT/LEFT HEART CATH AND CORONARY ANGIOGRAPHY; Location: [facility]    TRANSCATHETER AORTIC VALVE REPLACEMENT, TRANSFEMORAL N/A 05/11/2020   ProcedureL TRANSCATHETER AORTIC VALVE REPLACEMENT, TRANSFEMORAL; Location: [facility]; Surgeon: DOROTHA Franky Minerva, MD    Allergies: Allergies as of 03/04/2024 - Review Complete 02/07/2024  Allergen Reaction Noted   Kiwi extract Anaphylaxis 02/04/2018   Sulfa antibiotics  02/24/2021    Medications: Outpatient Encounter Medications as of 03/04/2024  Medication Sig   b complex vitamins capsule Take 1 capsule by mouth daily.   calcium carbonate (TUMS EX) 750 MG chewable tablet Chew 1 tablet by mouth as needed for heartburn.   EPINEPHrine  (EPIPEN  IJ) Inject as directed.   fexofenadine (ALLEGRA) 180 MG tablet Take 180 mg by mouth as needed for allergies.   fluticasone  (FLONASE ) 50 MCG/ACT nasal spray as needed.   glimepiride (AMARYL) 2 MG tablet Take 2 mg by mouth daily.   lisinopril  (PRINIVIL ,ZESTRIL ) 40 MG tablet Take 40 mg by mouth at bedtime.   Misc. Devices (ROLLATOR ULTRA-LIGHT) MISC Light weight rollator walker with brakes  Needed for gait stability to help prevent falls.   Multiple Vitamin (MULTIVITAMIN WITH MINERALS) TABS tablet Take 1 tablet by mouth daily. Centrum for Women   pravastatin  (PRAVACHOL ) 10 MG tablet Take 10 mg by mouth every  evening.   solifenacin  (VESICARE ) 10 MG tablet Take 1 tablet (10 mg total) by mouth daily.   venlafaxine  (EFFEXOR ) 37.5 MG tablet Take 37.5 mg by mouth daily.   No facility-administered encounter medications on file as of 03/04/2024.    Social History: Social History   Tobacco Use   Smoking status: Former    Current packs/day: 0.00    Types: Cigarettes    Quit date: 11/27/2012    Years since quitting: 11.2   Smokeless tobacco: Never  Substance Use Topics   Alcohol use: Yes    Comment: rare 1 drink per month   Drug use: No    Family Medical History: Family History  Problem Relation Age of Onset   Breast cancer Neg Hx     Physical Examination: There were no vitals  filed for this visit.    Awake, alert, oriented to person, place, and time.  Speech is clear and fluent. Fund of knowledge is appropriate.   Cranial Nerves: Pupils equal round and reactive to light.  Facial tone is symmetric.    Well healed lumbar incision.   No abnormal lesions on exposed skin.   Strength: Side Biceps Triceps Deltoid Interossei Grip Wrist Ext. Wrist Flex.  R 5 5 5 5 5 5 5   L 5 5 5 5 5 5 5    Side Iliopsoas Quads Hamstring PF DF EHL  R 5 5 5 5 5 5   L 5 5 5 5 5 5    Reflexes are 2+ and symmetric at the biceps, brachioradialis, patella and achilles.   Hoffman's is absent.  Clonus is not present.   Bilateral upper and lower extremity sensation is intact to light touch, but subjectively diminished in bilateral legs.   Gait is slow unsteady gait. Takes small short steps. Using a cane.   Medical Decision Making  Imaging: Thoracic MRI dated 02/04/24:  FINDINGS: Alignment:  Normal.   Vertebrae: No fracture, suspicious marrow lesion, or significant marrow edema. Hemangioma in the C6 vertebral body.   Cord:  Normal signal.   Paraspinal and other soft tissues: 1.4 cm cutaneous/subcutaneous T2 hyperintense nodule to the right of midline at T5-6, likely an inclusion cyst. Suspected subcentimeter inclusion cyst to the left of midline at the same level.   Disc levels:   Better mild disc space narrowing and mild disc bulging throughout the thoracic spine. Shallow right central disc osteophyte complex at T3-4 slightly deforms the ventral spinal cord. Widely patent spinal canal and neural foramina throughout.   IMPRESSION: Mild thoracic disc degeneration without stenosis.     Electronically Signed   By: Dasie Hamburg M.D.   On: 02/11/2024 15:34   Lumbar MRI dated 02/04/24:  FINDINGS: Segmentation:  Standard.   Alignment:  Unchanged grade 1 anterolisthesis of L4 on L5.   Vertebrae: No fracture, suspicious marrow lesion, or evidence of discitis.   Conus  medullaris and cauda equina: Conus extends to the L1 level and is normal in signal. Suspected unchanged 6 mm intradural nodule associated with the cauda equina in the midline at L1-2.   Paraspinal and other soft tissues: Unremarkable.   Disc levels:   Disc desiccation throughout the lumbar spine. Mild disc space narrowing from L2-3 through L4-5.   T12-L1: Minimal disc bulging and endplate spurring without stenosis, unchanged.   L1-2: Mild disc bulging and mild facet hypertrophy without stenosis, unchanged.   L2-3: Circumferential disc bulging and moderate facet and ligamentum flavum hypertrophy result in mild spinal stenosis and mild left neural  foraminal stenosis, unchanged.   L3-4: Interval right laminectomy without residual spinal stenosis. Disc bulging and moderate facet hypertrophy result in unchanged mild left neural foraminal stenosis.   L4-5: Interval right laminectomy. Anterolisthesis with bulging of uncovered disc, a central disc protrusion, and severe facet hypertrophy result in borderline spinal stenosis, mild left lateral recess stenosis, and mild bilateral neural foraminal stenosis.   L5-S1: Disc bulging, endplate spurring, and severe facet hypertrophy result in mild left neural foraminal stenosis without spinal stenosis, unchanged.   IMPRESSION: 1. Interval laminectomies at L3-4 and L4-5 without significant residual spinal stenosis. 2. Unchanged mild spinal stenosis at L2-3. 3. Unchanged mild multilevel neural foraminal stenosis. 4. 6 mm intradural nodule at L1-2 which may reflect a small nerve sheath tumor.     Electronically Signed   By: Dasie Hamburg M.D.   On: 02/11/2024 15:29  I have personally reviewed the images and agree with the above interpretation.   Assessment and Plan: Brandy Oneill is s/p L3-L5 decompression by Dr. Clois on 02/06/22. She did well after surgery. She had some persistent numbness from knee laterally into her foot since the  surgery.   She has no current back or leg pain. She has 3-4 weeks history of constant numbness from her knees down into her toes. She is having trouble walking and feels like she may fall.   No recent lumbar imaging. No issues with hand dexterity. Her gait is very slow and unsteady.   Treatment options discussed with patient and following plan made:   - MRI of thoracic and lumbar spine to evaluate lower extremity numbness/unsteady gait.  - Prescription for rollator to help with gait and prevent falls.  - Depending on results of MRIs, may consider EMG/NCS of lower extremities and/or referral to neurology.  - May consider cervical MRI, but has no upper extremity symptoms.  - Will schedule follow up visit to review MRI results once I get them back.   I spent a total of 40 minutes in face-to-face and non-face-to-face activities related to this patient's care today including review of outside records, review of imaging, review of symptoms, physical exam, discussion of differential diagnosis, discussion of treatment options, and documentation.   Thank you for involving me in the care of this patient.   Glade Boys PA-C Dept. of Neurosurgery

## 2024-02-25 NOTE — Progress Notes (Unsigned)
 Referring Physician:  Sadie Manna, MD [address] [facility] [place],  [place] [address]  Primary Physician:  Sadie Manna, MD  History of Present Illness: 02/26/2024 [name] has a history of chronic venous insufficiency, HTN, CAD, DM, psoriasis, lymphedema, hyperlipidemia, cardiac stents, s/p aortic valve replacement.   She is s/p L3-L5 decompression by Dr. Clois on 02/06/22. She did well after surgery. She had some persistent numbness from knee laterally into her foot since the surgery.   Last seen by me on 01/22/24 with constant numbness from her knees down into her toes. She was having trouble walking and felt like she may fall.   She is here to review her lumbar and thoracic MRI scans.   She has no current back pain. She has occasional pain in her legs from knees down, but primary complaint is constant numbness from her knees down into her toes x 2 months. No known injury. She feels like her legs are tight and weak. Symptoms are worse in the  morning, she has trouble walking. She feels like a drunken sailor. She is also worse after prolonged sitting.   She had some numbness in lateral legs after her surgery, but nothing like this.   No issues with hand dexterity. No neck or arm pain.   She does not smoke.   Bowel/Bladder Dysfunction: history of bladder incontinence x years. No bowel issues.   Conservative measures:  Physical therapy:  has participated in the past (2023), none recently Multimodal medical therapy including regular antiinflammatories: none Injections:   09/26/2021: Right L5-S1 and left S1 TF ESI 06/13/2021: Right L5-S1 and left S1 TF ESI (moderate relief) 03/19/2020: Right L5-S1 and left S1 TF ESI (moderate to good relief) 12/31/2019: Right L5-S1 TF ESI (moderate to good relief) 10/31/2018: Bilateral L5-S1 TF ESI (complete relief) 10/03/2018: Bilateral L5-S1 TF ESI (moderate to good relief)   Past Surgery:   02/06/22: L3-5 Decompression  Verginia Oneill has no dexterity issues. Having balance issues.   The symptoms are causing a significant impact on the patient's life.   Review of Systems:  A 10 point review of systems is negative, except for the pertinent positives and negatives detailed in the HPI.  Past Medical History: Past Medical History:  Diagnosis Date   Anxiety    Aortic atherosclerosis (HCC)    Arthritis    CAD (coronary artery disease) 04/14/2020   a.) R/LHC 04/14/2020: mod CAD with sev obstructive disease in large dom RCA; staged PCI planned. b.) PCI 04/28/2020: DES (unknown type) placed to dRCA.   DDD (degenerative disc disease), lumbar    a.) s/p L3-L5 decompression 02/06/2022   Depression    Diastolic dysfunction 01/06/2019   a.) TTE 01/06/2019: EF >55%; triv panvalvular regurgitation; G1DD. 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 b.) TTE 01/26/2020: EF >55%;  sev AS (MPG 47.1 mmHg). c.) RHC 04/14/2020: norm CI, PVR, RAP; PCWP 10. d.) s/p TAVR 05/11/2020. e.) TTE 06/01/2020: EF >55%; mild LVH; MPG 9 mmHg. f.) TTE 06/16/2021: EF >55%; mild LVH; GLS -11.6%; MPG 24 mmHg.   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   a.) 23 mm Edwards Sapien  S3 bioprosthetic valve   Status post implantation of urinary electronic stimulator device 2013   a.) placed in [place]  back in 2013 after bladder was damaged from attempts to break up kidney stones per patient report; b.) device failed/stopped working just before [employer] (~ 2020); c.) plans for removal 02/2023   T2DM (type 2 diabetes mellitus) (HCC)    Tubular adenoma of colon    Varicose veins of both legs with edema     Past Surgical History: Past Surgical History:  Procedure Laterality Date   BLADDER STIMULATOR PLACEMENT  2013   Placed in [place]    CATARACT EXTRACTION W/ INTRAOCULAR LENS IMPLANT Bilateral 2014   CESAREAN SECTION     COLONOSCOPY WITH PROPOFOL  N/A 06/26/2018   Procedure: COLONOSCOPY WITH PROPOFOL ;  Surgeon: Toledo, Ladell POUR, MD;  Location: [facility] ENDOSCOPY;  Service: Gastroenterology;  Laterality: N/A;   INTERSTIM IMPLANT REMOVAL N/A 02/26/2023   Procedure: REMOVAL OF INTERSTIM IMPLANT;  Surgeon: Gaston Hamilton, MD;  Location: [facility] ORS;  Service: Urology;  Laterality: N/A;   JOINT REPLACEMENT Right 10/27/2016   JOINT REPLACEMENT Left 2014   KNEE CLOSED REDUCTION Right 11/29/2016   Procedure: CLOSED MANIPULATION KNEE;  Surgeon: Helayne Glenn, MD;  Location: [facility] ORS;  Service: Orthopedics;  Laterality: Right;   LUMBAR LAMINECTOMY/DECOMPRESSION MICRODISCECTOMY N/A 02/06/2022   Procedure: L3-5 DECOMPRESSION;  Surgeon: Clois Fret, MD;  Location: [facility] ORS;  Service: Neurosurgery;  Laterality: N/A;   PARS PLANA VITRECTOMY Right 2018   Procedure: VITRECTOMY, MECHANICAL, PARS PLANA APPROACH; WITH REMOVAL PRERETINAL CELLULAR MEMBRANE(EG, MACULAR PUCKER); Surgeon: Madison Concetta Hoof, MD; Location[facility]; Service: Ophthalmology   PERCUTANEOUS CORONARY STENT INTERVENTION (PCI-S)  04/28/2020   Procedure: PERCUTANEOUS CORONARY STENT INTERVENTION (PCI-S); Location: [facility]   RIGHT/LEFT HEART CATH AND CORONARY ANGIOGRAPHY Right 04/14/2020    Procedure: RIGHT/LEFT HEART CATH AND CORONARY ANGIOGRAPHY; Location: [facility]   TRANSCATHETER AORTIC VALVE REPLACEMENT, TRANSFEMORAL N/A 05/11/2020   ProcedureL TRANSCATHETER AORTIC VALVE REPLACEMENT, TRANSFEMORAL; Location: [facility]; Surgeon: DOROTHA Franky Minerva, MD    Allergies: Allergies as of 02/26/2024 - Review Complete 02/26/2024  Allergen Reaction Noted   Kiwi extract Anaphylaxis 02/04/2018   Sulfa antibiotics  02/24/2021    Medications: Outpatient Encounter Medications as of 02/26/2024  Medication Sig   b complex vitamins capsule Take 1 capsule by mouth daily.   calcium carbonate (TUMS EX) 750 MG chewable tablet Chew 1 tablet by mouth as needed for heartburn.   EPINEPHrine  (EPIPEN  IJ) Inject as directed.   fexofenadine (ALLEGRA) 180 MG tablet Take 180 mg by mouth as needed for allergies.   fluticasone  (FLONASE ) 50 MCG/ACT nasal spray as needed.   glimepiride (AMARYL) 2 MG tablet Take 2 mg by mouth daily.   lisinopril  (PRINIVIL ,ZESTRIL ) 40 MG tablet Take 40 mg by mouth at bedtime.   Misc. Devices (ROLLATOR ULTRA-LIGHT) MISC Light weight rollator walker with brakes  Needed for gait stability to help prevent falls.   Multiple Vitamin (MULTIVITAMIN WITH MINERALS) TABS tablet Take 1 tablet by mouth daily. Centrum for Women   pravastatin  (  PRAVACHOL ) 10 MG tablet Take 10 mg by mouth every evening.   solifenacin  (VESICARE ) 10 MG tablet Take 1 tablet (10 mg total) by mouth daily.   venlafaxine  (EFFEXOR ) 37.5 MG tablet Take 37.5 mg by mouth daily.   No facility-administered encounter medications on file as of 02/26/2024.    Social History: Social History   Tobacco Use   Smoking status: Former    Current packs/day: 0.00    Types: Cigarettes    Quit date: 11/27/2012    Years since quitting: 11.2   Smokeless tobacco: Never  Substance Use Topics   Alcohol use: Yes    Comment: rare 1 drink per month   Drug use: No    Family Medical History: Family History  Problem Relation Age of Onset    Breast cancer Neg Hx     Physical Examination: Vitals:   02/26/24 1045  BP: 134/86      Awake, alert, oriented to person, place, and time.  Speech is clear and fluent. Fund of knowledge is appropriate.   Cranial Nerves: Pupils equal round and reactive to light.  Facial tone is symmetric.    Well healed lumbar incision.   No abnormal lesions on exposed skin.   Strength: Side Biceps Triceps Deltoid Interossei Grip Wrist Ext. Wrist Flex.  R 5 5 5 5 5 5 5   L 5 5 5 5 5 5 5    Side Iliopsoas Quads Hamstring PF DF EHL  R 5 5 5 5 5 5   L 5 5 5 5 5 5    Reflexes are 2+ and symmetric at the biceps, brachioradialis, patella and achilles.   Hoffman's is absent.  Clonus is not present.   Bilateral upper and lower extremity sensation is intact to light touch, but subjectively diminished in bilateral legs from knees down into feet.    Gait is slow unsteady gait. Takes small short steps. Using a RW.   Medical Decision Making  Imaging: Thoracic MRI dated 02/04/24:  FINDINGS: Alignment:  Normal.   Vertebrae: No fracture, suspicious marrow lesion, or significant marrow edema. Hemangioma in the C6 vertebral body.   Cord:  Normal signal.   Paraspinal and other soft tissues: 1.4 cm cutaneous/subcutaneous T2 hyperintense nodule to the right of midline at T5-6, likely an inclusion cyst. Suspected subcentimeter inclusion cyst to the left of midline at the same level.   Disc levels:   Better mild disc space narrowing and mild disc bulging throughout the thoracic spine. Shallow right central disc osteophyte complex at T3-4 slightly deforms the ventral spinal cord. Widely patent spinal canal and neural foramina throughout.   IMPRESSION: Mild thoracic disc degeneration without stenosis.     Electronically Signed   By: Dasie Hamburg M.D.   On: 02/11/2024 15:34   Lumbar MRI dated 02/04/24:  FINDINGS: Segmentation:  Standard.   Alignment:  Unchanged grade 1 anterolisthesis of L4 on  L5.   Vertebrae: No fracture, suspicious marrow lesion, or evidence of discitis.   Conus medullaris and cauda equina: Conus extends to the L1 level and is normal in signal. Suspected unchanged 6 mm intradural nodule associated with the cauda equina in the midline at L1-2.   Paraspinal and other soft tissues: Unremarkable.   Disc levels:   Disc desiccation throughout the lumbar spine. Mild disc space narrowing from L2-3 through L4-5.   T12-L1: Minimal disc bulging and endplate spurring without stenosis, unchanged.   L1-2: Mild disc bulging and mild facet hypertrophy without stenosis, unchanged.   L2-3: Circumferential  disc bulging and moderate facet and ligamentum flavum hypertrophy result in mild spinal stenosis and mild left neural foraminal stenosis, unchanged.   L3-4: Interval right laminectomy without residual spinal stenosis. Disc bulging and moderate facet hypertrophy result in unchanged mild left neural foraminal stenosis.   L4-5: Interval right laminectomy. Anterolisthesis with bulging of uncovered disc, a central disc protrusion, and severe facet hypertrophy result in borderline spinal stenosis, mild left lateral recess stenosis, and mild bilateral neural foraminal stenosis.   L5-S1: Disc bulging, endplate spurring, and severe facet hypertrophy result in mild left neural foraminal stenosis without spinal stenosis, unchanged.   IMPRESSION: 1. Interval laminectomies at L3-4 and L4-5 without significant residual spinal stenosis. 2. Unchanged mild spinal stenosis at L2-3. 3. Unchanged mild multilevel neural foraminal stenosis. 4. 6 mm intradural nodule at L1-2 which may reflect a small nerve sheath tumor.     Electronically Signed   By: Dasie Hamburg M.D.   On: 02/11/2024 15:29  I have personally reviewed the images and agree with the above interpretation.   Assessment and Plan: Ms. Bancroft is s/p L3-L5 decompression by Dr. Clois on 02/06/22. She did  well after surgery. She had some persistent numbness from knee laterally into her foot since the surgery, nothing like her current symptoms.    She has no current back pain. She has occasional pain in her legs from knees down, but primary complaint is constant numbness from her knees down into her toes x 2 months. She feels like a drunken sailor.   Thoracic MRI looks okay.   She has known lumbar spondylosis with mild central stenosis at L2-L3 along with mild left foraminal stenosis L2-L4 and L5-S1. 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   Above findings to not fully explain her symptoms.   No issues with hand dexterity. No neck or arm pain. Her gait/balance are getting worse.   Treatment options discussed with patient and following plan made:   - EMG/NCS of lower extremities ordered. Orders sent to [facility].  - Will review with Dr. Claudene regarding further recommendations. May consider cervical MRI and/or formal evaluation with neurology. - She is having intermittent dizziness. Will follow up with PCP regarding this, I don't think it is spine mediated.  - Will message her with further recommendations after I review with Dr. Claudene.   I spent a total of 30 minutes in face-to-face and non-face-to-face activities related to this patient's care today including review of outside records, review of imaging, review of symptoms, physical exam, discussion of differential diagnosis, discussion of treatment options, and documentation.   Glade Boys PA-C Dept. of Neurosurgery

## 2024-02-25 NOTE — Telephone Encounter (Signed)
 Spoke with patient. Moved up her appointment to see me tomorrow.

## 2024-02-26 ENCOUNTER — Encounter: Payer: Self-pay | Admitting: Orthopedic Surgery

## 2024-02-26 ENCOUNTER — Ambulatory Visit (INDEPENDENT_AMBULATORY_CARE_PROVIDER_SITE_OTHER): Admitting: Orthopedic Surgery

## 2024-02-26 VITALS — BP 134/86 | Ht 63.5 in | Wt 223.0 lb

## 2024-02-26 DIAGNOSIS — R200 Anesthesia of skin: Secondary | ICD-10-CM | POA: Diagnosis not present

## 2024-02-26 DIAGNOSIS — M4807 Spinal stenosis, lumbosacral region: Secondary | ICD-10-CM | POA: Diagnosis not present

## 2024-02-26 DIAGNOSIS — M47816 Spondylosis without myelopathy or radiculopathy, lumbar region: Secondary | ICD-10-CM

## 2024-02-26 DIAGNOSIS — M48061 Spinal stenosis, lumbar region without neurogenic claudication: Secondary | ICD-10-CM | POA: Diagnosis not present

## 2024-02-26 DIAGNOSIS — Z9889 Other specified postprocedural states: Secondary | ICD-10-CM

## 2024-02-26 NOTE — Patient Instructions (Signed)
 It was so nice to see you today. Thank you so much for coming in.    The MRI of your mid back looks good. You have some wear and tear in your lower back, but I don't think this is causing numbness/tingling in your legs. 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 I don't think this is causing your symptoms either.   I want to get an EMG (nerve conduction test) to look into things further. I have ordered this and [facility] will call you to schedule. You can also call them at [phone].   I will review everything with Dr. Claudene- Dr. Clois is out of the office. I will message you with further recommendations and plan.   Follow up with your PCP about the dizziness, I don't think this is related to your spine.   Please do not hesitate to call if you have any questions or concerns. You can also message me in MyChart.   Glade Boys PA-C [phone]     The physicians and staff at [facility] Neurosurgery at [facility] are committed to providing excellent care. You may receive a survey asking for feedback about your experience at our office. We value you your feedback and appreciate you taking the time to to fill it out. The [facility] leadership team is also available to discuss your experience in person, feel free to contact us  [phone].

## 2024-02-28 ENCOUNTER — Other Ambulatory Visit: Payer: Self-pay

## 2024-02-28 ENCOUNTER — Encounter: Payer: Self-pay | Admitting: Neurology

## 2024-02-28 DIAGNOSIS — R202 Paresthesia of skin: Secondary | ICD-10-CM

## 2024-03-04 ENCOUNTER — Ambulatory Visit: Admitting: Orthopedic Surgery

## 2024-03-05 ENCOUNTER — Ambulatory Visit: Admitting: Urology

## 2024-03-19 ENCOUNTER — Ambulatory Visit: Admitting: Urology

## 2024-03-20 ENCOUNTER — Ambulatory Visit (INDEPENDENT_AMBULATORY_CARE_PROVIDER_SITE_OTHER): Admitting: Neurology

## 2024-03-20 DIAGNOSIS — R202 Paresthesia of skin: Secondary | ICD-10-CM

## 2024-03-20 NOTE — Procedures (Signed)
 [facility]  [address] [place], [address]  [place], [place] [address] Tel: [phone] Fax: [phone] Test Date:  03/20/2024  Patient: Brandy Oneill DOB: 04/03/1951 Physician: Tonita Blanch, DO  Sex: Female Height: 5' 3 Ref Phys: Glade Boys, DEVONNA  ID#: [id number]   Technician:    History: This is a 73 year old female referred for evaluation of bilateral leg numbness.  NCV & EMG Findings: Extensive electrodiagnostic testing of the right lower extremity and additional studies on the left shows:  Bilateral sural and superficial peroneal sensory responses are within normal limits. Bilateral peroneal motor responses at the extensor digitorum brevis is absent.  Pperoneal motor response at the tibialis anterior is reduced on the right, and normal on the left.  Bilateral tibial motor responses are within normal limits. Bilateral tibial H reflex studies are absent and most likely technical in nature. Chronic motor axonal loss changes are seen affecting the right L5 myotome, without accompanying active denervation.  These findings are not present in the left lower extremity.   Impression: Chronic L5 radiculopathy affecting the right lower extremity, mild. There is no evidence of a large fiber sensorimotor polyneuropathy affecting the lower extremities.   ___________________________ Tonita Blanch, DO    Nerve Conduction Studies   Stim Site NR Peak (ms) Norm Peak (ms) O-P Amp (V) Norm O-P Amp  Left Sup Peroneal Anti Sensory (Ant Lat Mall)  32 C  12 cm    2.4 <4.6 5.8 >3  Right Sup Peroneal Anti Sensory (Ant Lat Mall)  32 C  12 cm    2.1 <4.6 5.9 >3  Left Sural Anti Sensory (Lat Mall)  32 C  Calf    3.7 <4.6 4.0 >3  Right Sural Anti Sensory (Lat Mall)  32 C  Calf    2.5 <4.6 4.8 >3     Stim Site NR Onset (ms) Norm Onset (ms) O-P Amp (mV) Norm O-P Amp Site1 Site2 Delta-0 (ms) Dist (cm) Vel (m/s) Norm Vel (m/s)  Left Peroneal Motor (Ext Dig Brev)  32 C  Ankle *NR   <6.0  >2.5 B Fib Ankle  0.0  >40  B Fib *NR     Poplt B Fib  0.0  >40  Poplt *NR            Right Peroneal Motor (Ext Dig Brev)  32 C  Ankle *NR  <6.0  >2.5 B Fib Ankle  0.0  >40  B Fib *NR     Poplt B Fib  0.0  >40  Poplt *NR            Left Peroneal TA Motor (Tib Ant)  32 C  Fib Head    3.0 <4.5 3.7 >3 Poplit Fib Head 1.6 10.0 63 >40  Poplit    4.6 <5.7 3.7         Right Peroneal TA Motor (Tib Ant)  32 C  Fib Head    3.1 <4.5 *2.2 >3 Poplit Fib Head 1.4 8.0 57 >40  Poplit    4.5 <5.7 2.1         Left Tibial Motor (Abd Hall Brev)  32 C  Ankle    5.5 <6.0 7.6 >4 Knee Ankle 9.7 40.0 41 >40  Knee    15.2  4.2         Right Tibial Motor (Abd Hall Brev)  32 C  Ankle    5.3 <6.0 10.3 >4 Knee Ankle 10.2 42.0 41 >40  Knee  15.5  5.0          Electromyography   Side Muscle Ins.Act Fibs Fasc Recrt Amp Dur Poly Activation Comment  Right AntTibialis Nml Nml Nml *1- *1+ *1+ *1+ Nml N/A  Right Gastroc Nml Nml Nml Nml Nml Nml Nml Nml N/A  Right Flex Dig Long Nml Nml Nml *2- *1+ *1+ *1+ Nml N/A  Right RectFemoris Nml Nml Nml Nml Nml Nml Nml Nml N/A  Right BicepsFemS Nml Nml Nml Nml Nml Nml Nml Nml N/A  Right GluteusMed Nml Nml Nml *1- *1+ *1+ *1+ Nml N/A  Left AntTibialis Nml Nml Nml Nml Nml Nml Nml Nml N/A  Left Gastroc Nml Nml Nml Nml Nml Nml Nml Nml N/A  Left Flex Dig Long Nml Nml Nml Nml Nml Nml Nml Nml N/A  Left RectFemoris Nml Nml Nml Nml Nml Nml Nml Nml N/A  Left GluteusMed Nml Nml Nml Nml Nml Nml Nml Nml N/A      Waveforms:

## 2024-03-24 NOTE — Progress Notes (Unsigned)
 Telephone Visit- Progress Note: Referring Physician:  Sadie Manna, MD [address] [facility] [place],  [place] [address]  Primary Physician:  Sadie Manna, MD  This visit was performed via telephone.  Patient location: home Provider location: office  I spent a total of 10 minutes non-face-to-face activities for this visit on the date of this encounter including review of current clinical condition and response to treatment.    Patient has given verbal consent to this telephone visits and we reviewed the limitations of a telephone visit. Patient wishes to proceed.    Chief Complaint:  review EMG  History of Present Illness: Brandy Oneill is a 73 y.o. female has a history of chronic venous insufficiency, HTN, CAD, DM, psoriasis, lymphedema, hyperlipidemia, cardiac stents, s/p aortic valve replacement.    She is s/p L3-L5 decompression by Dr. Clois on 02/06/22. She did well after surgery. She had some persistent numbness from knee laterally into her foot since the surgery.    Last seen by me on 02/26/24 with constant numbness from knees down into toes and unsteady gait. Thoracic MRI looked okay. She has known lumbar spondylosis with mild central stenosis at L2-L3 along with mild left foraminal stenosis L2-L4 and L5-S1. 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   EMG was ordered and phone visit scheduled to review it.   She is about the same. Her primary complaint is constant numbness from her knees down into her toes x 3 months. She feels like her legs are weak and that she walks like she is drunk.  Symptoms are worse in the  morning. No significant back pain.    She had some numbness in lateral legs after her surgery that improved somewhat with postop PT.    No issues with hand dexterity. No neck or arm pain.    She does not smoke.    Bowel/Bladder Dysfunction: history of bladder incontinence x  years. No bowel issues.    Conservative measures:  Physical therapy:  has participated in the past (2023), none recently Multimodal medical therapy including regular antiinflammatories: none Injections:   09/26/2021: Right L5-S1 and left S1 TF ESI 06/13/2021: Right L5-S1 and left S1 TF ESI (moderate relief) 03/19/2020: Right L5-S1 and left S1 TF ESI (moderate to good relief) 12/31/2019: Right L5-S1 TF ESI (moderate to good relief) 10/31/2018: Bilateral L5-S1 TF ESI (complete relief) 10/03/2018: Bilateral L5-S1 TF ESI (moderate to good relief)     Past Surgery:  02/06/22: L3-5 Decompression      Exam: No exam done as this was a telephone encounter.     Imaging: EMG of bilateral lower extremities dated 03/20/24:  NCV & EMG Findings: Extensive electrodiagnostic testing of the right lower extremity and additional studies on the left shows:  Bilateral sural and superficial peroneal sensory responses are within normal limits. Bilateral peroneal motor responses at the extensor digitorum brevis is absent.  Pperoneal motor response at the tibialis anterior is reduced on the right, and normal on the left.  Bilateral tibial motor responses are within normal limits. Bilateral tibial H reflex studies are absent and most likely technical in nature. Chronic motor axonal loss changes are seen affecting the right L5 myotome, without accompanying active denervation.  These findings are not present in the left lower extremity.     Impression: Chronic L5 radiculopathy affecting the right lower extremity, mild. There is no evidence of a large fiber sensorimotor polyneuropathy affecting the lower extremities.  ___________________________ Tonita Blanch, DO   Assessment and Plan: Ms. Woodyard is s/p L3-L5 decompression by Dr. Clois on 02/06/22. She did well after surgery. She had some persistent numbness from knee laterally into her foot since the surgery that improved some with PT.    3 month  history of constant numbness from her knees down into her toes. She feels like her legs are weak and that she walks like she is drunk.  Symptoms are worse in the  morning. No significant back pain.    No issues with hand dexterity. No neck or arm pain.    Thoracic MRI looked okay. EMG shows chronic L5 radiculopathy affecting the right lower extremity, mild.   She has known lumbar spondylosis with mild central stenosis at L2-L3 along with mild left foraminal stenosis L2-L4 and L5-S1. 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    Above findings to not fully explain her symptoms.    No issues with hand dexterity. No neck or arm pain. Her gait/balance are getting worse.    Treatment options discussed with patient and following plan made:   -Recommend referral to neurology Mathias) for evaluation of lower extremity numbness. This does not appear to be lumbar mediated.  - Reviewed with Dr. Clois and he agrees with above. Patient sent a message.   Glade Boys PA-C Neurosurgery

## 2024-03-25 ENCOUNTER — Encounter: Payer: Self-pay | Admitting: Urology

## 2024-03-25 ENCOUNTER — Encounter: Payer: Self-pay | Admitting: Orthopedic Surgery

## 2024-03-25 ENCOUNTER — Ambulatory Visit (INDEPENDENT_AMBULATORY_CARE_PROVIDER_SITE_OTHER): Admitting: Orthopedic Surgery

## 2024-03-25 DIAGNOSIS — M48061 Spinal stenosis, lumbar region without neurogenic claudication: Secondary | ICD-10-CM | POA: Diagnosis not present

## 2024-03-25 DIAGNOSIS — R200 Anesthesia of skin: Secondary | ICD-10-CM | POA: Diagnosis not present

## 2024-03-25 DIAGNOSIS — R2689 Other abnormalities of gait and mobility: Secondary | ICD-10-CM | POA: Diagnosis not present

## 2024-03-25 DIAGNOSIS — M4726 Other spondylosis with radiculopathy, lumbar region: Secondary | ICD-10-CM | POA: Diagnosis not present

## 2024-03-25 DIAGNOSIS — Z9889 Other specified postprocedural states: Secondary | ICD-10-CM

## 2024-03-25 DIAGNOSIS — M47816 Spondylosis without myelopathy or radiculopathy, lumbar region: Secondary | ICD-10-CM

## 2024-03-25 DIAGNOSIS — M5416 Radiculopathy, lumbar region: Secondary | ICD-10-CM

## 2024-03-26 ENCOUNTER — Ambulatory Visit: Admitting: Urology

## 2024-05-05 ENCOUNTER — Ambulatory Visit (INDEPENDENT_AMBULATORY_CARE_PROVIDER_SITE_OTHER): Admitting: Neurology

## 2024-05-05 ENCOUNTER — Encounter: Payer: Self-pay | Admitting: Neurology

## 2024-05-05 VITALS — BP 134/85 | HR 65 | Ht 63.5 in | Wt 220.0 lb

## 2024-05-05 DIAGNOSIS — R200 Anesthesia of skin: Secondary | ICD-10-CM

## 2024-05-05 NOTE — Progress Notes (Signed)
 [facility] Neurology Division Clinic Note - Initial Visit   Date: 05/05/2024   Brandy Oneill MRN: [id number] DOB: 12-06-1950   Dear Glade Boys, PA-C:  Thank you for your kind referral of Brandy Oneill for consultation of bilateral leg numbness. Although her history is well known to you, please allow us  to reiterate it for the purpose of our medical record. The patient was accompanied to the clinic by self.   Brandy Oneill is a 73 y.o. right-handed female with s/p L3-L5 decompression (2023), chronic venous insufficiency, hyperlipidemia, CAD cardiac stent, and s/p aortic valve replacement, prior tobacco use, and diabetes mellitus presenting for evaluation of bilateral leg numbness.   IMPRESSION/PLAN: Bilateral leg numbness.  Symptoms started abruptly in May involving both legs.  There is no evidence of large fiber neuropathy or lumbar canal stenosis to explain symptoms. I explained that onset of symptoms is atypical for neuropathy, which has insidious onset.  With normal sensory and motor responses, an  acute neuropathy such as Guillain-Barre syndrome is unlikely. I will do additional testing to check for small fiber neuropathy as well as screen for vascular disease.   - Skin biopsy for small fiber neuropathy  - ABI bilateral legs  - Consider MRI cervical spine without contrast, if above testing is negative  Further recommendations pending results.  ------------------------------------------------------------- History of present illness: She underwent lumbar surgery in 2023 and reports having residual numbness over the side of the legs.  Starting around May 2025, she began having numbness involving both legs from the knee down into the feet.  Symptoms are constant and worse when she is sitting.  She also reports having difficulty with balance.  She walks with a cane.  MRI lumbar spine shows mild multilevel degenerative changes, no severe nerve impingement.  NCS/EMG of the legs  did not show large fiber neuropathy, there is mild right L5 radiculopathy.    She lives at home with husband in a 2-level home.  She does not smoke and rarely drinks alcohol   Out-side paper records, electronic medical record, and images have been reviewed where available and summarized as:  NCS/EMG of the legs 03/20/2024:   Chronic L5 radiculopathy affecting the right lower extremity, mild. There is no evidence of a large fiber sensorimotor polyneuropathy affecting the lower extremities.  MRI lumbar spine wo contrast 02/11/2024: 1. Interval laminectomies at L3-4 and L4-5 without significant residual spinal stenosis. 2. Unchanged mild spinal stenosis at L2-3. 3. Unchanged mild multilevel neural foraminal stenosis. 4. 6 mm intradural nodule at L1-2 which may reflect a small nerve sheath tumor.  MRI thoracic spine wo contrast 02/11/2024:  Mild thoracic disc degeneration without stenosis.    Past Medical History:  Diagnosis Date   Anxiety    Aortic atherosclerosis (HCC)    Arthritis    CAD (coronary artery disease) 04/14/2020   a.) R/LHC 04/14/2020: mod CAD with sev obstructive disease in large dom RCA; staged PCI planned. b.) PCI 04/28/2020: DES (unknown type) placed to dRCA.   DDD (degenerative disc disease), lumbar    a.) s/p L3-L5 decompression 02/06/2022   Depression    Diastolic dysfunction 01/06/2019   a.) TTE 01/06/2019: EF >55%; triv panvalvular regurgitation; G1DD. 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 b.) TTE 01/26/2020: EF >55%; sev AS (MPG 47.1 mmHg). c.) RHC 04/14/2020: norm CI, PVR, RAP; PCWP 10. d.) s/p TAVR 05/11/2020. e.) TTE 06/01/2020: EF >55%; mild LVH; MPG 9 mmHg. f.) TTE 06/16/2021: EF >55%; mild LVH; GLS -11.6%; MPG 24 mmHg.   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   a.) 23 mm Edwards Sapien S3 bioprosthetic valve   Status post implantation of urinary electronic stimulator device 2013   a.) placed in [place]  back in 2013 after bladder was damaged from attempts to break up kidney stones per patient report; b.) device failed/stopped working just before [employer] (~ 2020); c.) plans for removal 02/2023   T2DM (type 2 diabetes mellitus) (HCC)    Tubular adenoma of colon    Varicose veins of both legs with edema     Past Surgical History:  Procedure Laterality Date   BLADDER STIMULATOR PLACEMENT  2013   Placed in [place]    CATARACT EXTRACTION W/ INTRAOCULAR LENS IMPLANT Bilateral 2014   CESAREAN SECTION     COLONOSCOPY WITH PROPOFOL  N/A 06/26/2018   Procedure: COLONOSCOPY WITH PROPOFOL ;  Surgeon: Toledo, Ladell POUR, MD;  Location: [facility] ENDOSCOPY;  Service: Gastroenterology;  Laterality: N/A;   INTERSTIM IMPLANT REMOVAL N/A 02/26/2023   Procedure: REMOVAL OF INTERSTIM IMPLANT;  Surgeon: Gaston Hamilton, MD;  Location: [facility] ORS;  Service: Urology;  Laterality: N/A;   JOINT REPLACEMENT Right 10/27/2016   JOINT REPLACEMENT Left 2014   KNEE CLOSED REDUCTION Right 11/29/2016   Procedure: CLOSED MANIPULATION KNEE;  Surgeon: Helayne Glenn, MD;  Location: [facility] ORS;  Service: Orthopedics;  Laterality: Right;   LUMBAR LAMINECTOMY/DECOMPRESSION MICRODISCECTOMY N/A 02/06/2022   Procedure: L3-5 DECOMPRESSION;   Surgeon: Clois Fret, MD;  Location: [facility] ORS;  Service: Neurosurgery;  Laterality: N/A;   PARS PLANA VITRECTOMY Right 2018   Procedure: VITRECTOMY, MECHANICAL, PARS PLANA APPROACH; WITH REMOVAL PRERETINAL CELLULAR MEMBRANE(EG, MACULAR PUCKER); Surgeon: Madison Concetta Hoof, MD; [facility]; Service: Ophthalmology   PERCUTANEOUS CORONARY STENT INTERVENTION (PCI-S)  04/28/2020   Procedure: PERCUTANEOUS CORONARY STENT INTERVENTION (PCI-S); Location: [facility]   RIGHT/LEFT HEART CATH AND CORONARY ANGIOGRAPHY Right 04/14/2020   Procedure: RIGHT/LEFT HEART CATH AND CORONARY ANGIOGRAPHY; Location: [facility]   TRANSCATHETER AORTIC VALVE REPLACEMENT, TRANSFEMORAL N/A 05/11/2020   ProcedureL TRANSCATHETER AORTIC VALVE REPLACEMENT, TRANSFEMORAL; Location: [facility]; Surgeon: DOROTHA Franky Minerva, MD     Medications:  Outpatient Encounter Medications as of 05/05/2024  Medication Sig   b complex vitamins capsule Take 1 capsule by mouth daily.   calcium carbonate (TUMS EX) 750 MG chewable tablet Chew 1 tablet by mouth as needed for heartburn.   EPINEPHrine  (EPIPEN  IJ) Inject as directed.   fexofenadine (ALLEGRA) 180 MG tablet Take 180 mg by mouth as needed for allergies.   fluticasone  (FLONASE ) 50 MCG/ACT nasal spray as needed.   glimepiride (AMARYL) 2 MG tablet Take 2 mg by mouth daily.   lisinopril  (PRINIVIL ,ZESTRIL ) 40 MG tablet Take 40 mg by mouth at  bedtime.   Misc. Devices (ROLLATOR ULTRA-LIGHT) MISC Light weight rollator walker with brakes  Needed for gait stability to help prevent falls.   Multiple Vitamin (MULTIVITAMIN WITH MINERALS) TABS tablet Take 1 tablet by mouth daily. Centrum for Women   pravastatin  (PRAVACHOL ) 10 MG tablet Take 10 mg by mouth every evening.   solifenacin  (VESICARE ) 10 MG tablet Take 1 tablet (10 mg total) by mouth daily.   venlafaxine  (EFFEXOR ) 37.5 MG tablet Take 37.5 mg by mouth daily.   No facility-administered encounter medications on file as of 05/05/2024.     Allergies:  Allergies  Allergen Reactions   Kiwi Extract Anaphylaxis   Sulfa Antibiotics     Dizziness, thinks she went to the emergency room     Family History: Family History  Problem Relation Age of Onset   Bipolar disorder Mother    Alcoholism Father    Breast cancer Neg Hx     Social History: Social History   Tobacco Use   Smoking status: Former    Current packs/day: 0.00    Types: Cigarettes    Quit date: 11/27/2012    Years since quitting: 11.4   Smokeless tobacco: Never  Substance Use Topics   Alcohol use: Yes    Comment: rare 1 drink per month   Drug use: No   Social History   Social History Narrative   Lives at home alone. Her sister LIinda is the support person. She has two two      Are you right handed or left handed? Right Handed    Are you currently employed ? No    What is your current occupation? Retired   Do you live at home alone? No    Who lives with you? Husband    What type of home do you live in: 1 story or 2 story? Two story home and one story home        Vital Signs:  BP 134/85   Pulse 65   Ht 5' 3.5 (1.613 m)   Wt 220 lb (99.8 kg)   SpO2 100%   BMI 38.36 kg/m   Neurological Exam: MENTAL STATUS including orientation to time, place, person, recent and remote memory, attention span and concentration, language, and fund of knowledge is normal.  Speech is not dysarthric.  CRANIAL NERVES: II:  No visual field defects.     III-IV-VI: Pupils equal round and reactive to light.  Normal conjugate, extra-ocular eye movements in all directions of gaze.  No nystagmus.  No ptosis.   V:  Normal facial sensation.    VII:  Normal facial symmetry and movements.   VIII:  Normal hearing and vestibular function.   IX-X:  Normal palatal movement.   XI:  Normal shoulder shrug and head rotation.   XII:  Normal tongue strength and range of motion, no deviation or fasciculation.  MOTOR:  No atrophy, fasciculations or abnormal movements.  No  pronator drift.   Upper Extremity:  Right  Left  Deltoid  5/5   5/5   Biceps  5/5   5/5   Triceps  5/5   5/5   Wrist extensors  5/5   5/5   Wrist flexors  5/5   5/5   Finger extensors  5/5   5/5   Finger flexors  5/5   5/5   Dorsal interossei  5/5   5/5   Abductor pollicis  5/5   5/5   Tone (Ashworth scale)  0  0  Lower Extremity:  Right  Left  Hip flexors  5/5   5/5   Knee flexors  5/5   5/5   Knee extensors  5/5   5/5   Dorsiflexors  5/5   5/5   Plantarflexors  5/5   5/5   Toe extensors  5/5   5/5   Toe flexors  5/5   5/5   Tone (Ashworth scale)  0  0   MSRs:                                           Right        Left brachioradialis 2+  2+  biceps 2+  2+  triceps 2+  2+  patellar 2+  2+  ankle jerk 0  0  Hoffman no  no  plantar response down  down   SENSORY:  Reduced vibration at the right ankle and left great toe, temperature and pin prick reduced in feet, intact in the legs.   Romberg's sign absent.   COORDINATION/GAIT: Normal finger-to- nose-finger.  Intact rapid alternating movements bilaterally.  Gait is wide-based, small steps, assisted with cane which she intermittently uses.    Thank you for allowing me to participate in patient's care.  If I can answer any additional questions, I would be pleased to do so.    Sincerely,    Raydin Bielinski K. Tobie, DO

## 2024-05-05 NOTE — Patient Instructions (Signed)
 Skin biopsy for small fiber neuropathy Screening ultrasound of the blood flow to the legs If the above is negative, we will order MRI cervical spine

## 2024-05-12 ENCOUNTER — Ambulatory Visit
Admission: RE | Admit: 2024-05-12 | Discharge: 2024-05-12 | Disposition: A | Discharge status: Home / Self Care | Source: Ambulatory Visit | Attending: Neurology

## 2024-05-12 ENCOUNTER — Ambulatory Visit: Payer: Self-pay | Admitting: Neurology

## 2024-05-12 DIAGNOSIS — R200 Anesthesia of skin: Secondary | ICD-10-CM

## 2024-05-14 NOTE — Progress Notes (Signed)
 No PA required for Skin Biopsy. Medicare Part A&B and BCBS Supplemental.

## 2024-05-19 ENCOUNTER — Ambulatory Visit (INDEPENDENT_AMBULATORY_CARE_PROVIDER_SITE_OTHER): Admitting: Urology

## 2024-05-19 VITALS — BP 169/93 | HR 99 | Ht 63.5 in | Wt 218.0 lb

## 2024-05-19 DIAGNOSIS — N3941 Urge incontinence: Secondary | ICD-10-CM | POA: Diagnosis not present

## 2024-05-19 NOTE — Progress Notes (Signed)
 05/19/2024 8:20 AM   Brandy Oneill 10-Mar-1951 [id number]  Referring provider: Sadie Manna, MD [address] [facility] [place],  [place] [address]  No chief complaint on file.   HPI: Reviewed note. I last saw the patient in 2020.  She saw our nurse practitioner in April 2024.  The patient noted that in September 2020 her symptoms were well-controlled.  She noted in the last 6 months her incontinence got a lot worse even though she was on oxybutynin  twice a day sometimes augmented by 10 mg.  Then she said it did not work that well.  He was curious to wonder if the new device would work better.  She was given Gemtesa .  She has failed Myrbetriq .  We did a lot of troubleshooting prior to removing the device placed in 2013 in [place]    Patient still has urge incontinence in spite of Gemtesa .  Clinically not infected.  She would like to have the device removed since twice she is wanted an MRI.  Again the device was quite medial on the right side.   Patient had InterStim removed February 26, 2023   Patient is doing a lot better on Vesicare .  Clinically not infected.  Incisions look very good    Reassess durability of Vesicare  in 4 months. Botox is a distant option    Today Patient increase Vesicare  to 10 mg but is really not helping.  Still has urge incontinence.  Still has frequency.  Clinically not infected.  Patient had called in and I explained to her that she should not have another InterStim.  First device was placed properly and it never worked.  I went over percutaneous tibial nerve stimulation of Botox with full templates.  Handout given.  She would like to first try PTNS.  Proceed accordingly.  Today Urge incontinence persisting.  Failed PTNS.  Clinically not infected.  She wonders again about InterStim we talked about it why she should not have another 1.  Went over Botox with full template.  Frequency stable         PMH: Past Medical History:  Diagnosis  Date   Anxiety    Aortic atherosclerosis    Arthritis    CAD (coronary artery disease) 04/14/2020   a.) R/LHC 04/14/2020: mod CAD with sev obstructive disease in large dom RCA; staged PCI planned. b.) PCI 04/28/2020: DES (unknown type) placed to dRCA.   DDD (degenerative disc disease), lumbar    a.) s/p L3-L5 decompression 02/06/2022   Depression    Diastolic dysfunction 01/06/2019   a.) TTE 01/06/2019: EF >55%; triv panvalvular regurgitation; G1DD. 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 b.) TTE 01/26/2020: EF >55%; sev AS (MPG 47.1 mmHg). c.) RHC 04/14/2020: norm CI, PVR, RAP; PCWP 10. d.) s/p TAVR 05/11/2020. e.) TTE 06/01/2020: EF >55%; mild LVH; MPG  9 mmHg. f.) TTE 06/16/2021: EF >55%; mild LVH; GLS -11.6%; MPG 24 mmHg.   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   a.) 23 mm Edwards Sapien S3 bioprosthetic valve   Status post implantation of urinary electronic stimulator device 2013   a.) placed in [place]  back in 2013 after bladder was damaged from attempts to break up kidney stones per patient report; b.) device failed/stopped working just before [employer] (~ 2020); c.) plans for removal 02/2023   T2DM (type 2 diabetes mellitus) (HCC)     Tubular adenoma of colon    Varicose veins of both legs with edema     Surgical History: Past Surgical History:  Procedure Laterality Date   BLADDER STIMULATOR PLACEMENT  2013   Placed in [place]    CATARACT EXTRACTION W/ INTRAOCULAR LENS IMPLANT Bilateral 2014   CESAREAN SECTION     COLONOSCOPY WITH PROPOFOL  N/A 06/26/2018   Procedure: COLONOSCOPY WITH PROPOFOL ;  Surgeon: Toledo, Ladell POUR, MD;  Location: [facility];  Service: Gastroenterology;  Laterality: N/A;   INTERSTIM IMPLANT REMOVAL N/A 02/26/2023   Procedure: REMOVAL OF INTERSTIM IMPLANT;  Surgeon: Gaston Hamilton, MD;  Location: [facility];  Service: Urology;  Laterality: N/A;   JOINT REPLACEMENT Right 10/27/2016   JOINT REPLACEMENT Left 2014   KNEE CLOSED REDUCTION Right 11/29/2016   Procedure: CLOSED MANIPULATION KNEE;  Surgeon: Helayne Glenn, MD;  Location: [facility] OR[facility];  Service: Orthopedics;  Laterality: Right;   LUMBAR LAMINECTOMY/DECOMPRESSION MICRODISCECTOMY N/A 02/06/2022   Procedure: L3-5 DECOMPRESSION;  Surgeon: Clois Fret, MD;  Location: [facility] OR[facility];  Service: Neurosurgery;  Laterality: N/A;   PARS PLANA VITRECTOMY Right 2018   Procedure: VITRECTOMY, MECHANICAL, PARS PLANA APPROACH; WITH REMOVAL PRERETINAL CELLULAR MEMBRANE(EG, MACULAR PUCKER); Surgeon: Madison Concetta Hoof, MD; [facility]; Service: Ophthalmology   PERCUTANEOUS CORONARY STENT INTERVENTION (PCI-S)  04/28/2020   Procedure: PERCUTANEOUS CORONARY STENT INTERVENTION (PCI-S); Location: [facility]   RIGHT/LEFT HEART CATH AND CORONARY ANGIOGRAPHY Right 04/14/2020   Procedure: RIGHT/LEFT HEART CATH AND CORONARY ANGIOGRAPHY; Location: [facility]   TRANSCATHETER AORTIC VALVE REPLACEMENT, TRANSFEMORAL N/A 05/11/2020   ProcedureL TRANSCATHETER AORTIC VALVE REPLACEMENT, TRANSFEMORAL; Location: [facility]; Surgeon: DOROTHA Franky Minerva, MD    Home Medications:  Allergies as of 05/19/2024       Reactions   Kiwi Extract Anaphylaxis   Sulfa Antibiotics     Dizziness, thinks she went to the emergency room         Medication List        Accurate as of May 19, 2024  8:20 AM. If you have any questions, ask your nurse or doctor.          b complex vitamins capsule Take 1 capsule by mouth daily.   calcium carbonate 750 MG chewable tablet Commonly known as: TUMS EX Chew 1 tablet by mouth as needed for heartburn.   EPIPEN  IJ Inject as directed.   fexofenadine 180 MG tablet Commonly known as: ALLEGRA Take 180 mg by mouth as needed for allergies.   fluticasone  50 MCG/ACT nasal spray Commonly known as: FLONASE  as needed.   glimepiride 2 MG tablet Commonly known as: AMARYL Take 2 mg by mouth daily.   lisinopril  40 MG tablet Commonly known as: ZESTRIL  Take 40 mg by mouth at bedtime.   multivitamin with minerals Tabs tablet Take 1 tablet by mouth daily. Centrum for Women  pravastatin  10 MG tablet Commonly known as: PRAVACHOL  Take 10 mg by mouth every evening.   Rollator Ultra-Light Misc Light weight rollator walker with brakes  Needed for gait stability to help prevent falls.   solifenacin  10 MG tablet Commonly known as: VESICARE  Take 1 tablet (10 mg total) by mouth daily.   venlafaxine  37.5 MG tablet Commonly known as: EFFEXOR  Take 37.5 mg by mouth daily.        Allergies:  Allergies  Allergen Reactions   Kiwi Extract Anaphylaxis   Sulfa Antibiotics     Dizziness, thinks she went to the emergency room     Family History: Family History  Problem Relation Age of Onset   Bipolar disorder Mother    Alcoholism Father    Breast cancer Neg Hx     Social History:  reports that she quit smoking about 11 years ago. Her smoking use included cigarettes. She has never used smokeless tobacco. She reports current alcohol use. She reports that she does not use drugs.  ROS:                                        Physical Exam: There were no vitals taken for this visit.   Constitutional:  Alert and oriented, No acute distress. HEENT: [place] AT, moist mucus membranes.  Trachea midline, no masses.   Laboratory Data: Lab Results  Component Value Date   WBC 7.3 12/10/2020   HGB 14.4 12/10/2020   HCT 42.2 12/10/2020   MCV 89.6 12/10/2020   PLT 183 12/10/2020    Lab Results  Component Value Date   CREATININE 0.93 02/06/2022    No results found for: PSA  No results found for: TESTOSTERONE  No results found for: HGBA1C  Urinalysis    Component Value Date/Time   COLORURINE YELLOW (A) 01/27/2022 1144   APPEARANCEUR HAZY (A) 01/27/2022 1144   LABSPEC 1.014 01/27/2022 1144   PHURINE 5.0 01/27/2022 1144   GLUCOSEU NEGATIVE 01/27/2022 1144   HGBUR NEGATIVE 01/27/2022 1144   BILIRUBINUR NEGATIVE 01/27/2022 1144   KETONESUR NEGATIVE 01/27/2022 1144   PROTEINUR NEGATIVE 01/27/2022 1144   NITRITE NEGATIVE 01/27/2022 1144   LEUKOCYTESUR NEGATIVE 01/27/2022 1144    Pertinent Imaging:   Assessment & Plan: Patient would like to proceed with Botox.  I think she made a good choice for her.  She is bothered a lot by her urge incontinence.  Stay on solifenacin .  Call if urine culture positive  1. Urgency incontinence (Primary)    No follow-ups on file.  Glendia DELENA Elizabeth, MD  [facility] [address] [place], [place] [address] [phone]

## 2024-05-19 NOTE — Addendum Note (Signed)
 Addended byBETHA CORIE PLATER on: 05/19/2024 08:47 AM   Modules accepted: Orders

## 2024-05-22 MED ORDER — SOLIFENACIN SUCCINATE 10 MG PO TABS: 10.0000 mg | ORAL_TABLET | Freq: Every day | ORAL | 3 refills | Status: AC

## 2024-05-22 NOTE — Addendum Note (Signed)
 Addended byBETHA CORIE PLATER on: 05/22/2024 11:26 AM   Modules accepted: Orders

## 2024-06-03 ENCOUNTER — Ambulatory Visit (INDEPENDENT_AMBULATORY_CARE_PROVIDER_SITE_OTHER): Admitting: Neurology

## 2024-06-03 DIAGNOSIS — R202 Paresthesia of skin: Secondary | ICD-10-CM

## 2024-06-03 NOTE — Patient Instructions (Signed)
 INFORMATION FOR PATIENTS AFTER SKIN BIOPSY:   What You Need to Do:  1. Keep your current bandage on for 24 hours and do not shower during this time. Change your bandages every day starting tomorrow. Leave today's bandage on until AFTER your next shower. Keep the bandages on while you shower. Change them once a day until a scab forms.  2. You may take showers after 24 hours, but DO NOT take tub baths, go in hot tubs or go swimming for seven days after the procedure.  3. You may use vasoline, bacitracin, or Polysporin ointment on the wounds as needed.  4. If a scab forms at the biopsy site, leave it alone.  5. If bleeding occurs, apply firm pressure for two minutes with a clean piece of gauze.   Please contact us  immediately if there is any redness, any signs of infection, or significant bleeding at the biopsy site.   Please call our office at [phone].

## 2024-06-03 NOTE — Progress Notes (Signed)
 Punch Biopsy Procedure Note  Preprocedure Diagnosis: paresthesia of skin   Postprocedure Diagnosis: same  Locations: Site 1: left lateral distal leg;  Site 2: left lateral thigh;   Indications: r/o small fiber neuropathy  Anesthesia: 5 mL Lidocaine 1% with epinephrine  Procedure Details Patient informed of the risks (including but not limited to bleeding, pain, infection, scar and infection) and benefits of the procedure.  Informed consent obtained.  The areas which were chosen for biopsy, as above, and surrounding areas were given a sterile prep using alcohol and iodine. The skin was then stretched perpendicular to the skin tension lines and sample removed using the 3 mm punch. Pressure applied, hemostasis achieved.   Dressing applied. The specimen(s) was sent for pathologic examination. The patient tolerated the procedure well.  Estimated Blood Loss: 1 ml  Condition: Stable  Complications: none.  Plan: 1. Instructed to keep the wound dry and covered for 24h and clean thereafter. 2. Warning signs of infection were reviewed.    Jacquelyne Balint, MD [facility] Neurology

## 2024-06-06 ENCOUNTER — Other Ambulatory Visit: Payer: Self-pay | Admitting: Internal Medicine

## 2024-06-06 DIAGNOSIS — Z1231 Encounter for screening mammogram for malignant neoplasm of breast: Secondary | ICD-10-CM

## 2024-06-27 ENCOUNTER — Telehealth: Payer: Self-pay | Admitting: Neurology

## 2024-06-27 NOTE — Telephone Encounter (Signed)
 Please let pt know skin biopsy shows small fiber neuropathy, which can cause leg pain and burning sensation.  It does not typically cause numbness. We can set up follow-up visit to discuss.    Skin biopsy for small fiber neuropathy 06/03/2024:  Biopsy of the left calf shows significantly reduced epidermal nerve fiber density consistent with small fiber neuropathy.

## 2024-06-27 NOTE — Telephone Encounter (Signed)
 Called patient and informed her of results and recommendations. Patient is ok with having a follow up scheduled to discuss results and is aware someone from the front will give her a call. Patient verbalized understanding and had no further questions or concerns.

## 2024-06-29 ENCOUNTER — Encounter: Payer: Self-pay | Admitting: Neurology

## 2024-07-01 MED ORDER — GABAPENTIN 300 MG PO CAPS: 300.0000 mg | ORAL_CAPSULE | Freq: Every day | ORAL | 3 refills | Status: DC

## 2024-07-09 ENCOUNTER — Ambulatory Visit
Admission: RE | Admit: 2024-07-09 | Discharge: 2024-07-09 | Disposition: A | Discharge status: Home / Self Care | Source: Ambulatory Visit | Attending: Internal Medicine | Admitting: Internal Medicine

## 2024-07-09 DIAGNOSIS — Z1231 Encounter for screening mammogram for malignant neoplasm of breast: Secondary | ICD-10-CM | POA: Insufficient documentation

## 2024-07-11 ENCOUNTER — Encounter: Payer: Self-pay | Admitting: Neurology

## 2024-07-22 ENCOUNTER — Other Ambulatory Visit: Payer: Self-pay

## 2024-07-22 DIAGNOSIS — N3941 Urge incontinence: Secondary | ICD-10-CM

## 2024-07-28 ENCOUNTER — Other Ambulatory Visit

## 2024-07-28 ENCOUNTER — Telehealth: Payer: Self-pay

## 2024-07-28 NOTE — Telephone Encounter (Signed)
 Pt's called was transferred to me to provide pt with instructions for her upcoming Botox appointment. Pt was provided with the following:  Informed hr that a prescription for Cipro will be sent to her pharmacy. Instructions were provided that she will take the medication the day before the procedure, the day of the procedure, and the day after the procedure.  The patient was also reminded to arrive 30 minutes prior to their appointment so that we can instill a local anesthetic into the bladder and allow adequate time for it to take effect.  Patient voiced understanding.

## 2024-07-30 ENCOUNTER — Telehealth: Payer: Self-pay

## 2024-07-30 ENCOUNTER — Other Ambulatory Visit

## 2024-07-30 NOTE — Telephone Encounter (Signed)
 Pt LM on triage line- Was in office today to give urine sample. She was unable to void. She went home with a cup and was going to bring the specimen in later today. She has not been able to void and has developed diarrhea.   Pt aware ok to bring sample in the a.m. Appt made for 1030.

## 2024-07-31 ENCOUNTER — Other Ambulatory Visit

## 2024-08-04 ENCOUNTER — Encounter: Payer: Self-pay | Admitting: Urology

## 2024-08-04 DIAGNOSIS — R32 Unspecified urinary incontinence: Secondary | ICD-10-CM

## 2024-08-04 DIAGNOSIS — N3941 Urge incontinence: Secondary | ICD-10-CM

## 2024-08-05 ENCOUNTER — Ambulatory Visit: Admitting: Neurology

## 2024-08-05 ENCOUNTER — Telehealth: Payer: Self-pay

## 2024-08-05 VITALS — BP 106/66 | HR 88 | Ht 63.5 in | Wt 211.0 lb

## 2024-08-05 DIAGNOSIS — R2681 Unsteadiness on feet: Secondary | ICD-10-CM

## 2024-08-05 DIAGNOSIS — G629 Polyneuropathy, unspecified: Secondary | ICD-10-CM

## 2024-08-05 MED ORDER — GABAPENTIN 300 MG PO CAPS: 300.0000 mg | ORAL_CAPSULE | Freq: Every day | ORAL | 1 refills | Status: AC

## 2024-08-05 NOTE — Telephone Encounter (Signed)
 Auth Submission: NO AUTH NEEDED Site of care: Urology Payer: Medicare A/B with BCBS supplement Medication & CPT/J Code(s) submitted: Botox G9414 Diagnosis Code:  Route of submission (phone, fax, portal):  Phone # Fax # Auth type: Buy/Bill PB Units/visits requested: 100 units Reference number:  Approval from: 08/05/24 to 09/20/24

## 2024-08-05 NOTE — Progress Notes (Signed)
 Follow-up Visit   Date: 08/05/2024    Salem Mastrogiovanni MRN: [id number] DOB: [birth year]    Brandy Oneill is a 73 y.o. right-handed Caucasian female with  right-handed female with s/p L3-L5 decompression (2023), chronic venous insufficiency, hyperlipidemia, CAD cardiac stent, and s/p aortic valve replacement, prior tobacco use, and diabetes mellitus returning to the clinic for follow-up of small fiber neuropathy.  The patient was accompanied to the clinic by self.   IMPRESSION/PLAN: Small fiber neuropathy.  She has bilateral leg numbness.  Symptoms atypical due to numbness without pain or tingling.  MRI lumbar spine and thoracic spine does not show structural disease to cause leg numbness.  - Gabapentin  300mg  at bedtime - Discussed potential musculoskeletal issues causing imbalance and gait difficulty - Fall precautions discussed.  History of back surgery with residual symptoms.  Residual symptoms post-surgery include balance issues and leg discomfort, possibly contributing to current balance problems.  - Handicap placard form completed  Return to clinic in 1 year  --------------------------------------------- History of present illness: She underwent lumbar surgery in 2023 and reports having residual numbness over the side of the legs.  Starting around May 2025, she began having numbness involving both legs from the knee down into the feet.  Symptoms are constant and worse when she is sitting.  She also reports having difficulty with balance.  She walks with a cane.  MRI lumbar spine shows mild multilevel degenerative changes, no severe nerve impingement.  NCS/EMG of the legs did not show large fiber neuropathy, there is mild right L5 radiculopathy.     She lives at home with husband in a 2-level home.  She does not smoke and rarely drinks alcohol  UPDATE 08/05/2024:   Discussed the use of AI scribe software for clinical note transcription with the patient, who gave verbal  consent to proceed.  She experiences persistent numbness in her legs, specifically from her knees to her feet and lower legs. The numbness has remained unchanged in location and severity since her last visit. She has a sensation of falling and believes her depth perception might be off, although she has not had any recent falls. She is requesting handicap placard.   She is currently taking gabapentin  300mg  at bedtime but is unsure if it helps as she does not experience significant pain. Her legs feel stiff after sitting for long periods and sometimes throb, but she does not describe this as joint pain. She has a history of back surgery, after which she has experienced balance issues and her legs have 'never been the same'.  Her blood work shows normal B12 and folate levels. Diabetes is well-controlled.  She is concerned about her balance issues, which she attributes to her back surgery rather than her neuropathy.    Medications:  Medications Ordered Prior to Encounter[1]  Allergies: Allergies[2]  Vital Signs:  BP 106/66   Pulse 88   Ht 5' 3.5 (1.613 m)   Wt 211 lb (95.7 kg)   SpO2 97%   BMI 36.79 kg/m    Neurological Exam: MENTAL STATUS including orientation to time, place, person, recent and remote memory, attention span and concentration, language, and fund of knowledge is normal.  Speech is not dysarthric.  CRANIAL NERVES:   Pupils equal round and reactive to light.  Normal conjugate, extra-ocular eye movements in all directions of gaze.  No ptosis.  Face is symmetric.  MOTOR:  Motor strength is 5/5 in all extremities.  No atrophy, fasciculations or abnormal movements.  No  pronator drift.  Tone is normal.    MSRs:  Reflexes are 2+/4 throughout, except absent at the ankles bilaterally.  SENSORY:  Intact to vibration reduced below the ankles.  Temperature reduced distally.  Rhomberg sign absent. SABRA   COORDINATION/GAIT:  Normal finger-to- nose-finger.  Intact rapid alternating  movements bilaterally.  Gait appears antalgic, stooped, flexed at the knees and hips.  Assisted with cane, as needed.   Data:  Skin biopsy for small fiber neuropathy 06/03/2024:  Biopsy of the left calf shows significantly reduced epidermal nerve fiber density consistent with small fiber neuropathy.  NCS/EMG of the legs 03/20/2024:   Chronic L5 radiculopathy affecting the right lower extremity, mild. There is no evidence of a large fiber sensorimotor polyneuropathy affecting the lower extremities.   MRI lumbar spine wo contrast 02/11/2024: 1. Interval laminectomies at L3-4 and L4-5 without significant residual spinal stenosis. 2. Unchanged mild spinal stenosis at L2-3. 3. Unchanged mild multilevel neural foraminal stenosis. 4. 6 mm intradural nodule at L1-2 which may reflect a small nerve sheath tumor.   MRI thoracic spine wo contrast 02/11/2024:  Mild thoracic disc degeneration without stenosis.     Thank you for allowing me to participate in patient's care.  If I can answer any additional questions, I would be pleased to do so.    Sincerely,    Sabriya Yono K. Tobie, DO      [1]  Current Outpatient Medications on File Prior to Visit  Medication Sig Dispense Refill   b complex vitamins capsule Take 1 capsule by mouth daily.     calcium carbonate (TUMS EX) 750 MG chewable tablet Chew 1 tablet by mouth as needed for heartburn.     EPINEPHrine  (EPIPEN  IJ) Inject as directed.     fexofenadine (ALLEGRA) 180 MG tablet Take 180 mg by mouth as needed for allergies.     fluticasone  (FLONASE ) 50 MCG/ACT nasal spray as needed.  3   glimepiride (AMARYL) 2 MG tablet Take 2 mg by mouth daily.     lisinopril  (PRINIVIL ,ZESTRIL ) 40 MG tablet Take 40 mg by mouth at bedtime.     Misc. Devices (ROLLATOR ULTRA-LIGHT) MISC Light weight rollator walker with brakes  Needed for gait stability to help prevent falls. 1 each 0   Multiple Vitamin (MULTIVITAMIN WITH MINERALS) TABS tablet Take 1 tablet by  mouth daily. Centrum for Women     pravastatin  (PRAVACHOL ) 10 MG tablet Take 10 mg by mouth every evening.     solifenacin  (VESICARE ) 10 MG tablet Take 1 tablet (10 mg total) by mouth daily. 90 tablet 3   venlafaxine  (EFFEXOR ) 37.5 MG tablet Take 37.5 mg by mouth daily.     No current facility-administered medications on file prior to visit.  [2]  Allergies Allergen Reactions   Kiwi Extract Anaphylaxis   Sulfa Antibiotics     Dizziness, thinks she went to the emergency room

## 2024-08-06 ENCOUNTER — Other Ambulatory Visit

## 2024-08-06 DIAGNOSIS — N3941 Urge incontinence: Secondary | ICD-10-CM

## 2024-08-07 LAB — MICROSCOPIC EXAMINATION: Epithelial Cells (non renal): >10 /HPF — AB (ref 0–10)

## 2024-08-07 LAB — URINALYSIS, COMPLETE
Bilirubin, UA: NEGATIVE
Glucose, UA: NEGATIVE
Ketones, UA: NEGATIVE
Leukocytes,UA: NEGATIVE
Nitrite, UA: NEGATIVE
Protein,UA: NEGATIVE
RBC, UA: NEGATIVE
Specific Gravity, UA: 1.025 (ref 1.005–1.030)
Urobilinogen, Ur: 0.2 mg/dL (ref 0.2–1.0)
pH, UA: 5.5 (ref 5.0–7.5)

## 2024-08-09 LAB — CULTURE, URINE COMPREHENSIVE

## 2024-08-11 ENCOUNTER — Ambulatory Visit: Admitting: Urology

## 2024-08-11 NOTE — Telephone Encounter (Signed)
 2nd attempt to reach pt, LVM for pt to return call

## 2024-08-11 NOTE — Progress Notes (Deleted)
 LVM for pt to return call to discuss antibiotics for upcoming botox procedure.

## 2024-08-11 NOTE — Telephone Encounter (Signed)
 Attempted to contact pt's cardiologist, LVM for triage nurse to return call. Per Dr.MacDiarmid,he wanted to clarify whether the amoxcicllin should be taken as a four-day course or as four tablets taken 30-60 minutes before the procedure.

## 2024-08-11 NOTE — Addendum Note (Signed)
 Addended byBETHA CORIE PLATER on: 08/11/2024 04:36 PM   Modules accepted: Orders

## 2024-08-11 NOTE — Telephone Encounter (Signed)
 LVM for pt to return call to discuss antibiotics for upcoming botox procedure.

## 2024-08-12 MED ORDER — CIPROFLOXACIN HCL 500 MG PO TABS: 500.0000 mg | ORAL_TABLET | Freq: Every day | ORAL | 0 refills | Status: AC

## 2024-08-12 MED ORDER — AMOXICILLIN 500 MG PO TABS: ORAL_TABLET | ORAL | 0 refills | Status: AC

## 2024-08-12 NOTE — Addendum Note (Signed)
 Addended by: GENITA HARLENE CROME on: 08/12/2024 07:45 AM   Modules accepted: Orders

## 2024-08-18 ENCOUNTER — Ambulatory Visit: Admitting: Urology

## 2024-08-18 VITALS — BP 174/82 | HR 91

## 2024-08-18 DIAGNOSIS — N3941 Urge incontinence: Secondary | ICD-10-CM | POA: Diagnosis not present

## 2024-08-18 MED ORDER — ONABOTULINUMTOXINA 100 UNITS IJ SOLR
100.0000 [IU] | Freq: Once | INTRAMUSCULAR | Status: AC
  Administered 2024-08-18: 100 [IU] via INTRAMUSCULAR

## 2024-08-18 NOTE — Progress Notes (Signed)
 "  08/18/2024 11:08 AM   Brandy Oneill Oct 23, 1950 [id number]  Referring provider: Sadie Manna, MD [address] [facility] [place],  [place] [address]  No chief complaint on file.   HPI: Reviewed note. I last saw the patient in 2020.  She saw our nurse practitioner in April 2024.  The patient noted that in September 2020 her symptoms were well-controlled.  She noted in the last 6 months her incontinence got a lot worse even though she was on oxybutynin  twice a day sometimes augmented by 10 mg.  Then she said it did not work that well.  He was curious to wonder if the new device would work better.  She was given Gemtesa .  She has failed Myrbetriq .  We did a lot of troubleshooting prior to removing the device placed in 2013 in [place]    Patient still has urge incontinence in spite of Gemtesa .  Clinically not infected.  She would like to have the device removed since twice she is wanted an MRI.  Again the device was quite medial on the right side.   Patient had InterStim removed February 26, 2023   Patient is doing a lot better on Vesicare .  Clinically not infected.  Incisions look very good    Reassess durability of Vesicare  in 4 months. Botox  is a distant option    Today Patient increase Vesicare  to 10 mg but is really not helping.  Still has urge incontinence.  Still has frequency.  Clinically not infected.  Patient had called in and I explained to her that she should not have another InterStim.  First device was placed properly and it never worked.  I went over percutaneous tibial nerve stimulation of Botox  with full templates.  Handout given.  She would like to first try PTNS.  Proceed accordingly.   Today Urge incontinence persisting.  Failed PTNS.  Clinically not infected.  She wonders again about InterStim we talked about it why she should not have another 1.  Went over Botox  with full template.  Frequency stable  Patient would like to proceed with Botox . I think  she made a good choice for her. She is bothered a lot by her urge incontinence. Stay on solifenacin . Call if urine culture positive   August 18, 2024 Patient has heart valve and we had called in amoxicillin  2 g as well as a ciprofloxacin .  Recent urine culture negative Patient had Botox .  Bladder mucosa and trigone were normal.  She did have moderately severe bladder trabeculation but no cystitis.  I injected 100 units of Botox  and 10 cc of normal saline with a more modified midline template.  Little to no bleeding.  Approximately 4 the injections were quite tender but overall she did well.   PMH: Past Medical History:  Diagnosis Date   Anxiety    Aortic atherosclerosis    Arthritis    CAD (coronary artery disease) 04/14/2020   a.) R/LHC 04/14/2020: mod CAD with sev obstructive disease in large dom RCA; staged PCI planned. b.) PCI 04/28/2020: DES (unknown type) placed to dRCA.   DDD (degenerative disc disease), lumbar    a.) s/p L3-L5 decompression 02/06/2022   Depression    Diastolic dysfunction 01/06/2019   a.) TTE 01/06/2019: EF >55%; triv panvalvular regurgitation; G1DD. 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 b.) TTE 01/26/2020: EF >55%; sev AS (MPG 47.1 mmHg). c.) RHC 04/14/2020: norm CI, PVR, RAP; PCWP 10. d.) s/p TAVR 05/11/2020. e.) TTE 06/01/2020: EF >55%; mild LVH; MPG 9 mmHg. f.) TTE  06/16/2021: EF >55%; mild LVH; GLS -11.6%; MPG 24 mmHg.   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   a.) 23 mm Edwards Sapien S3 bioprosthetic valve   Status post implantation of urinary electronic stimulator device 2013   a.) placed in [place]  back in 2013 after bladder was damaged from attempts to break up kidney stones per patient report; b.) device failed/stopped working just before [employer] (~ 2020); c.) plans for removal 02/2023   T2DM (type 2 diabetes mellitus) (HCC)    Tubular adenoma of colon    Varicose veins of both legs with edema     Surgical History: Past Surgical History:  Procedure Laterality Date   BLADDER STIMULATOR PLACEMENT  2013   Placed in [place]    CATARACT EXTRACTION W/ INTRAOCULAR LENS IMPLANT Bilateral 2014   CESAREAN SECTION     COLONOSCOPY WITH PROPOFOL  N/A 06/26/2018   Procedure: COLONOSCOPY WITH PROPOFOL ;  Surgeon: Toledo, Ladell POUR, MD;  Location: [facility];  Service: Gastroenterology;  Laterality: N/A;   INTERSTIM IMPLANT REMOVAL N/A 02/26/2023   Procedure: REMOVAL OF INTERSTIM IMPLANT;  Surgeon: Gaston Hamilton, MD;  Location: [facility];  Service: Urology;  Laterality: N/A;   JOINT REPLACEMENT Right 10/27/2016   JOINT REPLACEMENT Left 2014   KNEE CLOSED REDUCTION Right 11/29/2016   Procedure: CLOSED MANIPULATION KNEE;  Surgeon: Helayne Glenn, MD;  Location: [facility] OR[facility];  Service: Orthopedics;  Laterality: Right;   LUMBAR LAMINECTOMY/DECOMPRESSION MICRODISCECTOMY N/A 02/06/2022   Procedure: L3-5 DECOMPRESSION;  Surgeon: Clois Fret, MD;  Location: [facility] OR[facility];  Service: Neurosurgery;  Laterality: N/A;   PARS PLANA VITRECTOMY Right 2018   Procedure: VITRECTOMY, MECHANICAL, PARS PLANA APPROACH; WITH REMOVAL PRERETINAL CELLULAR MEMBRANE(EG, MACULAR PUCKER); Surgeon: Madison Concetta Hoof, MD;  [facility]; Service: Ophthalmology   PERCUTANEOUS CORONARY STENT INTERVENTION (PCI-S)  04/28/2020   Procedure: PERCUTANEOUS CORONARY STENT INTERVENTION (PCI-S); Location: [facility]   RIGHT/LEFT HEART CATH AND CORONARY ANGIOGRAPHY Right 04/14/2020   Procedure: RIGHT/LEFT HEART CATH AND CORONARY ANGIOGRAPHY; Location: [facility]   TRANSCATHETER AORTIC VALVE REPLACEMENT, TRANSFEMORAL N/A 05/11/2020   ProcedureL TRANSCATHETER AORTIC VALVE REPLACEMENT, TRANSFEMORAL; Location: [facility]; Surgeon: DOROTHA Franky Minerva, MD    Home Medications:  Allergies as of 08/18/2024       Reactions   Kiwi Extract Anaphylaxis   Sulfa Antibiotics    Dizziness, thinks she went to the emergency room         Medication List        Accurate as of August 18, 2024 11:08 AM. If you have any questions, ask your nurse or doctor.          amoxicillin  500 MG tablet Commonly known as: AMOXIL   Take  four tablets taken 30-60 minutes before the procedure.   b complex vitamins capsule Take 1 capsule by mouth daily.   calcium carbonate 750 MG chewable tablet Commonly known as: TUMS EX Chew 1 tablet by mouth as needed for heartburn.   EPIPEN  IJ Inject as directed.   fexofenadine 180 MG tablet Commonly known as: ALLEGRA Take 180 mg by mouth as needed for allergies.   fluticasone  50 MCG/ACT nasal spray Commonly known as: FLONASE  as needed.   gabapentin  300 MG capsule Commonly known as: NEURONTIN  Take 1 capsule (300 mg total) by mouth at bedtime.   glimepiride 2 MG tablet Commonly known as: AMARYL Take 2 mg by mouth daily.   lisinopril  40 MG tablet Commonly known as: ZESTRIL  Take 40 mg by mouth at bedtime.   multivitamin with minerals Tabs tablet Take 1 tablet by mouth daily. Centrum for Women   pravastatin  10 MG tablet Commonly known as: PRAVACHOL  Take 10 mg by mouth every evening.   Rollator Ultra-Light Misc Light weight rollator walker with brakes  Needed for gait stability to  help prevent falls.   solifenacin  10 MG tablet Commonly known as: VESICARE  Take 1 tablet (10 mg total) by mouth daily.   venlafaxine  37.5 MG tablet Commonly known as: EFFEXOR  Take 37.5 mg by mouth daily.        Allergies: Allergies[1]  Family History: Family History  Problem Relation Age of Onset   Bipolar disorder Mother    Alcoholism Father    Breast cancer Neg Hx     Social History:  reports that she quit smoking about 11 years ago. Her smoking use included cigarettes. She has never used smokeless tobacco. She reports current alcohol use. She reports that she does not use drugs.  ROS:                                        Physical Exam: There were no vitals taken for this visit.  Constitutional:  Alert and oriented, No acute distress. HEENT: [place] AT, moist mucus membranes.  Trachea midline, no masses.   Laboratory Data: Lab Results  Component Value Date   WBC 7.3 12/10/2020   HGB 14.4 12/10/2020   HCT 42.2 12/10/2020   MCV 89.6 12/10/2020   PLT 183 12/10/2020    Lab Results  Component Value Date   CREATININE 0.93 02/06/2022    No results found for: PSA  No results found for: TESTOSTERONE  No results found for: HGBA1C  Urinalysis    Component Value Date/Time   COLORURINE YELLOW (A) 01/27/2022 1144   APPEARANCEUR Clear 08/06/2024 1052   LABSPEC 1.014 01/27/2022 1144   PHURINE 5.0 01/27/2022 1144   GLUCOSEU Negative 08/06/2024 1052   HGBUR NEGATIVE 01/27/2022 1144   BILIRUBINUR Negative 08/06/2024 1052   KETONESUR NEGATIVE 01/27/2022 1144   PROTEINUR Negative 08/06/2024 1052   PROTEINUR NEGATIVE 01/27/2022 1144   NITRITE Negative 08/06/2024 1052   NITRITE NEGATIVE 01/27/2022 1144   LEUKOCYTESUR Negative 08/06/2024 1052   LEUKOCYTESUR NEGATIVE 01/27/2022 1144    Pertinent Imaging:   Assessment & Plan: Stay on Vesicare .  Follow-up as per protocol  There are no diagnoses linked to this encounter.  No  follow-ups on file.  Glendia DELENA Elizabeth, MD  [facility] [address], [address] [place], [place] [address] [phone]     [1]  Allergies Allergen Reactions   Kiwi Extract  Anaphylaxis   Sulfa Antibiotics     Dizziness, thinks she went to the emergency room    "

## 2024-08-18 NOTE — Addendum Note (Signed)
 Addended byBETHA CORIE PLATER on: 08/18/2024 01:19 PM   Modules accepted: Orders

## 2024-08-18 NOTE — Progress Notes (Signed)
 Bladder Instillation  Due to Botox  Injection patient is present today for a Bladder Instillation of 2% Lidocaine . Patient was cleaned and prepped in a sterile fashion with betadine and lidocaine  2% jelly was instilled into the urethra.  A 14FR Coude catheter was inserted, urine return was noted , urine was yellow in color.  60 ml was instilled into the bladder. The catheter was then removed. Patient tolerated well, no complications were noted. Patient held in bladder for 30 minutes prior to procedure starting.   Performed by: Beauford Browner, CCMA  Follow up/ Additional notes:  2 week follow-Up with PA

## 2024-09-02 ENCOUNTER — Encounter: Payer: Self-pay | Admitting: Physician Assistant

## 2024-09-02 ENCOUNTER — Ambulatory Visit: Admitting: Physician Assistant

## 2024-09-09 ENCOUNTER — Ambulatory Visit (INDEPENDENT_AMBULATORY_CARE_PROVIDER_SITE_OTHER): Admitting: Physician Assistant

## 2024-09-09 VITALS — BP 106/68 | HR 99 | Ht 63.5 in | Wt 208.2 lb

## 2024-09-09 DIAGNOSIS — N3941 Urge incontinence: Secondary | ICD-10-CM

## 2024-09-09 LAB — BLADDER SCAN AMB NON-IMAGING

## 2024-09-09 NOTE — Progress Notes (Signed)
 "  09/09/2024 2:39 PM   Brandy Oneill 10/09/1950 [id number]  CC: Chief Complaint  Patient presents with   Urinary Incontinence   HPI: Brandy Oneill is a 74 y.o. female with PMH refractory OAB who failed PTNS and InterStim on Vesicare  and who underwent intravesical Botox  with Dr. MacDiarmid on 08/18/2024 who presents today for post Botox  check.   Today she reports her voiding symptoms are somewhat better after Botox .  She now feels when she needs to urinate instead of having leakage without awareness.  When she feels the urge to go, she often has time to make it to the bathroom.  She still has occasional gushes when she stands up.  She denies dysuria or abdominal discomfort.  She was unable to provide a urine specimen today, and bladder scan shows 352 mL; last void approximately 2 hours ago.  She remains on Vesicare  and took 2 doses last night.  She wonders if there are any other treatment options available for her OAB.  PMH: Past Medical History:  Diagnosis Date   Anxiety    Aortic atherosclerosis    Arthritis    CAD (coronary artery disease) 04/14/2020   a.) R/LHC 04/14/2020: mod CAD with sev obstructive disease in large dom RCA; staged PCI planned. b.) PCI 04/28/2020: DES (unknown type) placed to dRCA.   DDD (degenerative disc disease), lumbar    a.) s/p L3-L5 decompression 02/06/2022   Depression    Diastolic dysfunction 01/06/2019   a.) TTE 01/06/2019: EF >55%; triv panvalvular regurgitation; G1DD. 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 b.) TTE 01/26/2020: EF >55%; sev AS (MPG 47.1 mmHg). c.) RHC 04/14/2020: norm CI, PVR, RAP; PCWP 10. d.) s/p TAVR 05/11/2020. e.) TTE 06/01/2020: EF >55%; mild LVH; MPG 9 mmHg. f.) TTE 06/16/2021: EF >55%; mild LVH; GLS -11.6%; MPG 24 mmHg.   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   a.) 23 mm Edwards Sapien S3 bioprosthetic valve   Status post implantation of urinary electronic stimulator device 2013   a.) placed in [place]  back in 2013 after bladder was damaged from attempts to break up kidney stones per patient report; b.) device failed/stopped working just before [employer] (~ 2020); c.) plans for removal 02/2023   T2DM (type 2 diabetes mellitus) (HCC)    Tubular adenoma of colon    Varicose veins of both legs with edema     Surgical History: Past Surgical History:  Procedure Laterality Date   BLADDER STIMULATOR PLACEMENT  2013   Placed in [place]    CATARACT EXTRACTION W/ INTRAOCULAR LENS IMPLANT Bilateral 2014   CESAREAN SECTION     COLONOSCOPY WITH PROPOFOL  N/A 06/26/2018   Procedure: COLONOSCOPY WITH PROPOFOL ;  Surgeon: Toledo, Ladell POUR, MD;  Location: [facility];  Service: Gastroenterology;  Laterality: N/A;   INTERSTIM IMPLANT REMOVAL N/A 02/26/2023   Procedure: REMOVAL OF INTERSTIM IMPLANT;  Surgeon: Gaston Hamilton, MD;  Location: [facility];  Service: Urology;  Laterality: N/A;   JOINT REPLACEMENT Right 10/27/2016   JOINT REPLACEMENT Left 2014   KNEE CLOSED REDUCTION Right 11/29/2016   Procedure: CLOSED MANIPULATION KNEE;  Surgeon: Helayne Glenn, MD;  Location: [facility];  Service: Orthopedics;  Laterality: Right;   LUMBAR LAMINECTOMY/DECOMPRESSION  MICRODISCECTOMY N/A 02/06/2022   Procedure: L3-5 DECOMPRESSION;  Surgeon: Clois Fret, MD;  Location: [facility];  Service: Neurosurgery;  Laterality: N/A;   PARS PLANA VITRECTOMY Right 2018   Procedure: VITRECTOMY, MECHANICAL, PARS PLANA APPROACH; WITH REMOVAL PRERETINAL CELLULAR MEMBRANE(EG, MACULAR PUCKER); Surgeon: Madison Concetta Hoof, MD; Location[facility]; Service: Ophthalmology   PERCUTANEOUS CORONARY STENT INTERVENTION (PCI-S)  04/28/2020   Procedure: PERCUTANEOUS CORONARY STENT INTERVENTION (PCI-S); Location: [facility]   RIGHT/LEFT HEART CATH AND CORONARY ANGIOGRAPHY Right 04/14/2020   Procedure: RIGHT/LEFT HEART CATH AND CORONARY ANGIOGRAPHY; Location: [facility]   TRANSCATHETER AORTIC VALVE REPLACEMENT, TRANSFEMORAL N/A 05/11/2020   ProcedureL TRANSCATHETER AORTIC VALVE REPLACEMENT, TRANSFEMORAL; Location: [facility]; Surgeon: DOROTHA Franky Minerva, MD    Home Medications:  Allergies as of 09/09/2024       Reactions   Kiwi Extract Anaphylaxis   Sulfa Antibiotics    Dizziness, thinks she went to the emergency room         Medication List        Accurate as of September 09, 2024  2:39 PM. If you have any questions, ask your nurse or doctor.          STOP taking these medications    fexofenadine 180 MG tablet Commonly known as: ALLEGRA       TAKE these medications    amoxicillin  500 MG tablet Commonly known as: AMOXIL  Take  four tablets taken 30-60 minutes before the procedure.   amoxicillin -clavulanate 875-125 MG tablet Commonly known as: AUGMENTIN SMARTSIG:1 Tablet(s) By Mouth Every 12 Hours   azithromycin  250 MG tablet Commonly known as: ZITHROMAX  TK 2 TS PO ON DAY 1, THEN TK 1 T PO D FOR 4 DAYS   b complex vitamins capsule Take 1 capsule by mouth daily.   calcium carbonate 750 MG chewable tablet Commonly known as: TUMS EX Chew 1 tablet by mouth as needed for heartburn.   Clobetasol  Propionate E 0.05 % emollient cream Generic drug: Clobetasol  Prop  Emollient Base Apply topically 2 (two) times daily.   EPIPEN  IJ Inject as directed.   fluticasone  50 MCG/ACT nasal spray Commonly known as: FLONASE  as needed.   gabapentin  300 MG capsule Commonly known as: NEURONTIN  Take 1 capsule (300 mg total) by mouth at bedtime.   glimepiride 2 MG tablet Commonly known as: AMARYL Take 2 mg by mouth.   lisinopril  40 MG tablet Commonly known as: ZESTRIL  Take 40 mg by mouth daily.   Mounjaro 2.5 MG/0.5ML Pen Generic drug: tirzepatide SMARTSIG:2.5 Milligram(s) SUB-Q Once a Week   multivitamin with minerals Tabs tablet Take 1 tablet by mouth daily. Centrum for Women   pravastatin  10 MG tablet Commonly known as: PRAVACHOL  Take 10 mg by mouth every evening.   Rollator Ultra-Light Misc Light weight rollator walker with brakes  Needed for gait stability to help prevent falls.   solifenacin  10 MG tablet Commonly known as: VESICARE  Take 1 tablet (10 mg total) by mouth daily.   venlafaxine  XR 150 MG  24 hr capsule Commonly known as: EFFEXOR -XR Take 150 mg by mouth daily.        Allergies:  Allergies[1]  Family History: Family History  Problem Relation Age of Onset   Bipolar disorder Mother    Alcoholism Father    Breast cancer Neg Hx     Social History:   reports that she quit smoking about 11 years ago. Her smoking use included cigarettes. She has never used smokeless tobacco. She reports current alcohol use. She reports that she does not use drugs.  Physical Exam: BP 106/68   Pulse 99   Ht 5' 3.5 (1.613 m)   Wt 208 lb 3.2 oz (94.4 kg)   BMI 36.30 kg/m   Constitutional:  Alert and oriented, no acute distress, nontoxic appearing HEENT: [place], AT Cardiovascular: No clubbing, cyanosis, or edema Respiratory: Normal respiratory effort, no increased work of breathing Skin: No rashes, bruises or suspicious lesions Neurologic: Grossly intact, no focal deficits, moving all 4 extremities Psychiatric: Normal mood and  affect  Laboratory Data: Results for orders placed or performed in visit on 09/09/24  Bladder Scan (Post Void Residual) in office   Collection Time: 09/09/24 10:28 AM  Result Value Ref Range   Scan Result    Assessment & Plan:   1. Urgency incontinence (Primary) Some improvement in her OAB symptoms after intravesical Botox , though she has some incomplete emptying today.  Fortunately, she is comfortable and is not clinically infected.  Will check a BMP today to make sure her renal function is stable.  We discussed that if there is evidence of upper tract involvement or if she develops discomfort or recurrent UTIs, we will need to consider CIC.  She prefers to defer this for now, which is reasonable.  She prefers to plan ahead for her next treatment in 6 months.  We discussed that there are no other nonpharmacologic treatment options for OAB at this time since she has already failed PTNS and InterStim - Bladder Scan (Post Void Residual) in office - Basic metabolic panel   Return in about 6 months (around 03/09/2025) for Botox  with Dr. MacDiarmid.  Lucie Hones, PA-C  [facility]  [address], [address] [place], [place] [address] [phone]     [1]  Allergies Allergen Reactions   Kiwi Extract Anaphylaxis   Sulfa Antibiotics     Dizziness, thinks she went to the emergency room    "

## 2024-09-10 ENCOUNTER — Ambulatory Visit: Payer: Self-pay | Admitting: Physician Assistant

## 2024-09-10 DIAGNOSIS — N179 Acute kidney failure, unspecified: Secondary | ICD-10-CM

## 2024-09-10 LAB — BASIC METABOLIC PANEL WITH GFR
BUN/Creatinine Ratio: 18 (ref 12–28)
BUN: 21 mg/dL (ref 8–27)
CO2: 21 mmol/L (ref 20–29)
Calcium: 9.6 mg/dL (ref 8.7–10.3)
Chloride: 104 mmol/L (ref 96–106)
Creatinine, Ser: 1.16 mg/dL — ABNORMAL HIGH (ref 0.57–1.00)
Glucose: 91 mg/dL (ref 70–99)
Potassium: 4.9 mmol/L (ref 3.5–5.2)
Sodium: 143 mmol/L (ref 134–144)
eGFR: 49 mL/min/1.73 — ABNORMAL LOW

## 2024-09-10 NOTE — Progress Notes (Signed)
 Her kidney function has worsened, possibly related to her full bladder. Please have her get a renal ultrasound in the next week.

## 2024-09-16 NOTE — Addendum Note (Signed)
 Addended by: MAURINE SHEPPARD SQUIBB on: 09/16/2024 12:49 PM   Modules accepted: Orders

## 2024-09-16 NOTE — Telephone Encounter (Signed)
 I was able to put it in just now.

## 2024-09-23 ENCOUNTER — Ambulatory Visit

## 2024-09-29 ENCOUNTER — Ambulatory Visit
